# Patient Record
Sex: Female | Born: 1955 | Race: Black or African American | Hispanic: No | Marital: Married | Smoking: Never smoker
Health system: Southern US, Academic
[De-identification: ages and names within clinical notes are randomized; demographics above are authoritative.]

## PROBLEM LIST (undated history)

## (undated) DIAGNOSIS — G4733 Obstructive sleep apnea (adult) (pediatric): Secondary | ICD-10-CM

## (undated) DIAGNOSIS — I872 Venous insufficiency (chronic) (peripheral): Secondary | ICD-10-CM

## (undated) DIAGNOSIS — I38 Endocarditis, valve unspecified: Secondary | ICD-10-CM

## (undated) HISTORY — DX: Obstructive sleep apnea (adult) (pediatric): G47.33

## (undated) HISTORY — PX: HX CARPAL TUNNEL RELEASE: SHX101

## (undated) HISTORY — DX: Endocarditis, valve unspecified: I38

## (undated) HISTORY — PX: HX HERNIA REPAIR: SHX51

## (undated) HISTORY — PX: HX KNEE REPLACMENT: SHX125

## (undated) HISTORY — DX: Venous insufficiency (chronic) (peripheral): I87.2

## (undated) HISTORY — PX: COLONOSCOPY: SHX174

---

## 1996-01-29 ENCOUNTER — Other Ambulatory Visit (HOSPITAL_COMMUNITY): Payer: Self-pay

## 2009-11-07 DIAGNOSIS — R002 Palpitations: Secondary | ICD-10-CM | POA: Insufficient documentation

## 2009-11-07 DIAGNOSIS — I493 Ventricular premature depolarization: Secondary | ICD-10-CM | POA: Insufficient documentation

## 2017-01-09 DIAGNOSIS — E785 Hyperlipidemia, unspecified: Secondary | ICD-10-CM | POA: Insufficient documentation

## 2017-01-09 DIAGNOSIS — N83209 Unspecified ovarian cyst, unspecified side: Secondary | ICD-10-CM | POA: Insufficient documentation

## 2017-01-09 DIAGNOSIS — M5136 Other intervertebral disc degeneration, lumbar region: Secondary | ICD-10-CM | POA: Insufficient documentation

## 2017-01-09 DIAGNOSIS — G5760 Lesion of plantar nerve, unspecified lower limb: Secondary | ICD-10-CM | POA: Insufficient documentation

## 2017-01-09 DIAGNOSIS — M75102 Unspecified rotator cuff tear or rupture of left shoulder, not specified as traumatic: Secondary | ICD-10-CM | POA: Insufficient documentation

## 2017-01-09 DIAGNOSIS — R739 Hyperglycemia, unspecified: Secondary | ICD-10-CM | POA: Insufficient documentation

## 2017-01-09 DIAGNOSIS — M75101 Unspecified rotator cuff tear or rupture of right shoulder, not specified as traumatic: Secondary | ICD-10-CM | POA: Insufficient documentation

## 2018-06-03 DIAGNOSIS — M170 Bilateral primary osteoarthritis of knee: Secondary | ICD-10-CM | POA: Insufficient documentation

## 2019-02-04 DIAGNOSIS — G4733 Obstructive sleep apnea (adult) (pediatric): Secondary | ICD-10-CM | POA: Insufficient documentation

## 2019-02-04 DIAGNOSIS — G4730 Sleep apnea, unspecified: Secondary | ICD-10-CM | POA: Insufficient documentation

## 2019-06-24 DIAGNOSIS — M542 Cervicalgia: Secondary | ICD-10-CM | POA: Insufficient documentation

## 2019-06-24 DIAGNOSIS — M7540 Impingement syndrome of unspecified shoulder: Secondary | ICD-10-CM | POA: Insufficient documentation

## 2019-06-24 DIAGNOSIS — G8929 Other chronic pain: Secondary | ICD-10-CM | POA: Insufficient documentation

## 2019-06-24 DIAGNOSIS — M4722 Other spondylosis with radiculopathy, cervical region: Secondary | ICD-10-CM | POA: Insufficient documentation

## 2019-06-24 DIAGNOSIS — Z96652 Presence of left artificial knee joint: Secondary | ICD-10-CM | POA: Insufficient documentation

## 2019-09-01 DIAGNOSIS — M859 Disorder of bone density and structure, unspecified: Secondary | ICD-10-CM | POA: Insufficient documentation

## 2019-09-01 DIAGNOSIS — Z780 Asymptomatic menopausal state: Secondary | ICD-10-CM | POA: Insufficient documentation

## 2020-05-27 DIAGNOSIS — M75122 Complete rotator cuff tear or rupture of left shoulder, not specified as traumatic: Secondary | ICD-10-CM | POA: Insufficient documentation

## 2020-11-25 DIAGNOSIS — M1811 Unilateral primary osteoarthritis of first carpometacarpal joint, right hand: Secondary | ICD-10-CM | POA: Insufficient documentation

## 2020-11-25 DIAGNOSIS — G5602 Carpal tunnel syndrome, left upper limb: Secondary | ICD-10-CM | POA: Insufficient documentation

## 2021-03-23 IMAGING — MR MRI BRAIN WITHOUT AND WITH CONTRAST
10 of 12 series · 37 of 48 positions shown · IV contrast (gadolinium)
Comparison: None available.

﻿EXAM:  MRI BRAIN W/O CONTRAST
INDICATION: Memory impairment.
TECHNIQUE: Multiplanar, multisequential MRI of the brain was performed. Patient refused gadolinium contrast.

[Series 9: DWI · axial · 5.0mm · 1.35mm/px · z∈[-34,+92]mm · 11 of 88 slices shown (1 of 3)]
[im 1/88]
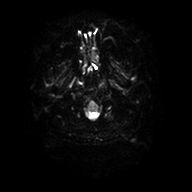
[im 9/88]
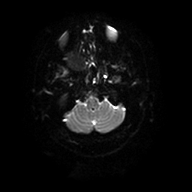
[im 18/88]
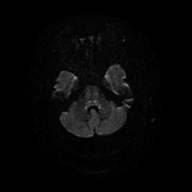
[im 27/88]
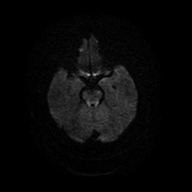
[im 35/88]
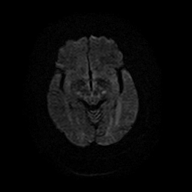
[im 44/88]
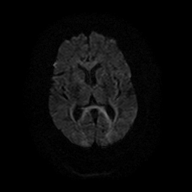
[im 53/88]
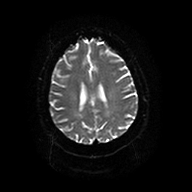
[im 61/88]
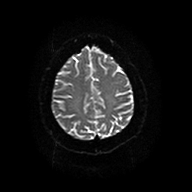
[im 70/88]
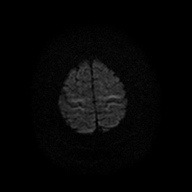
[im 79/88]
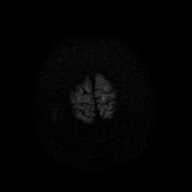
[im 88/88]
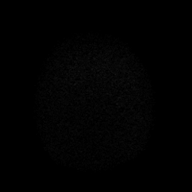

[Series 10: DWI · axial · 5.0mm · 1.35mm/px · z∈[-34,+92]mm · 3 of 22 slices shown (2 of 3)]
[im 1/22]
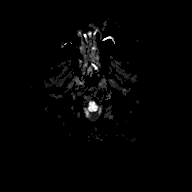
[im 11/22]
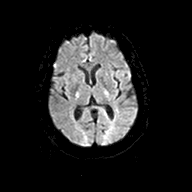
[im 22/22]
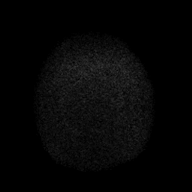

[Series 11: DWI · axial · 5.0mm · 1.35mm/px · z∈[-34,+92]mm · 3 of 22 slices shown (3 of 3)]
[im 1/22]
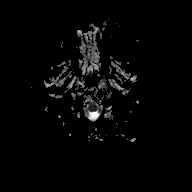
[im 11/22]
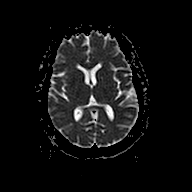
[im 22/22]
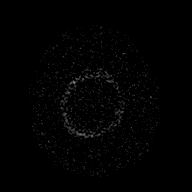

[Series 12: FLAIR · sagittal · 4.0mm · 0.75mm/px · 3 of 26 slices shown (1 of 2)]
[im 1/26]
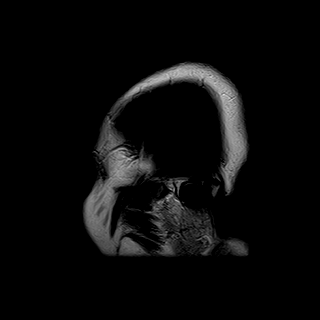
[im 13/26]
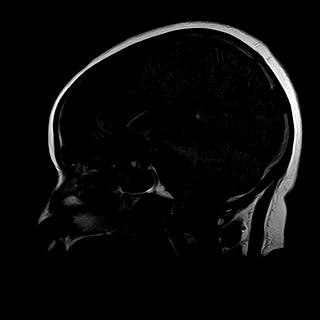
[im 26/26]
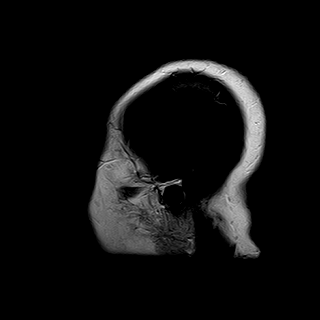

[Series 13: FLAIR · axial · 5.0mm · 0.43mm/px · z∈[-39,+105]mm · 3 of 25 slices shown (2 of 2)]
[im 1/25]
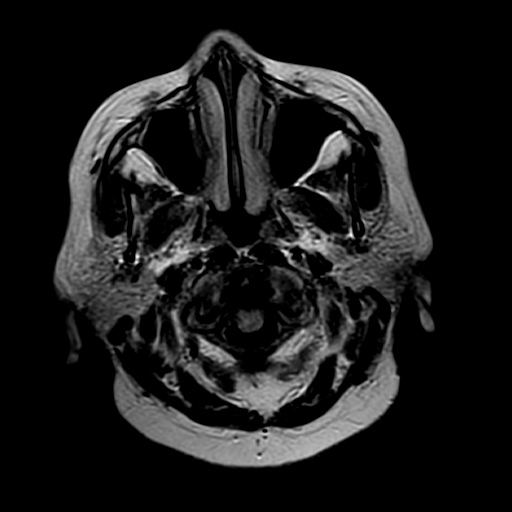
[im 13/25]
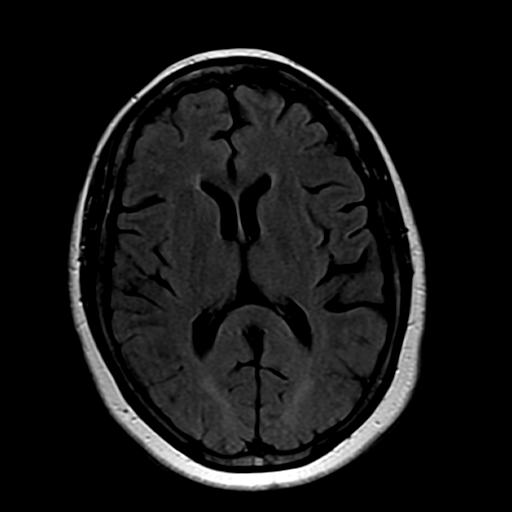
[im 25/25]
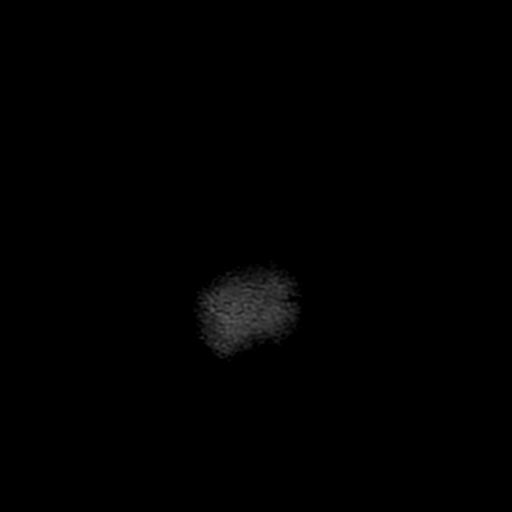

[Series 14: T2 · axial · 5.0mm · 0.43mm/px · z∈[-39,+105]mm · 3 of 25 slices shown (1 of 2)]
[im 1/25]
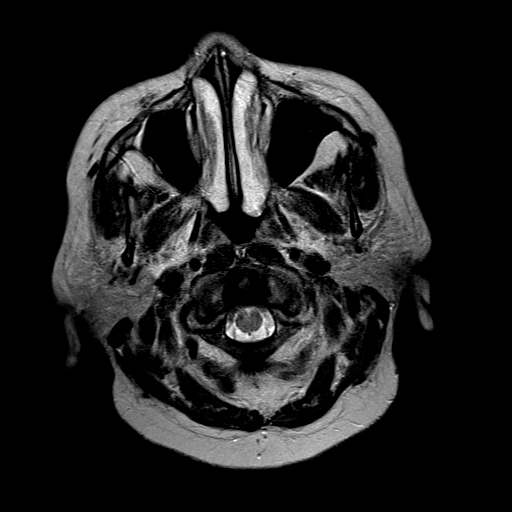
[im 13/25]
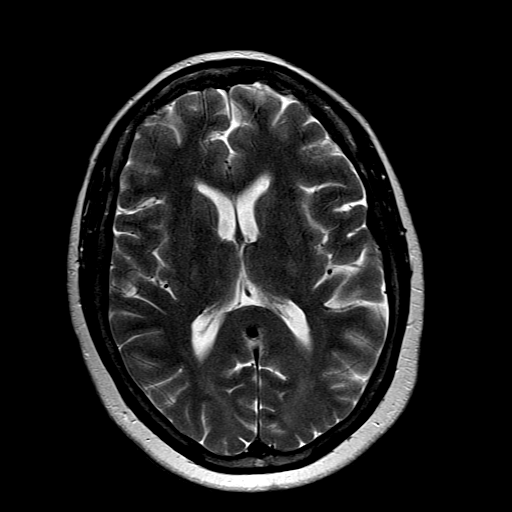
[im 25/25]
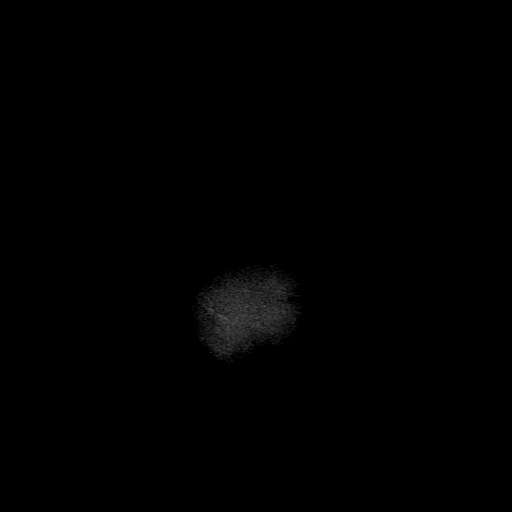

[Series 15: T1 · axial · 5.0mm · 0.43mm/px · z∈[-39,+105]mm · 3 of 25 slices shown]
[im 1/25]
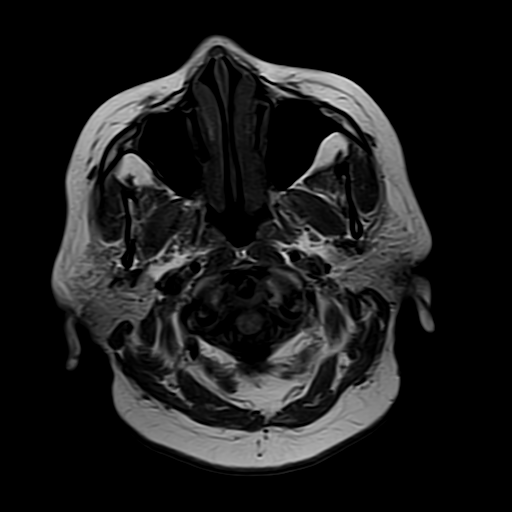
[im 13/25]
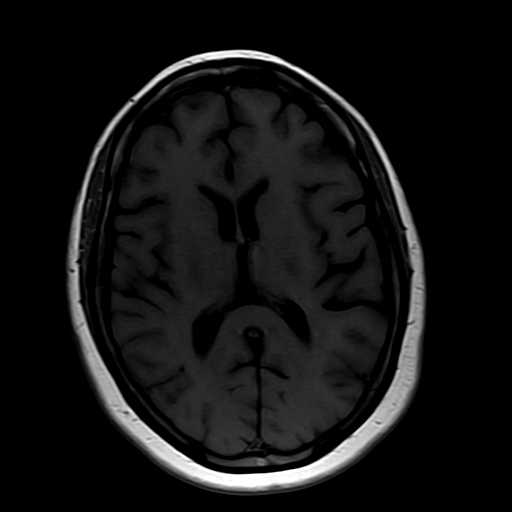
[im 25/25]
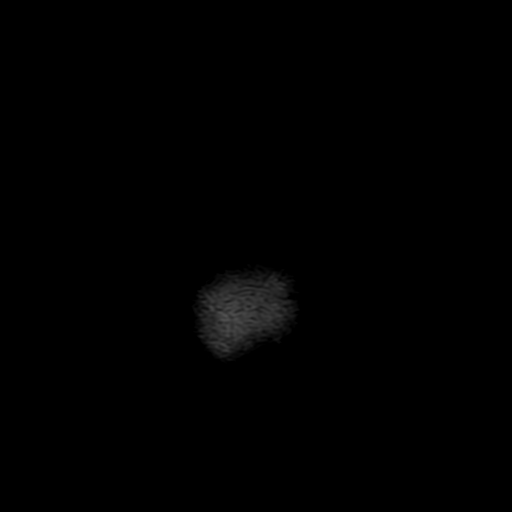

[Series 17: T2 · coronal · 6.0mm · 0.43mm/px · 3 of 24 slices shown (2 of 2)]
[im 1/24]
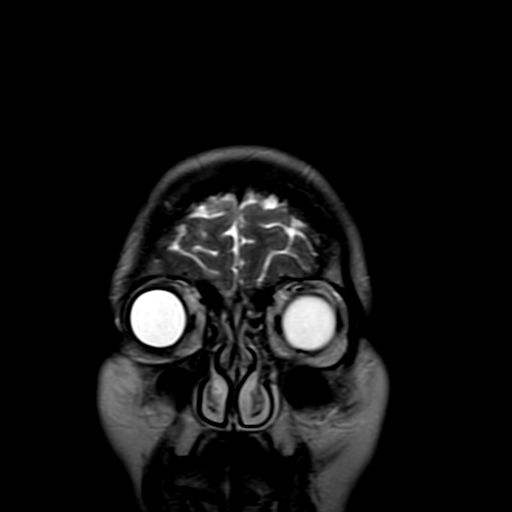
[im 12/24]
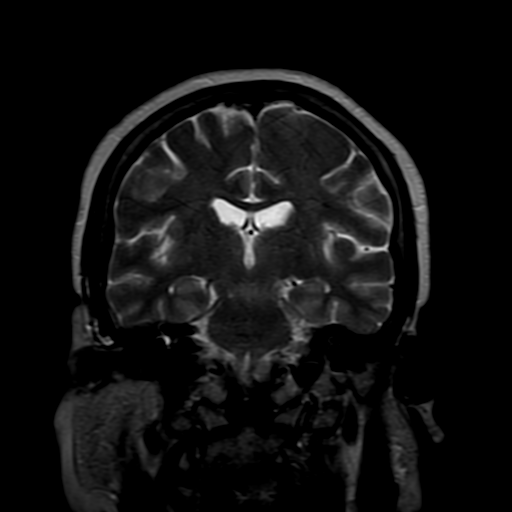
[im 24/24]
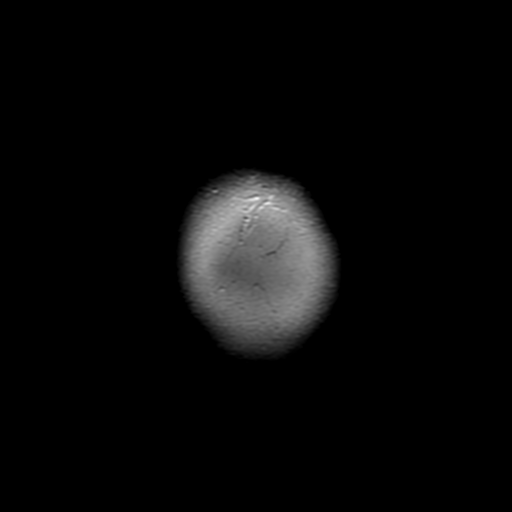

[Series 18: T1 fat-sat · coronal · 6.0mm · 0.57mm/px · 3 of 24 slices shown (1 of 2)]
[im 1/24]
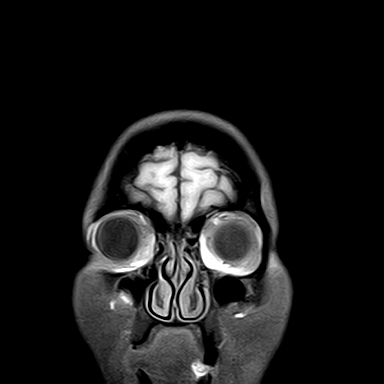
[im 12/24]
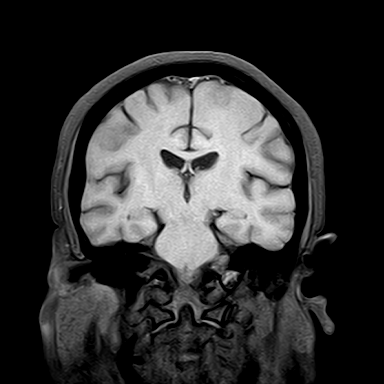
[im 24/24]
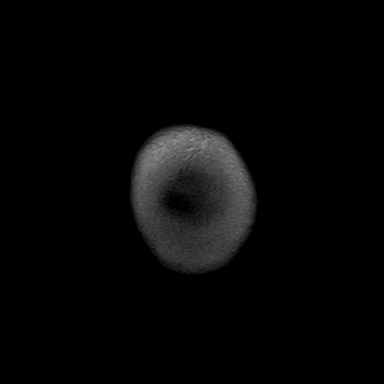

[Series 19: T1 fat-sat · axial · 5.0mm · 0.43mm/px · z∈[-39,+33]mm · 2 of 25 slices shown (2 of 2)]
[im 1/25]
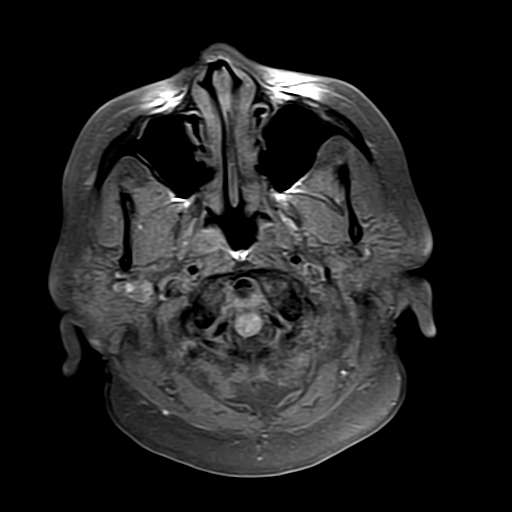
[im 13/25]
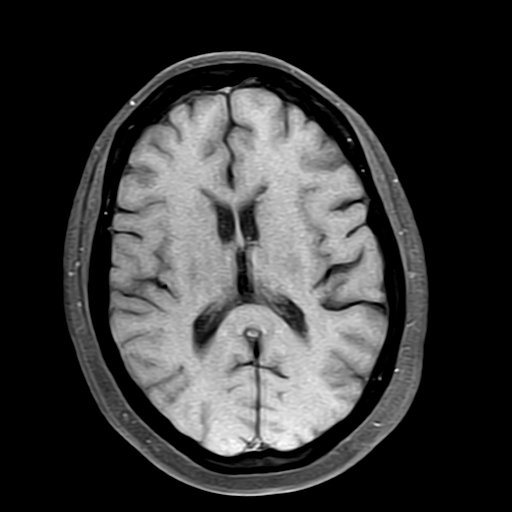

[37 of 48 positions shown; findings below may reference images not displayed]

FINDINGS: Ventricular and sulcal size is normal for the patient's age. There is no mass effect, midline shift or intracranial hemorrhage. There is no evidence of acute infarction or prior microhemorrhages. Skull base flow voids and basal cisterns are patent. Sagittal survey of midline structures is unremarkable. There are no extra-axial fluid collections. Visualized paranasal sinuses, mastoid air cells and orbital contents are unremarkable.
IMPRESSION: Unremarkable exam.

## 2021-04-28 IMAGING — CR XRAY SHOULDER MINIMUM 2 VIEW LT
1 series · 2 of 2 positions shown · non-contrast
Comparison: None available.

﻿EXAM:  XRAY SHOULDER MINIMUM 2 VIEW LT
INDICATION: Pain.

[Series 1: view not recorded · 0.17mm/px · 2 of 2 slices shown]
[im 1/2]
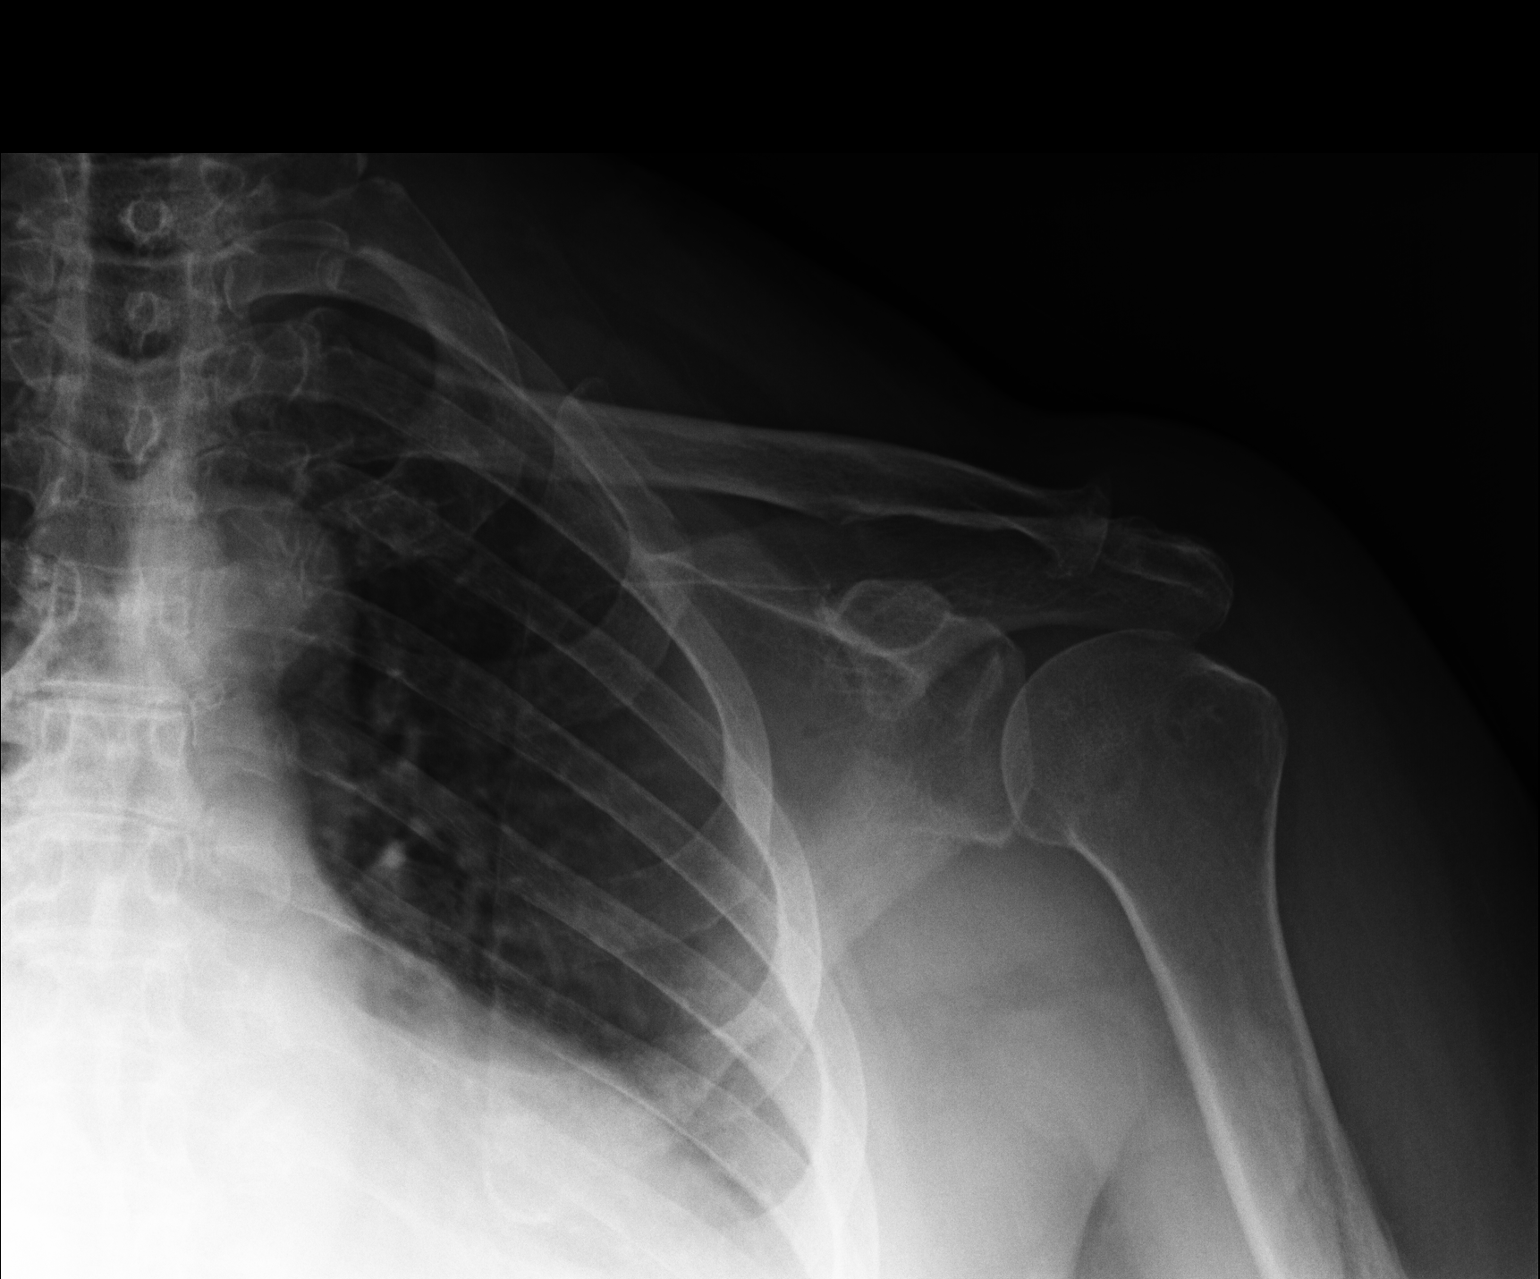
[im 2/2]
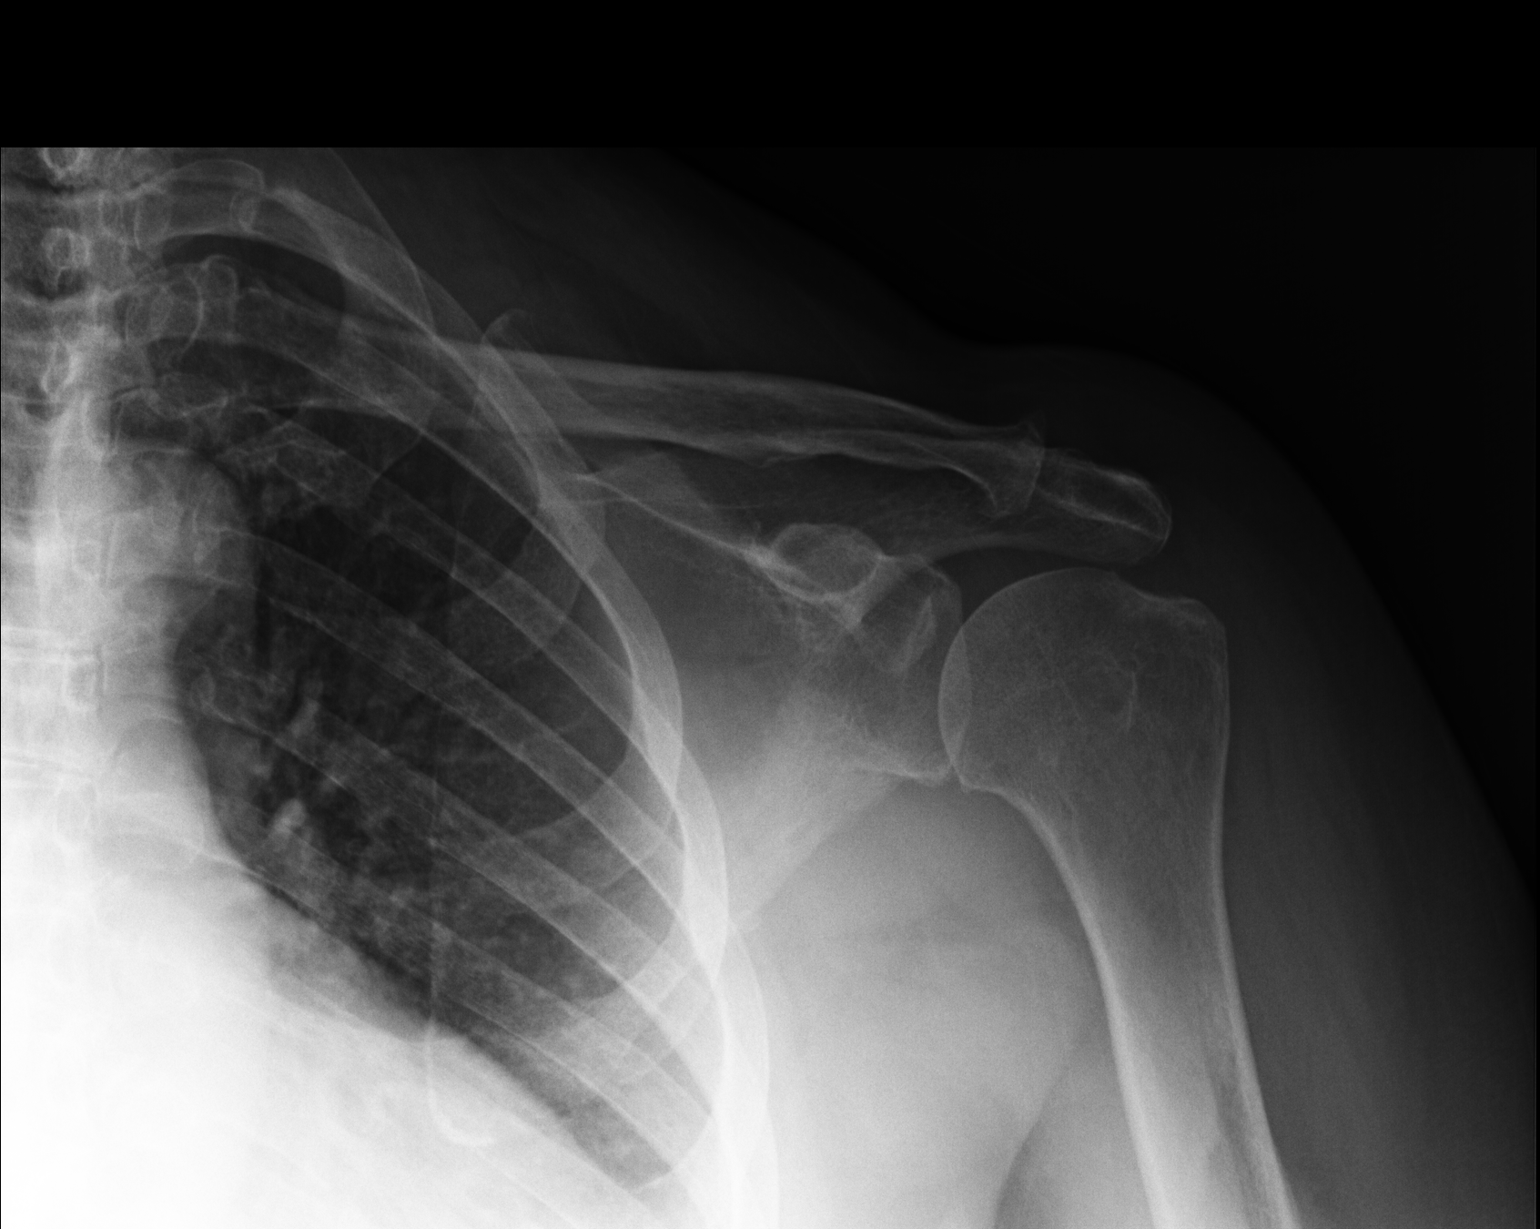

[2 of 2 positions shown; findings below may reference images not displayed]

FINDINGS: There is no acute fracture or subluxation. Glenohumeral articulation is well maintained. There is mild-to-moderate acromioclavicular joint osteoarthritis. Visualized lung is clear. Surrounding soft tissues are unremarkable.
IMPRESSION: Mild-to-moderate acromioclavicular joint osteoarthritis.

## 2021-04-28 IMAGING — CR XRAY HAND MINIMUM 3 VIEWS LT
1 series · 3 of 3 positions shown · non-contrast
Comparison: None available.

﻿EXAM:  [id number]      XRAY HAND MINIMUM 3 VIEWS RT,XRAY HAND MINIMUM 3 VIEWS LT
INDICATION: Pain both hands.

[Series 1: view not recorded · 0.17mm/px · 3 of 3 slices shown]
[im 1/3]
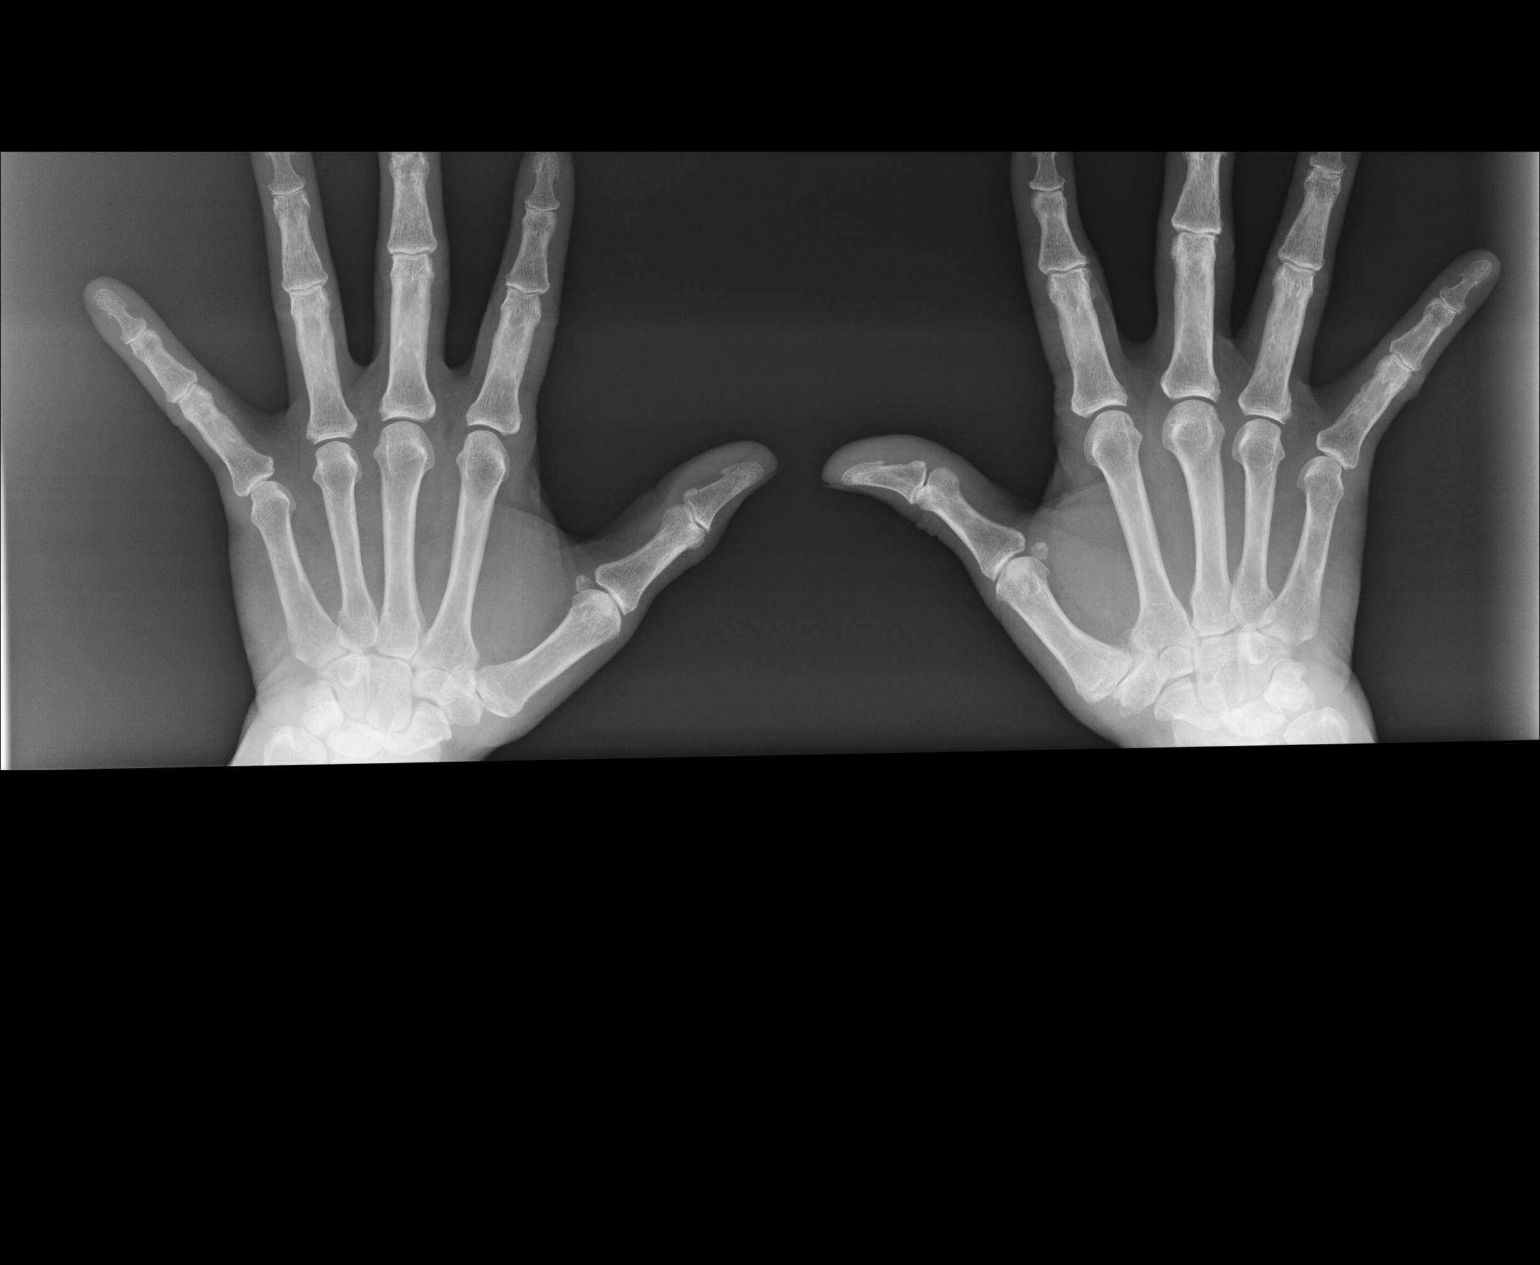
[im 2/3]
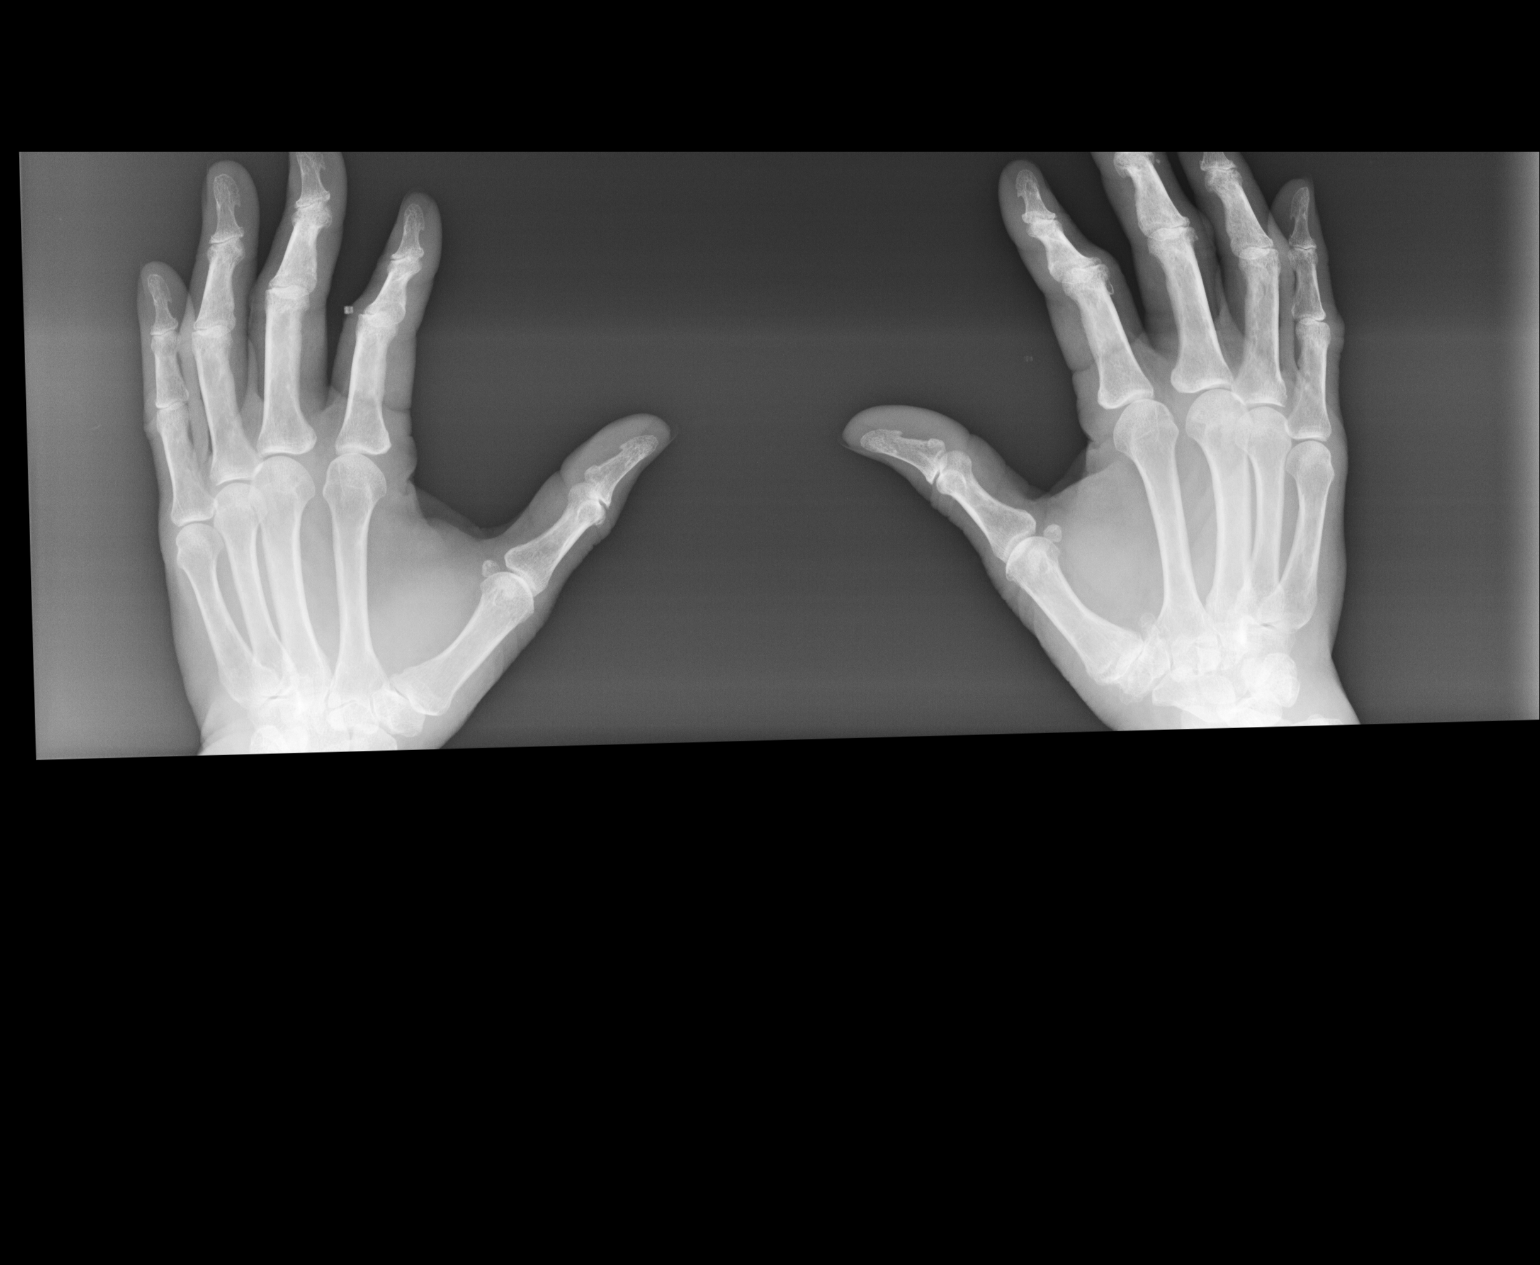
[im 3/3]
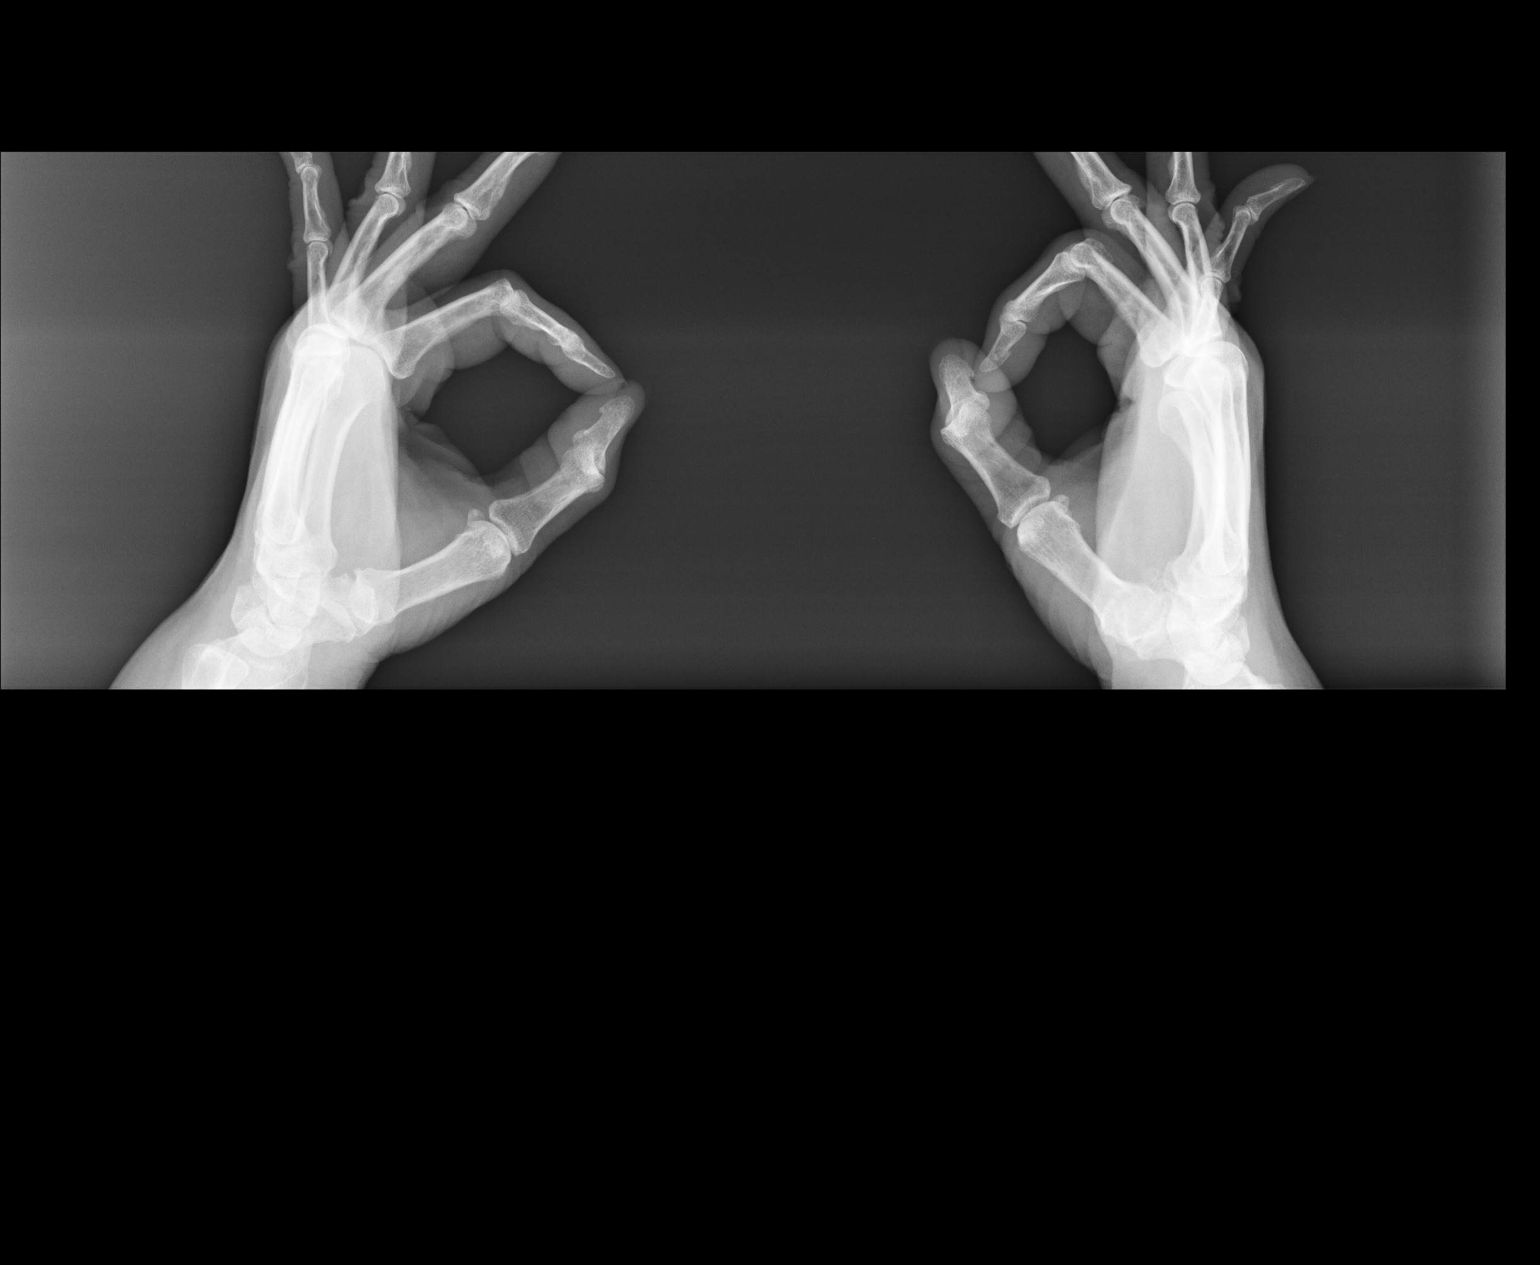

[3 of 3 positions shown; findings below may reference images not displayed]

FINDINGS: There is no acute fracture or subluxation. There is advanced osteoarthritis of the right 1st carpometacarpal joint. Mild-to-moderate osteoarthritis of the 2nd proximal interphalangeal joints is also noted bilaterally. There is also mild osteoarthritis of the 3rd and 4th distal interphalangeal joints bilaterally. No definite erosive changes seen on either side. Surrounding soft tissues are unremarkable.
IMPRESSION: Osteoarthritis of both hands as detailed above.

## 2021-04-28 IMAGING — CR XRAY CERVICAL SPINE [DATE] VIEWS
1 series · 6 of 6 positions shown · non-contrast
Comparison: None available.

﻿EXAM:  [id number]      XRAY CERVICAL SPINE [DATE] VIEWS
INDICATION: Chronic neck pain.

[Series 1: view not recorded · 0.17mm/px · 6 of 6 slices shown]
[im 1/6]
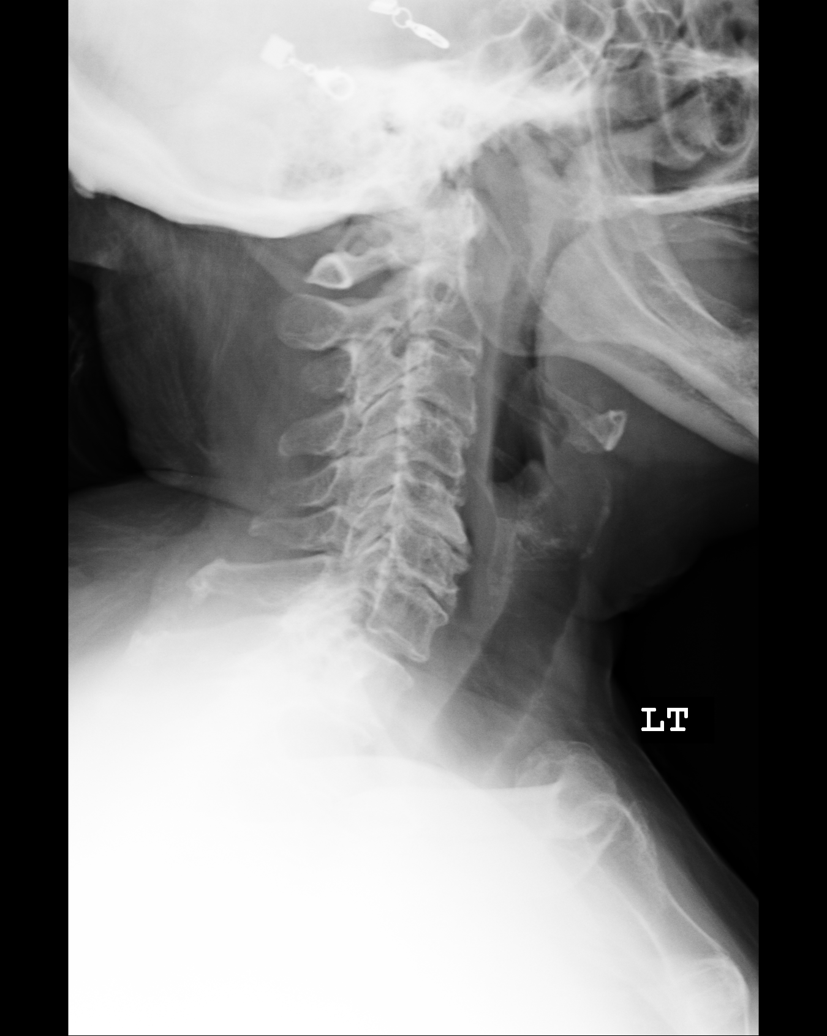
[im 2/6]
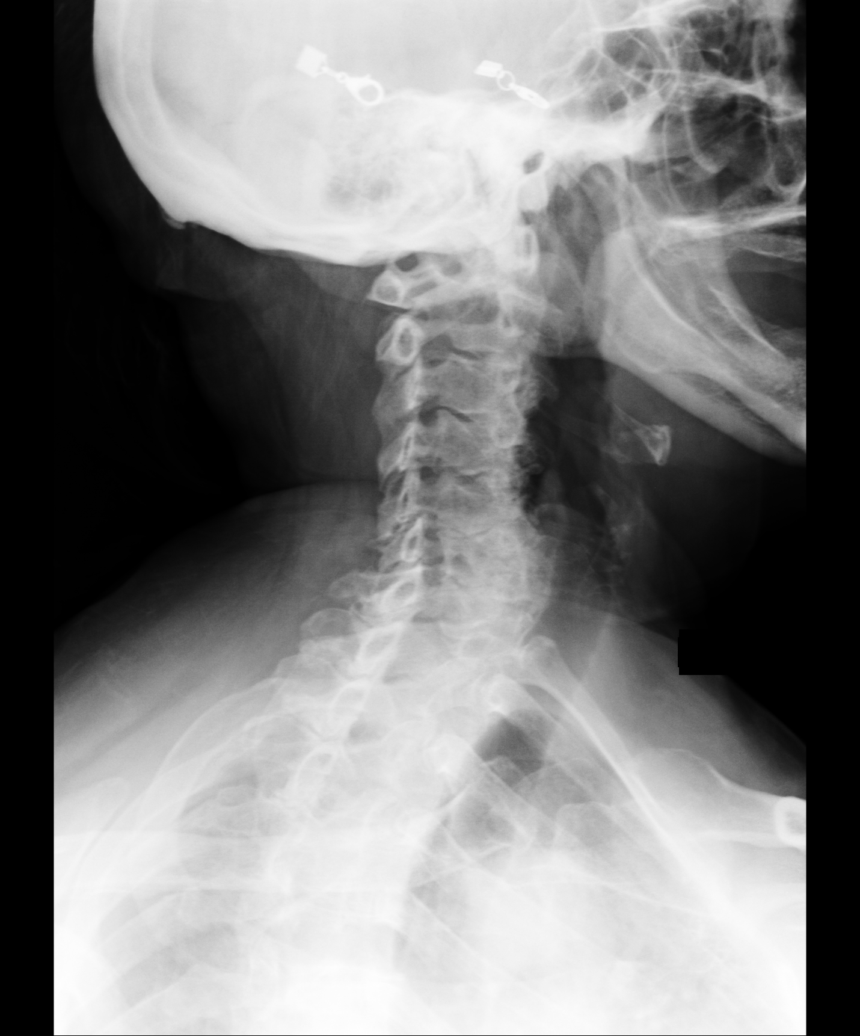
[im 3/6]
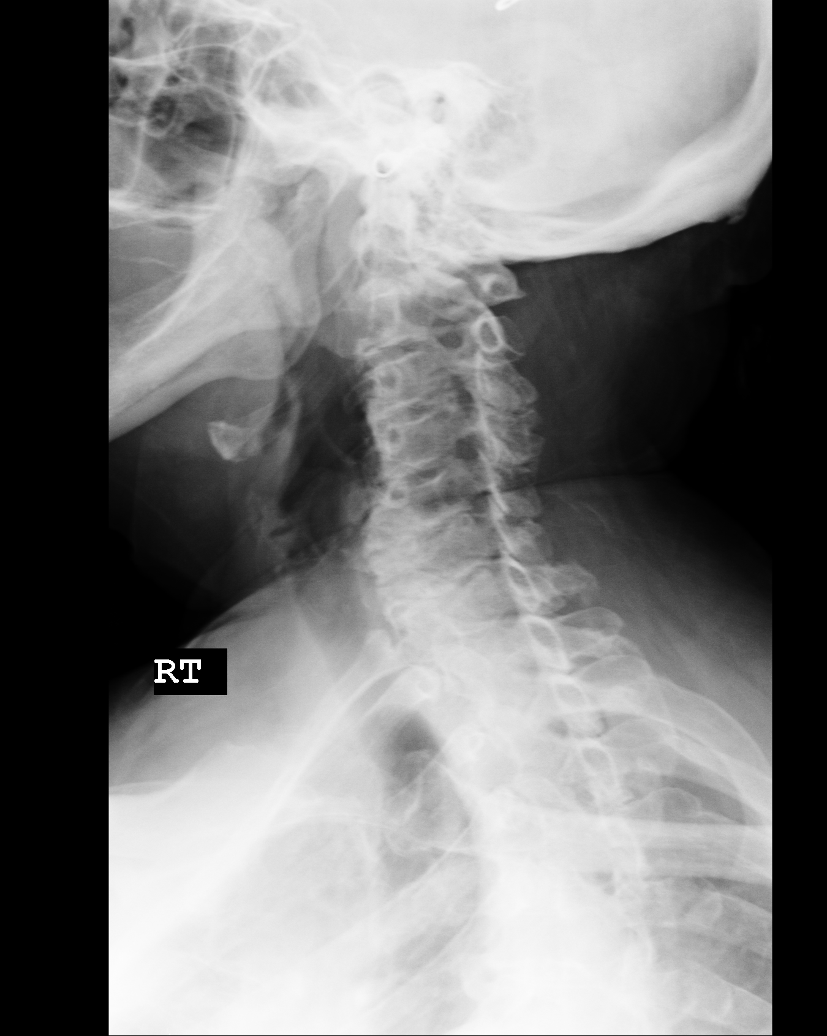
[im 4/6]
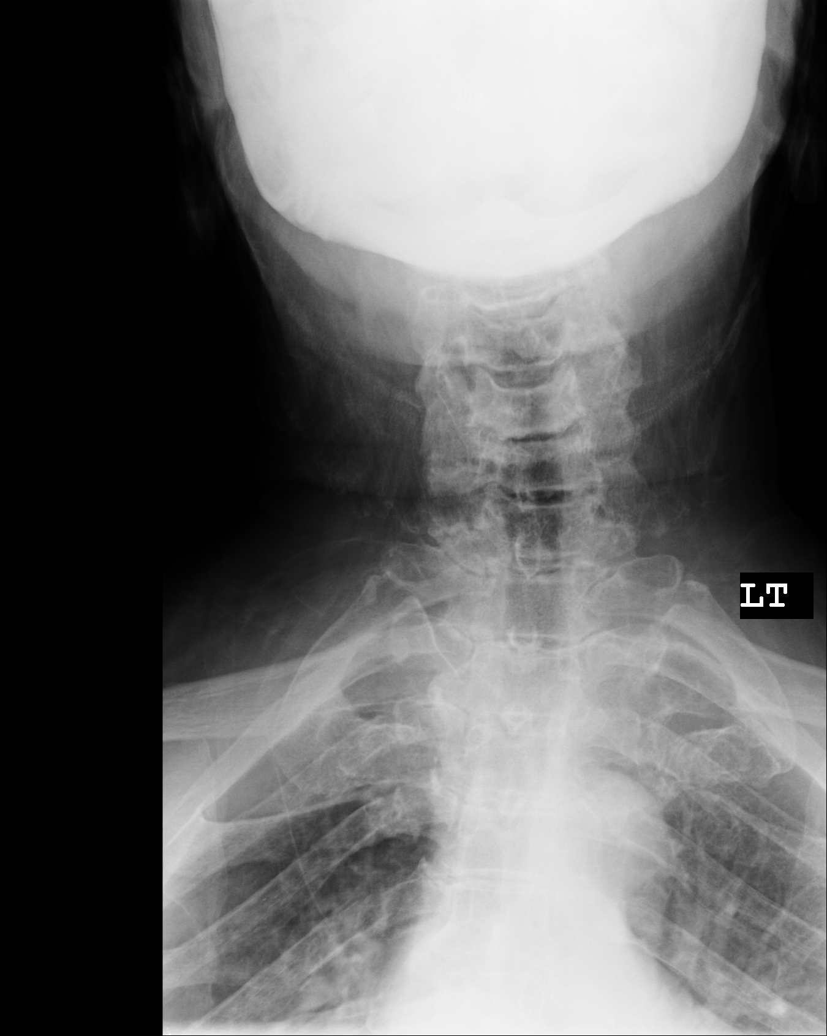
[im 5/6]
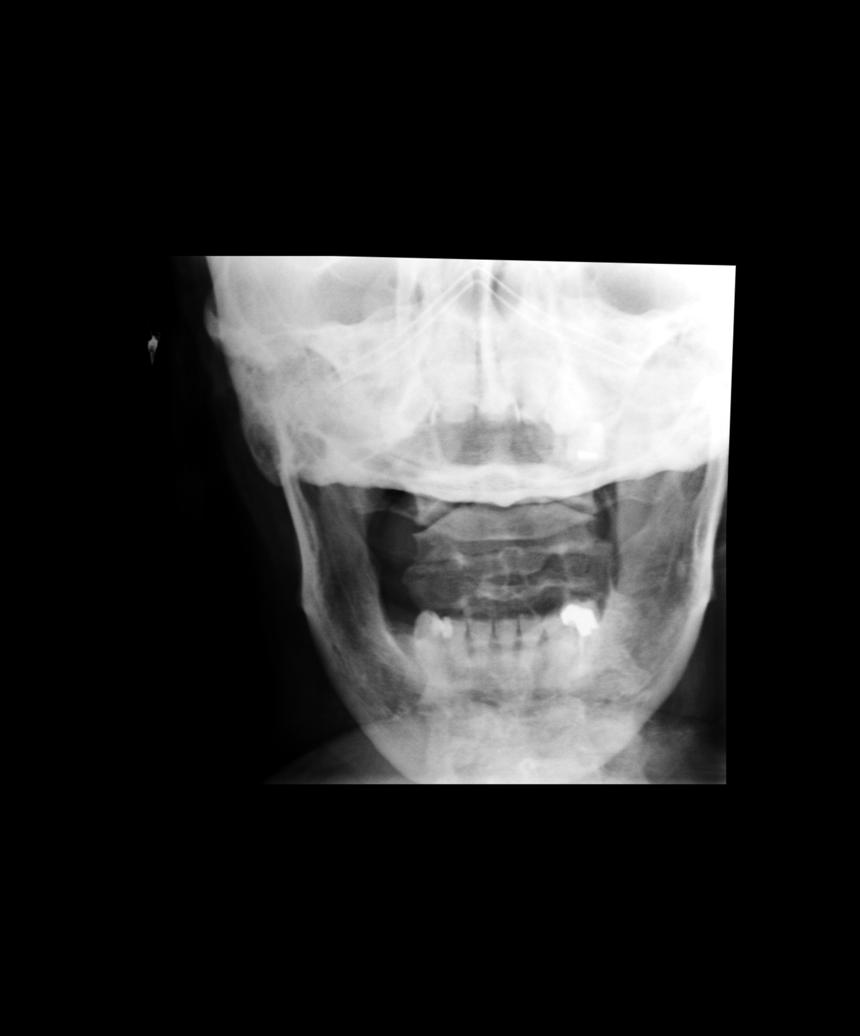
[im 6/6]
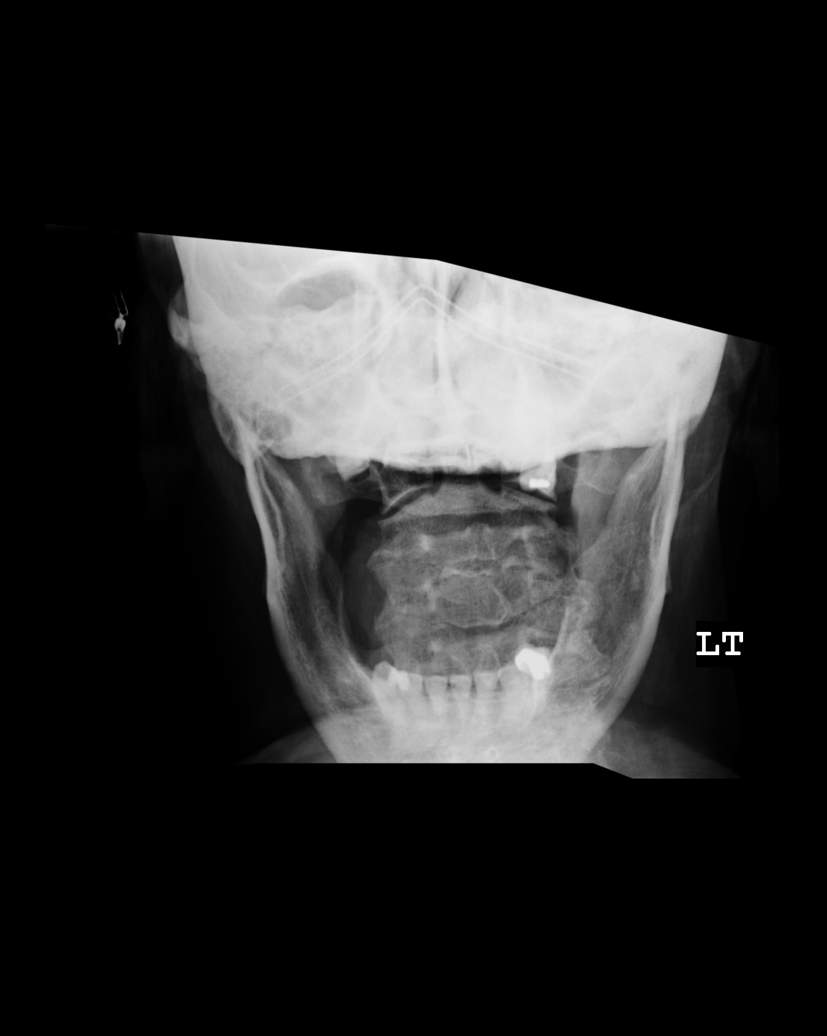

[6 of 6 positions shown; findings below may reference images not displayed]

FINDINGS: There is no acute fracture or subluxation. There is moderate to severe degenerative disc disease at C5-6 and C6-7 levels. Mild disc space narrowing is also noted at C2-3 and C3-4 levels. There is also mild-to-moderate neural foraminal stenosis at multiple levels bilaterally from facet and uncovertebral joint hypertrophy. Atlantoaxial articulation is well maintained. There is no prevertebral soft tissue swelling.
IMPRESSION: Advanced multilevel arthritic changes as detailed above.

## 2021-04-28 IMAGING — CR XRAY PELVIS [DATE] VIEWS
1 series · 1 of 1 positions shown · non-contrast
Comparison: None available.

﻿EXAM:  [id number]      XRAY PELVIS 1/2 VIEWS
INDICATION: Chronic arthritis.

[view not recorded]
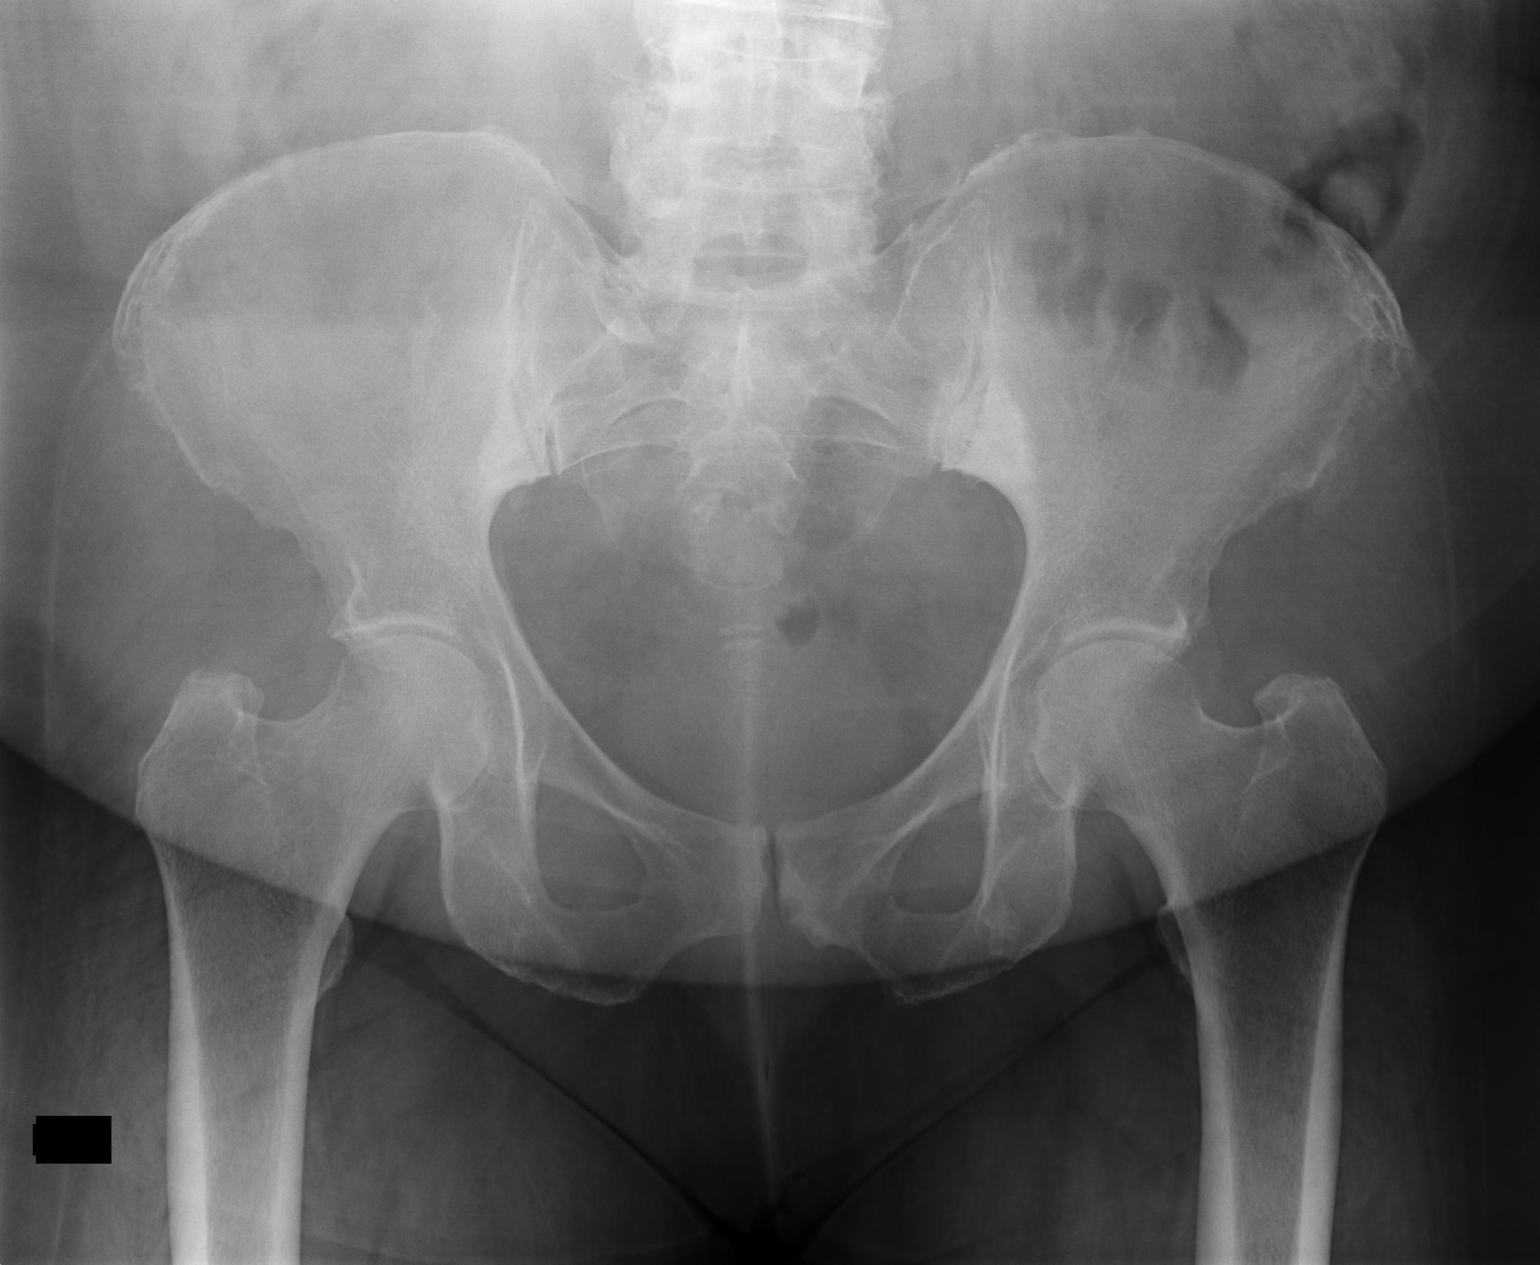

[1 of 1 positions shown; findings below may reference images not displayed]

FINDINGS: There is no acute fracture or subluxation. No significant arthritic changes seen within the hip joints. Surrounding soft tissues are also unremarkable.
IMPRESSION: Unremarkable exam.

## 2021-04-28 IMAGING — CR XRAY SHOULDER MINIMUM 2 VIEW RT
1 series · 2 of 2 positions shown · non-contrast
Comparison: None available.

﻿EXAM:  XRAY SHOULDER MINIMUM 2 VIEW RT
INDICATION: Pain.

[Series 1: view not recorded · 0.17mm/px · 2 of 2 slices shown]
[im 1/2]
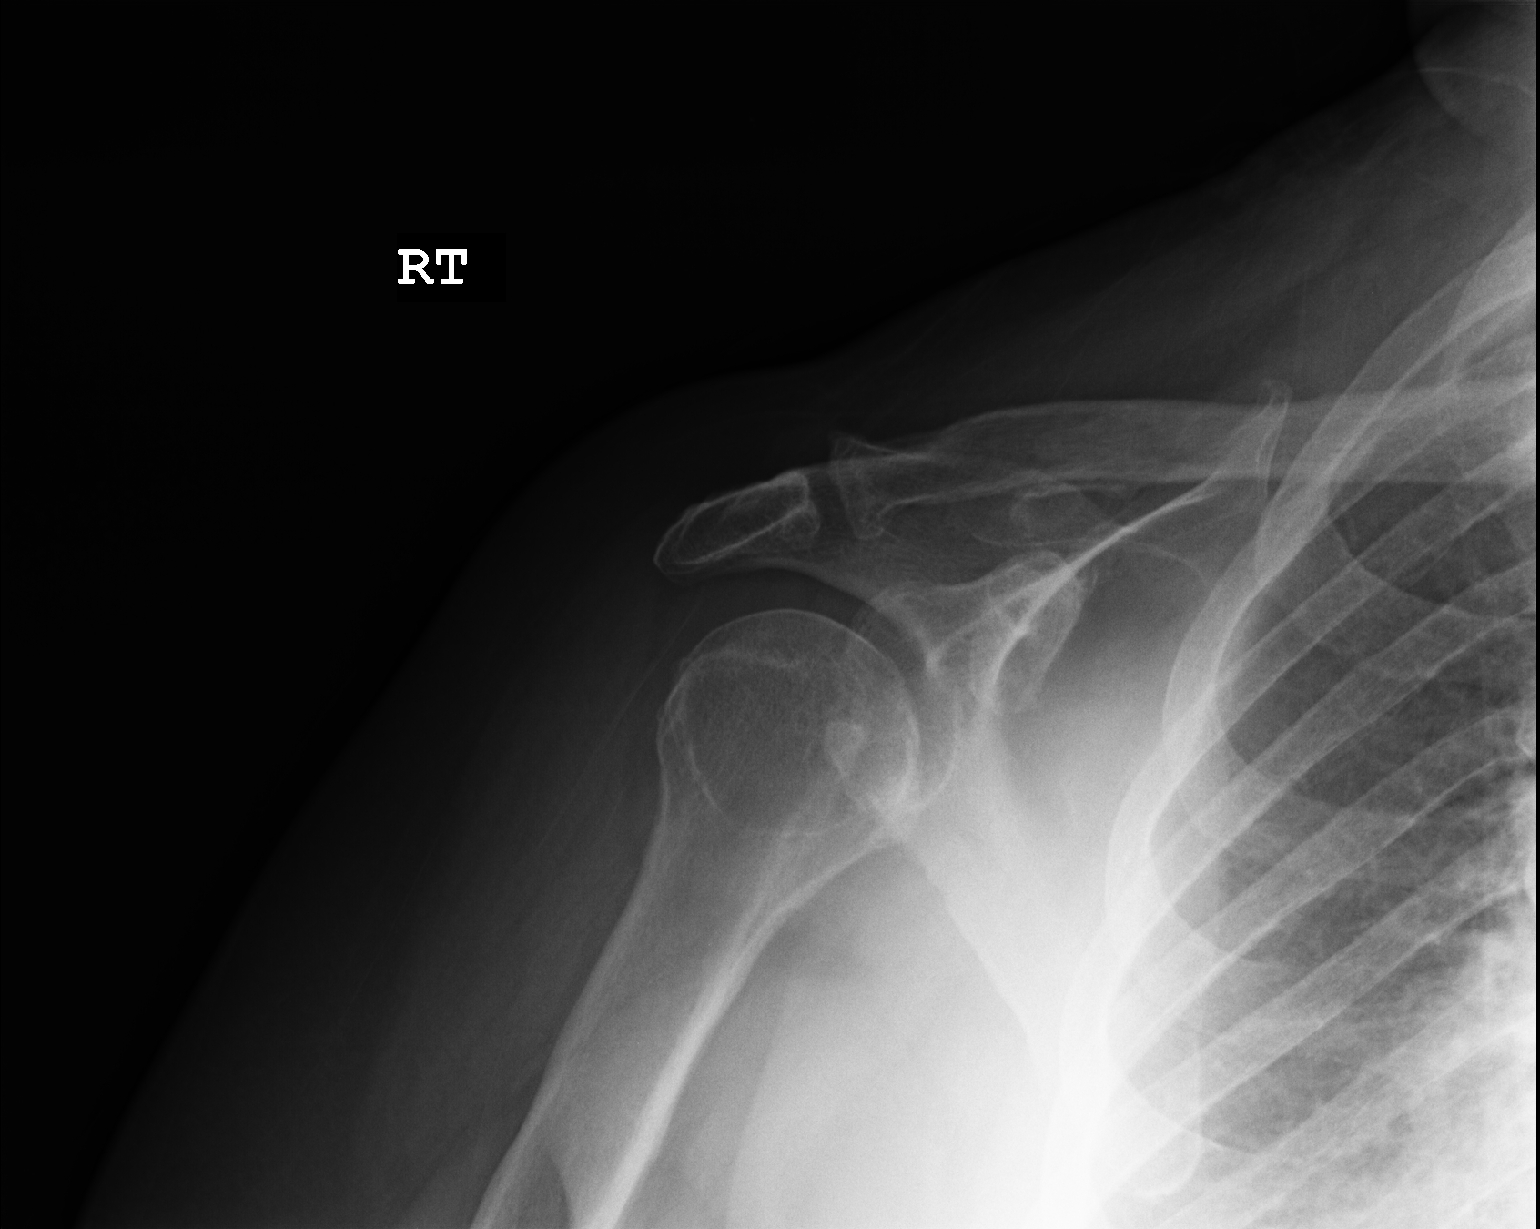
[im 2/2]
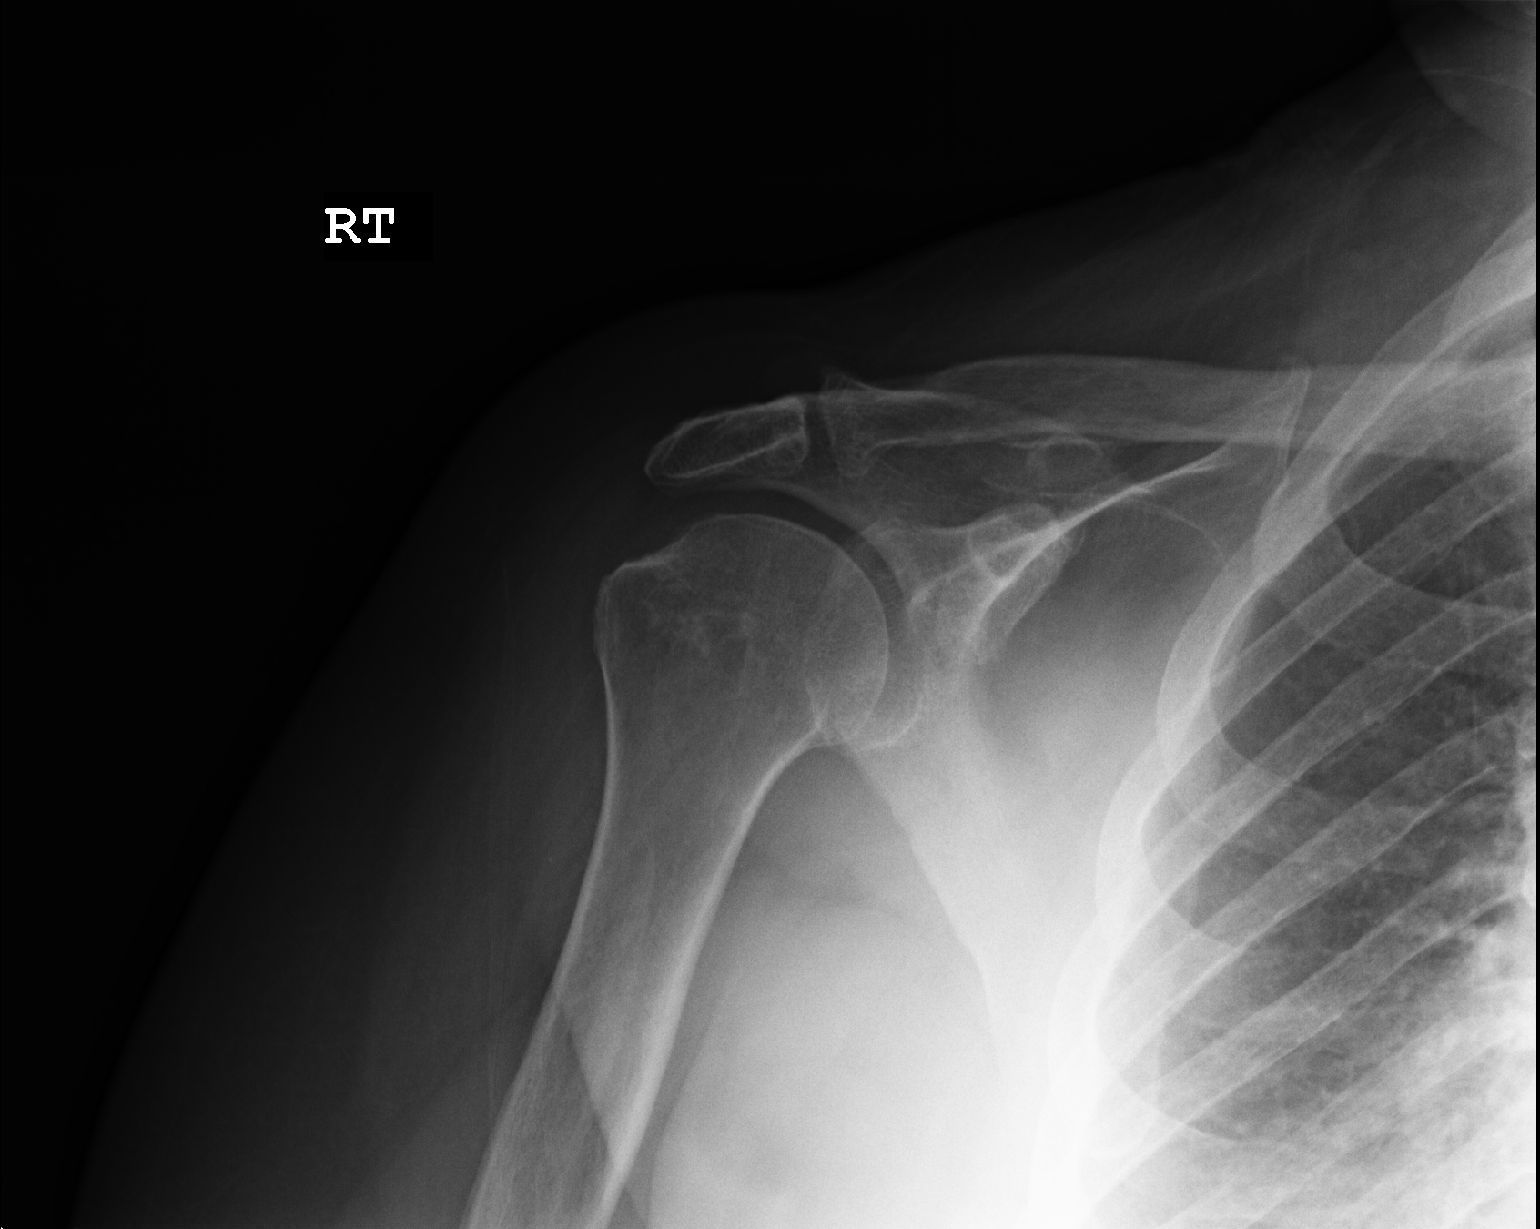

[2 of 2 positions shown; findings below may reference images not displayed]

FINDINGS: There is no acute fracture or subluxation. Glenohumeral articulation is well maintained. There is mild acromioclavicular joint osteoarthritis. Visualized lung is clear. Surrounding soft tissues are unremarkable.
IMPRESSION: Mild acromioclavicular joint osteoarthritis.

## 2021-04-28 IMAGING — CR XRAY FOOT MINIMUM 3 VIEWS LT
1 series · 3 of 3 positions shown · non-contrast
Comparison: None available.

﻿EXAM:  [id number]      XRAY FOOT MINIMUM 3 VIEWS RT,XRAY FOOT MINIMUM 3 VIEWS LT
INDICATION: Pain both feet.

[Series 1: view not recorded · 0.17mm/px · 3 of 3 slices shown]
[im 1/3]
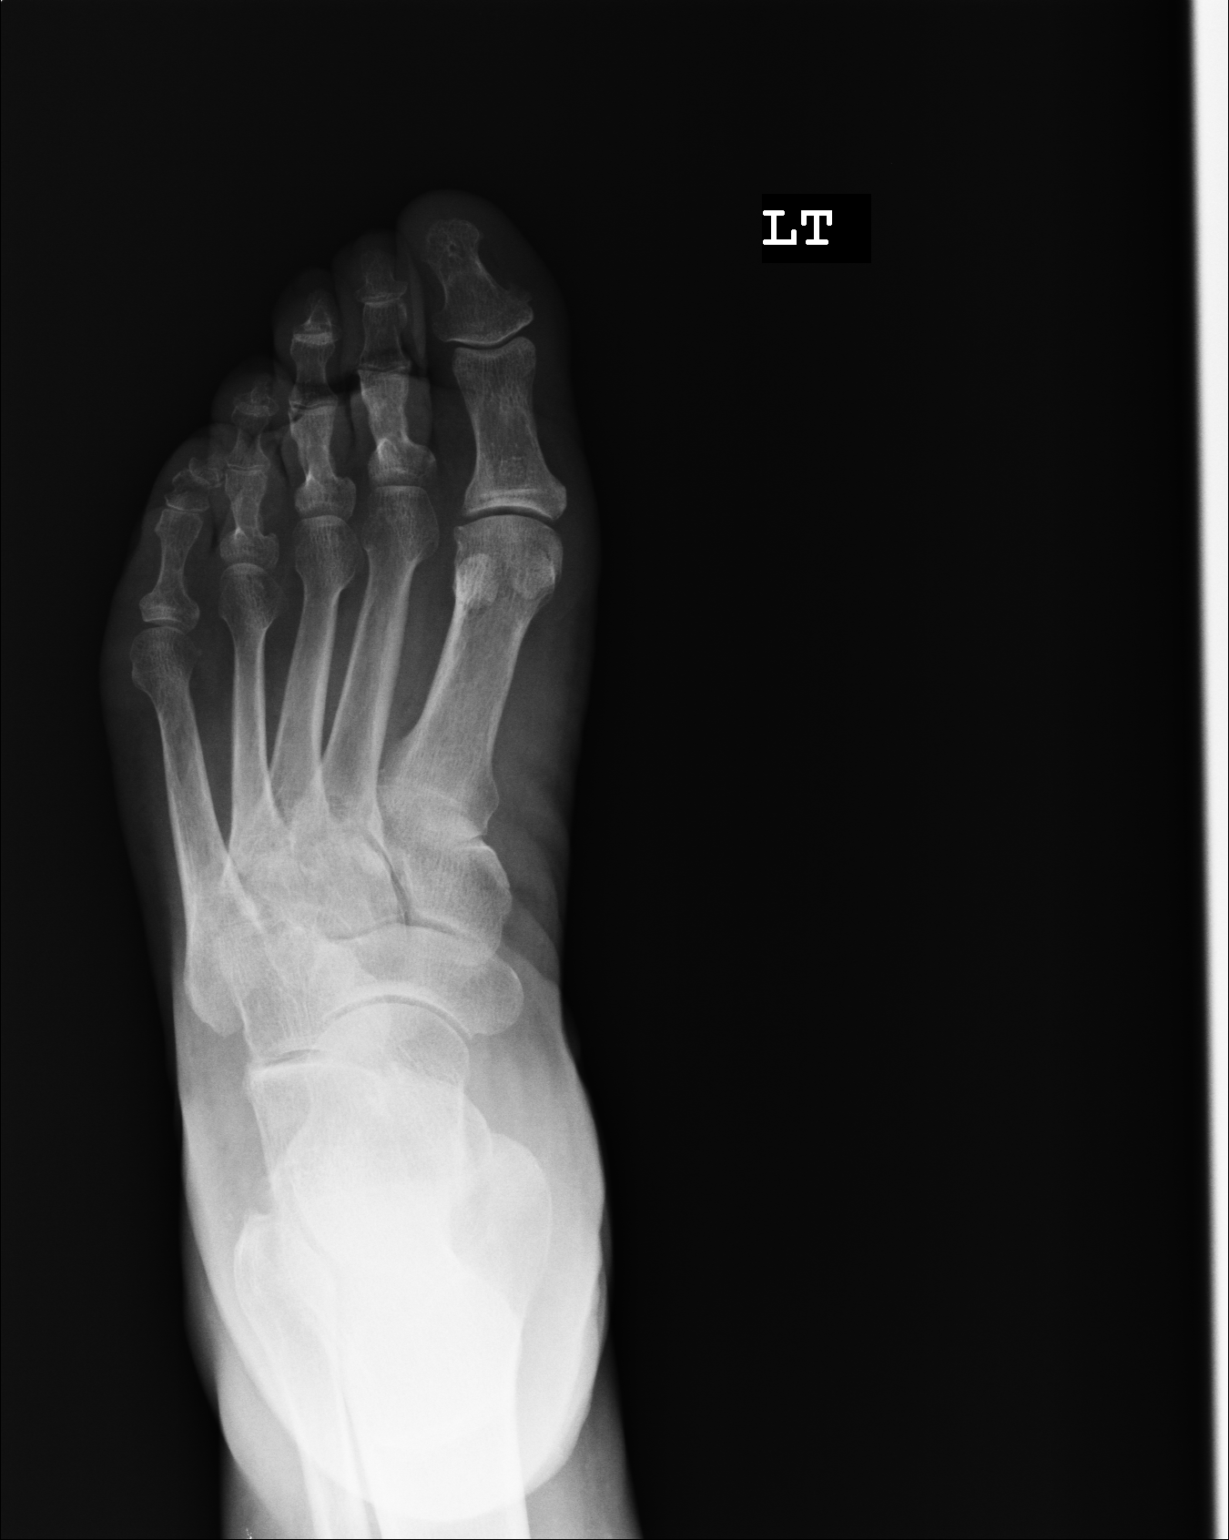
[im 2/3]
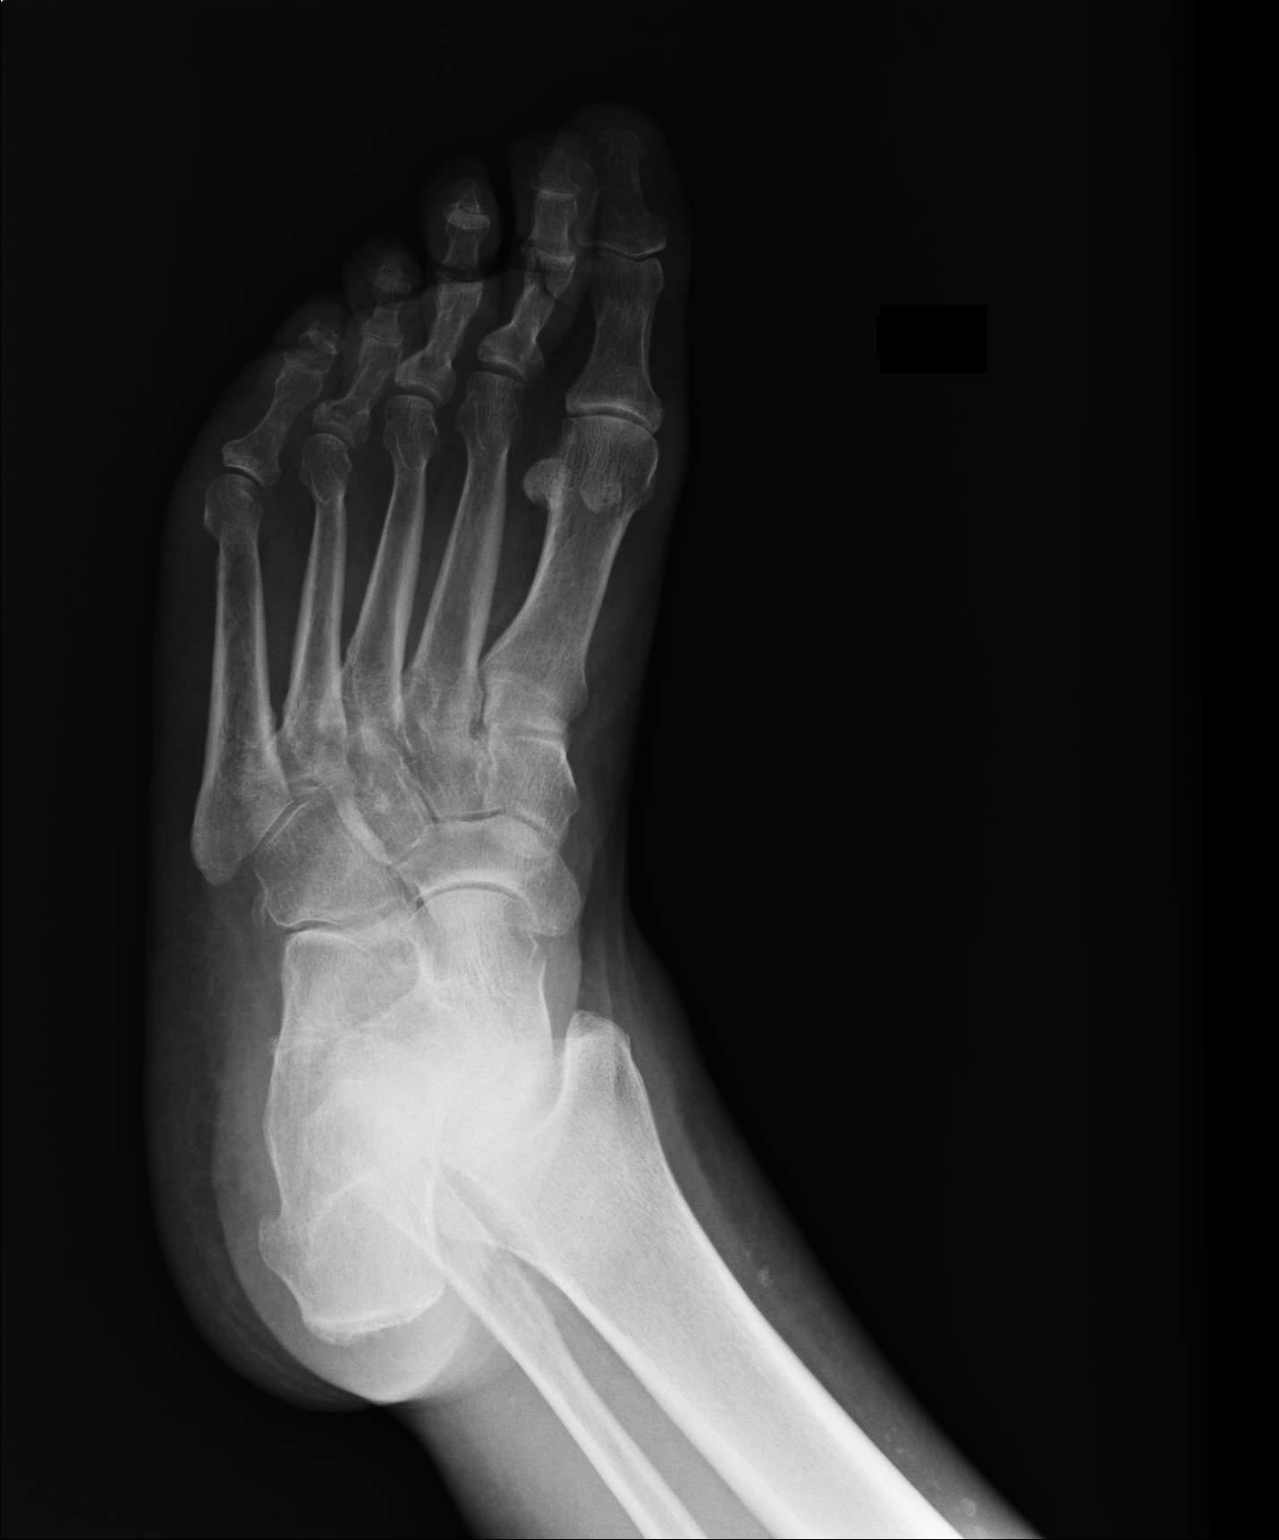
[im 3/3]
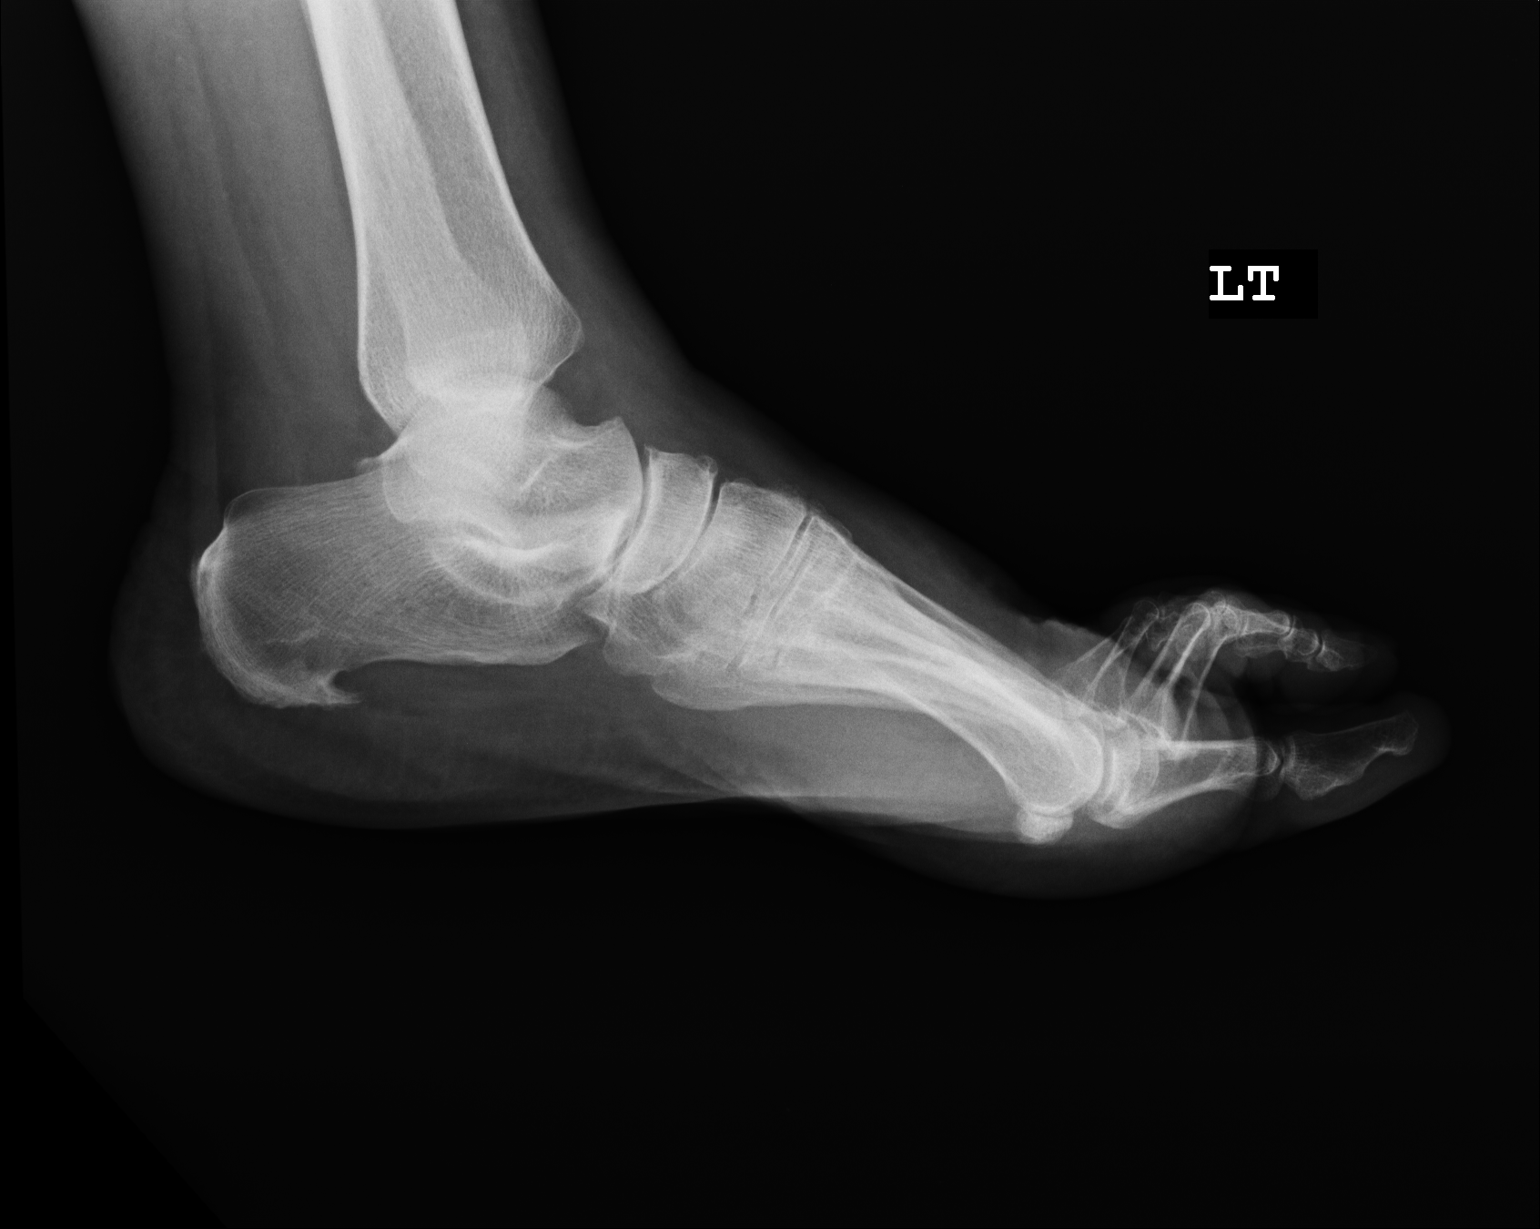

[3 of 3 positions shown; findings below may reference images not displayed]

FINDINGS: There is no acute fracture or subluxation. Mild osteoarthritis of the 1st metatarsophalangeal joint and midfoot is noted bilaterally. Bilateral heel spurs are also seen. There are no definite erosive changes. Surrounding soft tissues are unremarkable.
IMPRESSION: Osteoarthritis of both feet as detailed above.

## 2021-04-28 IMAGING — CR XRAY LUMBAR SPINE [DATE] VIEWS
1 series · 5 of 5 positions shown · non-contrast
Comparison: None available.

﻿EXAM:  [id number]      XRAY LUMBAR SPINE [DATE] VIEWS
INDICATION: Chronic lower back pain.

[Series 1: view not recorded · 0.17mm/px · 5 of 5 slices shown]
[im 1/5]
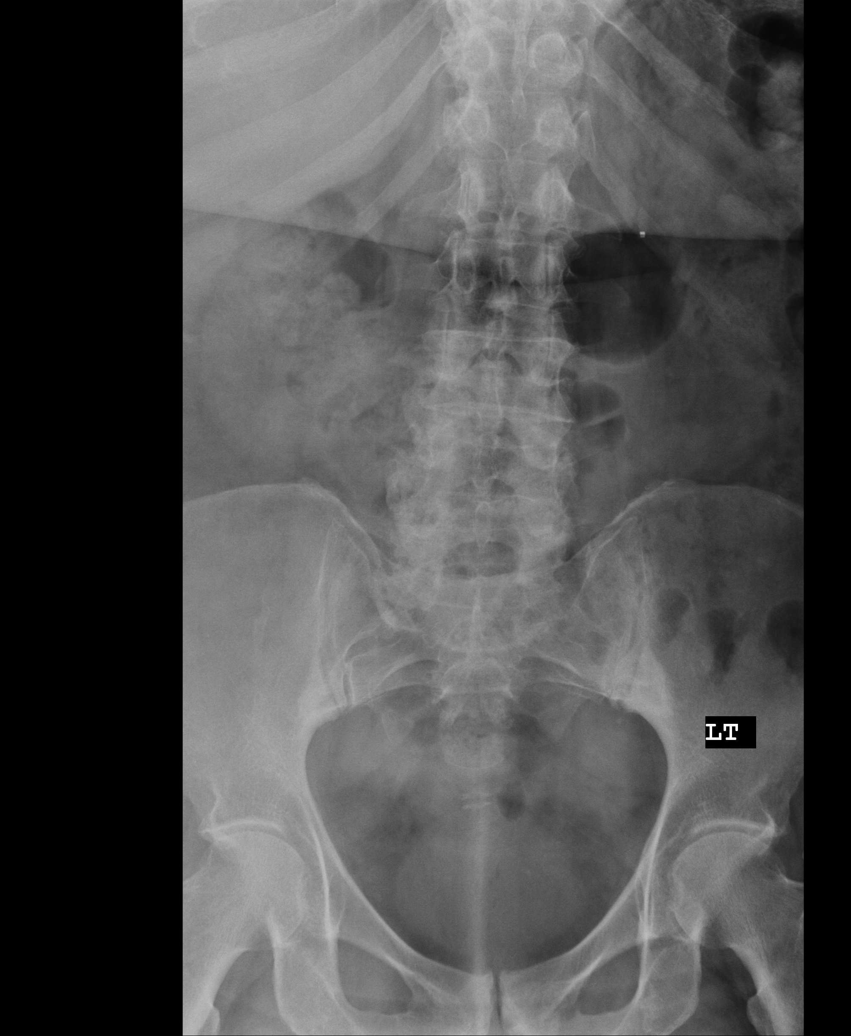
[im 2/5]
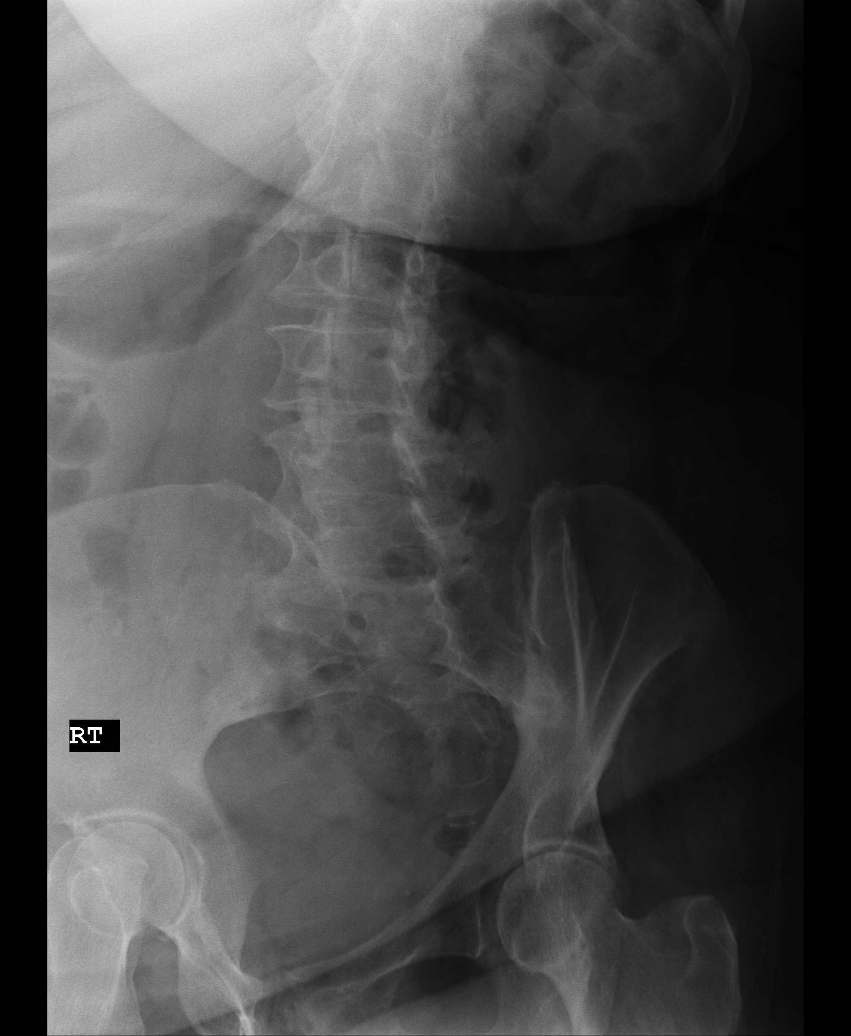
[im 3/5]
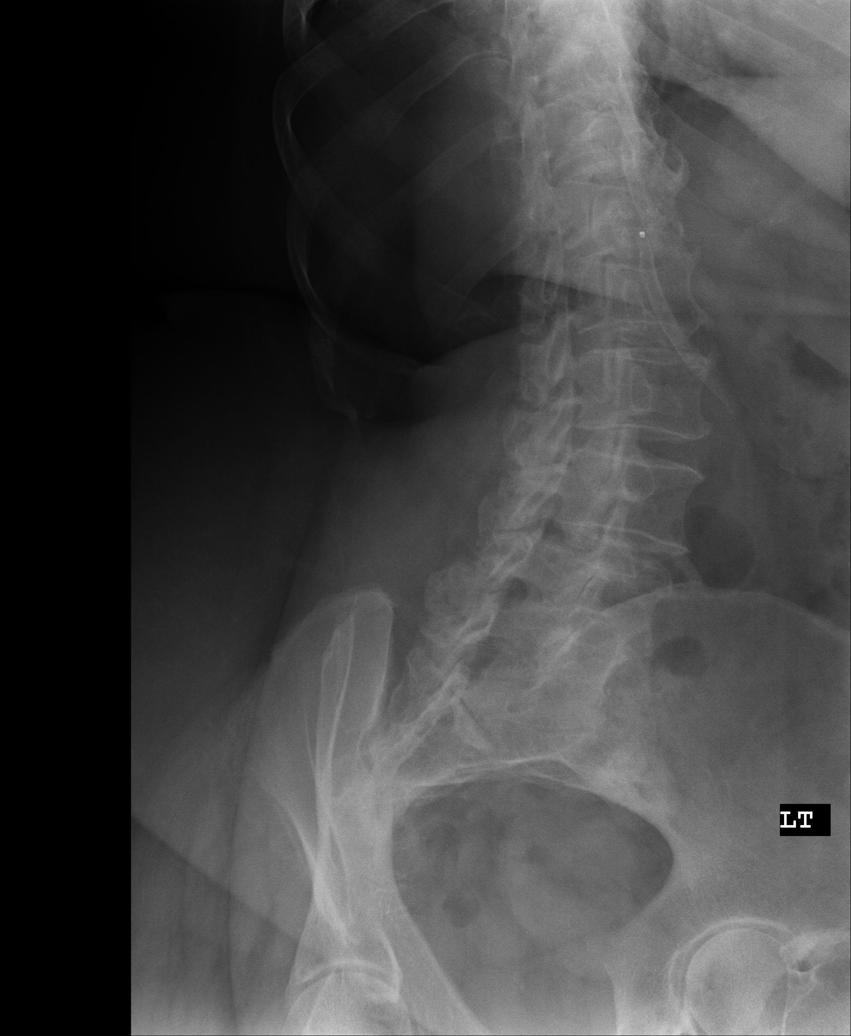
[im 4/5]
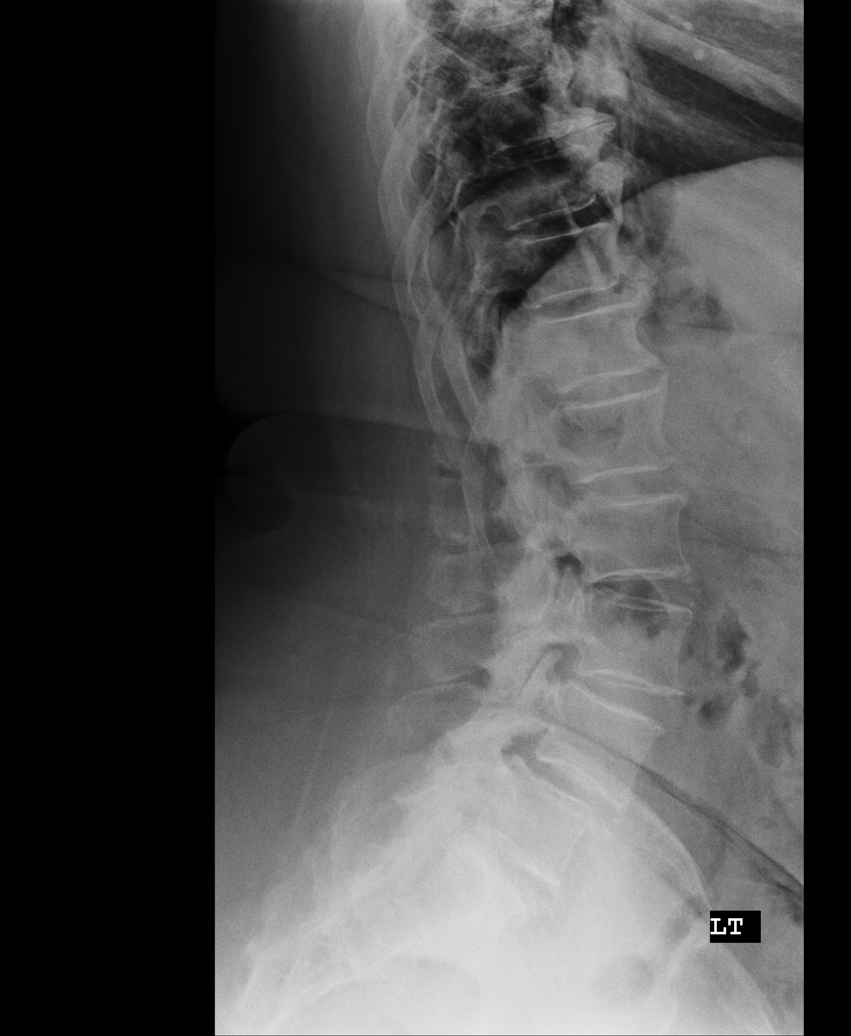
[im 5/5]
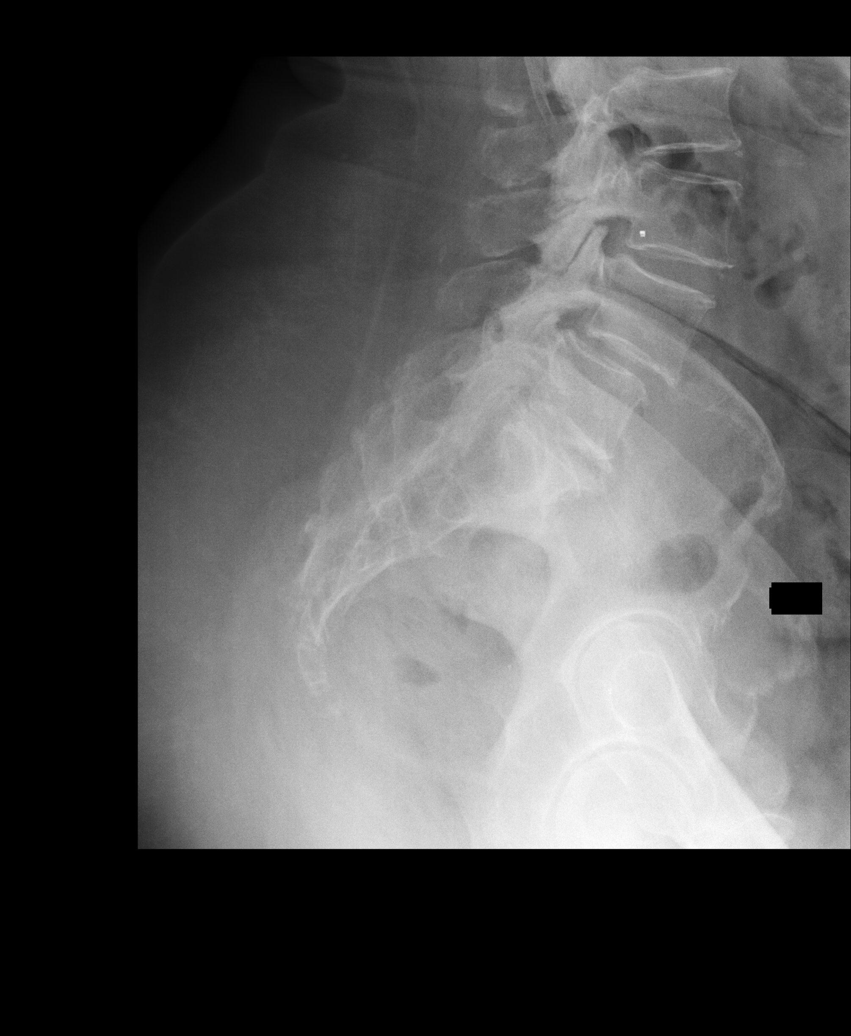

[5 of 5 positions shown; findings below may reference images not displayed]

FINDINGS: There is no acute fracture or subluxation. There is mild degenerative disc disease at L4-5 and L5-S1 levels. There is also grade 1 anterolisthesis of L4 on L5 vertebral body. There is extensive facet arthropathy throughout the lumbar spine. Paraspinal soft tissues are unremarkable.
IMPRESSION: Multilevel degenerative changes as detailed above.

## 2021-04-28 IMAGING — CR XRAY FOOT MINIMUM 3 VIEWS RT
1 series · 3 of 3 positions shown · non-contrast
Comparison: None available.

﻿EXAM:  [id number]      XRAY FOOT MINIMUM 3 VIEWS RT,XRAY FOOT MINIMUM 3 VIEWS LT
INDICATION: Pain both feet.

[Series 1: view not recorded · 0.17mm/px · 3 of 3 slices shown]
[im 1/3]
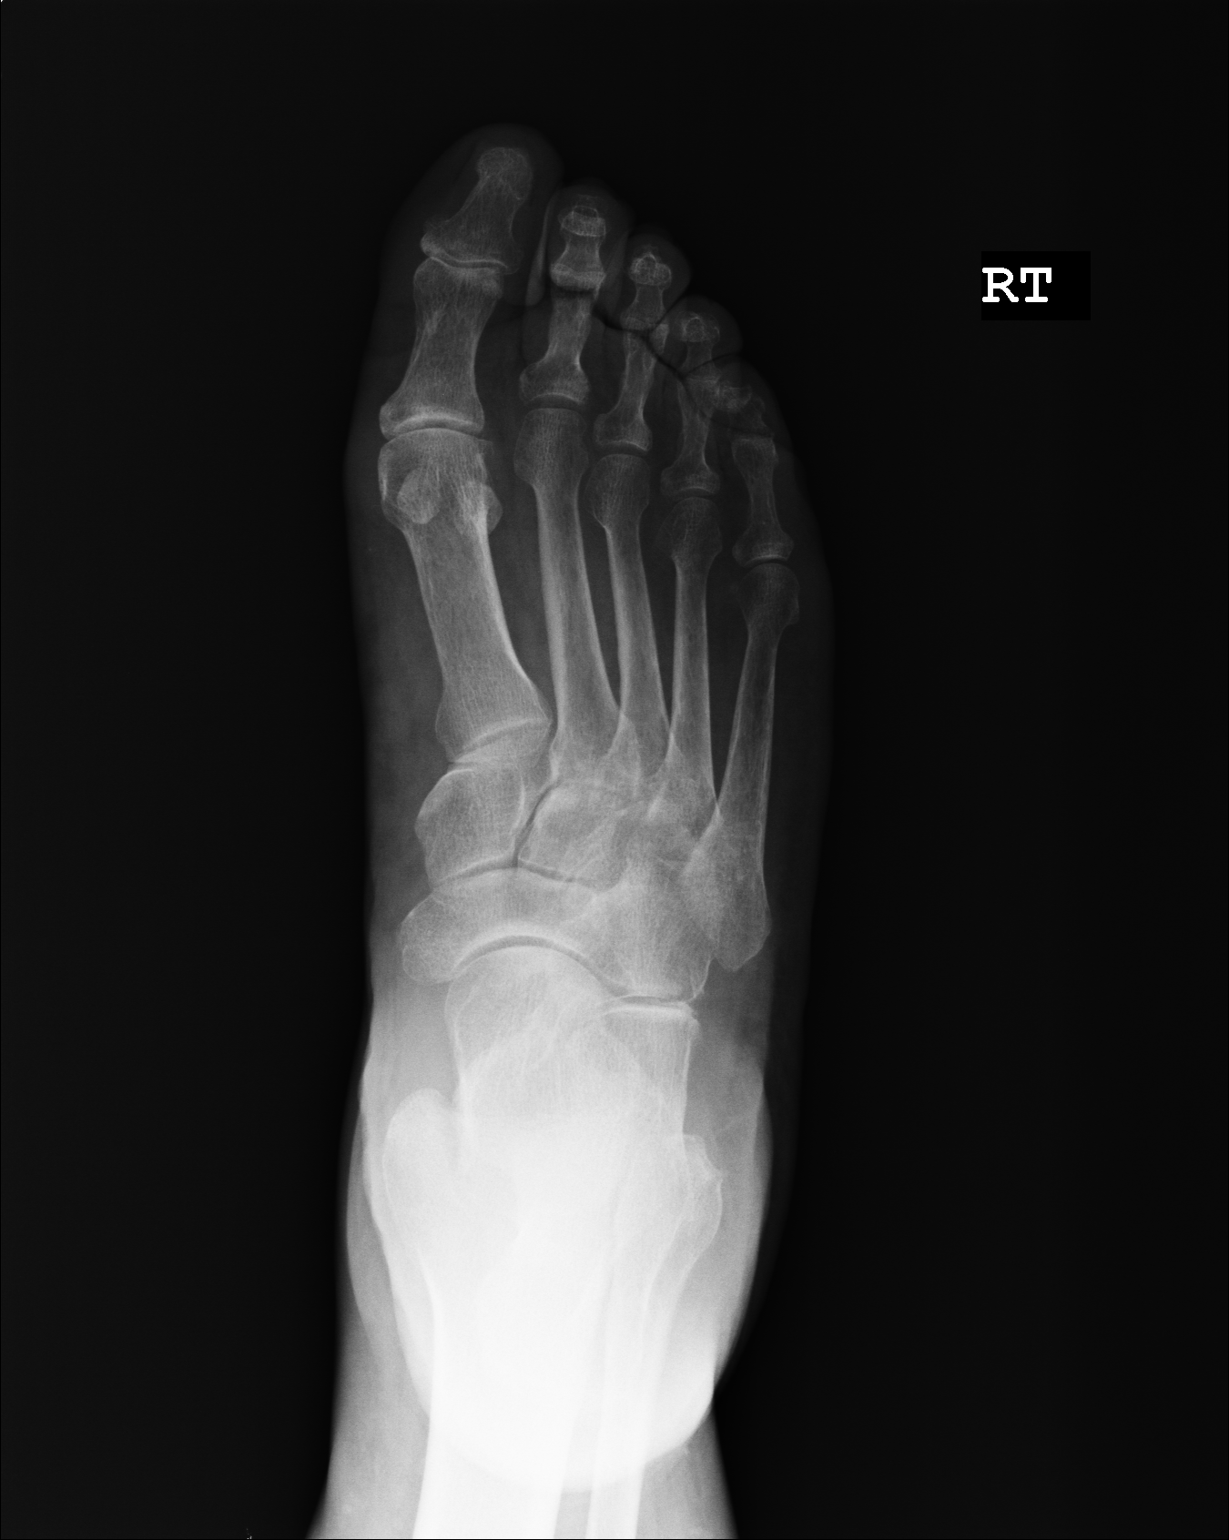
[im 2/3]
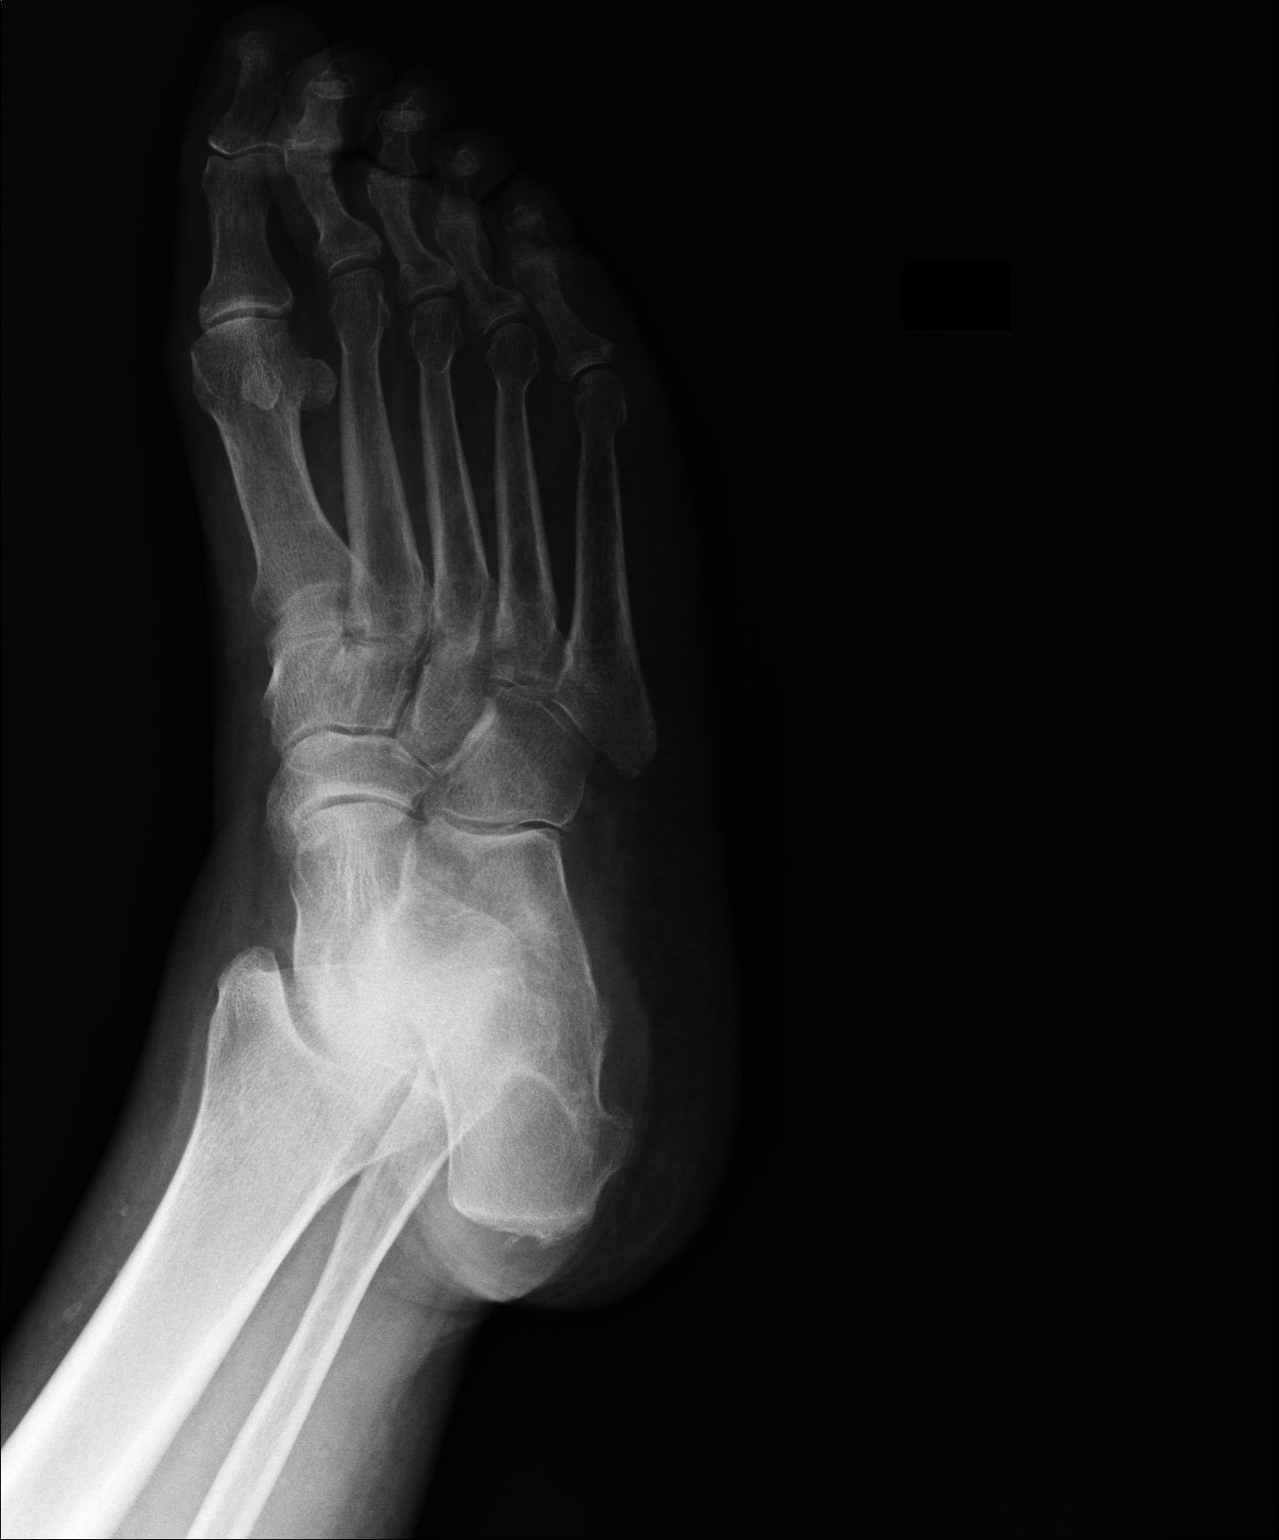
[im 3/3]
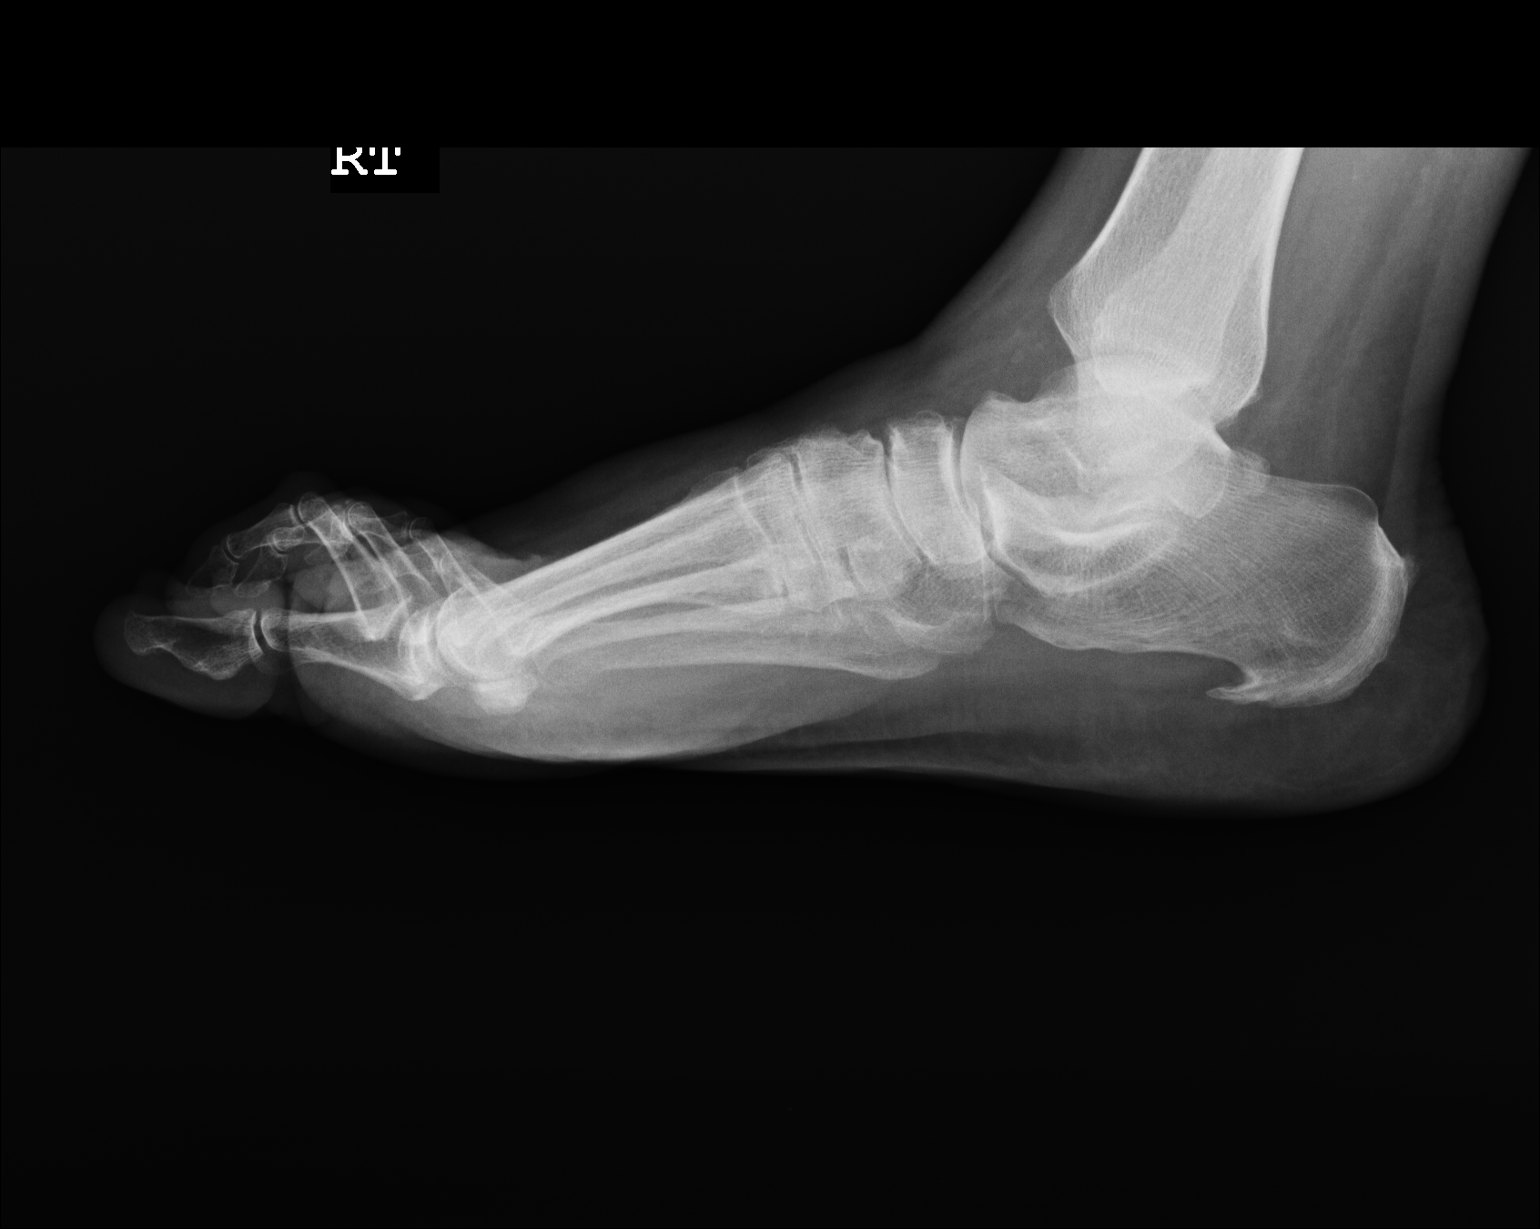

[3 of 3 positions shown; findings below may reference images not displayed]

FINDINGS: There is no acute fracture or subluxation. Mild osteoarthritis of the 1st metatarsophalangeal joint and midfoot is noted bilaterally. Bilateral heel spurs are also seen. There are no definite erosive changes. Surrounding soft tissues are unremarkable.
IMPRESSION: Osteoarthritis of both feet as detailed above.

## 2021-06-28 DIAGNOSIS — R000 Tachycardia, unspecified: Secondary | ICD-10-CM | POA: Insufficient documentation

## 2021-06-28 DIAGNOSIS — R079 Chest pain, unspecified: Secondary | ICD-10-CM | POA: Insufficient documentation

## 2021-08-18 DIAGNOSIS — F909 Attention-deficit hyperactivity disorder, unspecified type: Secondary | ICD-10-CM | POA: Insufficient documentation

## 2021-08-18 DIAGNOSIS — F419 Anxiety disorder, unspecified: Secondary | ICD-10-CM | POA: Insufficient documentation

## 2021-08-18 DIAGNOSIS — I4891 Unspecified atrial fibrillation: Secondary | ICD-10-CM | POA: Insufficient documentation

## 2021-10-13 DIAGNOSIS — E669 Obesity, unspecified: Secondary | ICD-10-CM | POA: Insufficient documentation

## 2021-11-27 ENCOUNTER — Other Ambulatory Visit (HOSPITAL_COMMUNITY): Payer: Self-pay | Admitting: FAMILY PRACTICE

## 2021-11-27 DIAGNOSIS — M1990 Unspecified osteoarthritis, unspecified site: Secondary | ICD-10-CM

## 2021-11-27 DIAGNOSIS — M25512 Pain in left shoulder: Secondary | ICD-10-CM

## 2021-11-27 DIAGNOSIS — M546 Pain in thoracic spine: Secondary | ICD-10-CM

## 2021-11-27 DIAGNOSIS — M25511 Pain in right shoulder: Secondary | ICD-10-CM

## 2021-12-08 ENCOUNTER — Other Ambulatory Visit: Payer: Self-pay

## 2021-12-08 ENCOUNTER — Other Ambulatory Visit (HOSPITAL_COMMUNITY): Payer: Self-pay | Admitting: FAMILY PRACTICE

## 2021-12-08 ENCOUNTER — Ambulatory Visit (HOSPITAL_COMMUNITY)
Admission: RE | Admit: 2021-12-08 | Discharge: 2021-12-08 | Disposition: A | Discharge status: Still In Hospital | Payer: Medicare Other | Source: Ambulatory Visit

## 2021-12-08 ENCOUNTER — Inpatient Hospital Stay
Admission: RE | Admit: 2021-12-08 | Discharge: 2021-12-08 | Disposition: A | Discharge status: Home / Self Care | Payer: Medicare Other | Source: Ambulatory Visit | Attending: FAMILY PRACTICE | Admitting: FAMILY PRACTICE

## 2021-12-08 DIAGNOSIS — M545 Low back pain, unspecified: Secondary | ICD-10-CM

## 2021-12-08 DIAGNOSIS — M1990 Unspecified osteoarthritis, unspecified site: Secondary | ICD-10-CM | POA: Insufficient documentation

## 2021-12-08 DIAGNOSIS — M25511 Pain in right shoulder: Secondary | ICD-10-CM

## 2021-12-08 DIAGNOSIS — M25512 Pain in left shoulder: Secondary | ICD-10-CM | POA: Insufficient documentation

## 2021-12-08 DIAGNOSIS — M546 Pain in thoracic spine: Secondary | ICD-10-CM | POA: Insufficient documentation

## 2021-12-08 NOTE — PT Evaluation (Signed)
[facility]  Outpatient Physical Therapy  [place], [address]  [phone]  [phone]       Physical Therapy Upper Extremity Evaluation    Date: 12/08/2021  Patient's Name: Dawn Riley  Date of Birth: [birth year]    Time In: 9:15 Time Out: 10:00           SUBJECTIVE  Chief Complaint/MOI: bilateral shoulder pain, t/s pain and OA  Pain since early Feb 2023, Had to restrain grandson during seizure and pain since  Not sleeping well, taking a lot of anti inflammatory's     Home Situation: lives with husband     Past Medical History/Precautions:  Bilateral TKA, abdominal hernia repair, vertigo    Previous episodes/treatments: 1 yr ago - PT     Medications for this problem: anti-inflammatory and has pain med prescription but has not gotten it yet    Diagnostic tests: l/s xray after PT eval     Patient goals: REDUCE PAIN    Occupation: caregiver and works at [employer]    Next MD visit: as needed     Pain location: pain starts upper arms and goes into the neck                     Pain description: SHARP, ACHING and throbbing, N&T neck and upper arms    Pain frequency:  CONTINUOUS    Pain rating: Now 7   Best 7   Worst 8.5    Pain increases with: ACTIVITY and sleeping or any use of hands/arms           decreases with : HEAT, MEDICATION and massager, electromagnetic pad     Weakness: yes in arms     Sleep affected: yes, wakes a lot - pain with sidelying     Headaches: yes - frontal lobe     Dizziness: lightheadedness    Handedness: right     Subjective Functional Reports:    Lifting: LIMITED and 15 lbs     Patient-Specific Functional Scale:  Problem Score   1. Sleeping  4   2. Washing/wiping self 4   3. Dressing - bra  5   Total 6.5      OBJECTIVE    Posture: DECREASED LORDOSIS, FORWARD HEAD, DOWAGER'S HUMP and right scapula protracted    Cervical screening: WFL no UE symptoms reported     Shoulder AROM   Right (degrees) Left (degrees)   Flexion 102 105     Extension 42 25   Abduction 100 55   Adduction WFL WFL    ER T1 T1   IR L5 sacrum     Elbow AROM    Right (degrees) Left (degrees)   Flexion WFL WFL   Extension [facility] WFL     Supination          Pronation           ROM comment pain upper arms with all active ROM of the shoulders   PROM right shoulder WFL              Left shoulder 80%     T/s rotation 80% bilateral - pain with left into upper arm     Strength (defined as __/5 based on 0/5 - 5/5 grading system)     Right Left   Shoulder flexion 3- 3-   Shoulder abduction  3- 3-   Shoulder IR 3- 3-  Shoulder ER 3- 3-   Elbow flexion  4 4   Elbow extension  4 4   Supination 4 4   Pronation 4 4   Wrist extension  4 4   Wrist flexion  4 4     Strength comment some scapular elevation with shoulder flex     Palpation: increased tone/pain bilateral pectoral   Patient keeps c/s muscles tense     Reflexes  Biceps 2+   Triceps 2+   Brachioradialis 2+     Shoulder special tests: positive Neer's left > right, positive Michel Bickers left > right     Treatment provided:REVIEW OF POC AND GOALS WITH PATIENT, ALL QUESTIONS ANSWERED, PATIENT EDUCATION and THERAPEUTIC EXERCISE           ASSESSMENT    Patient presents with bilateral shoulder and neck/upper back pain since Feb. Hx of cervical problems with previous treatment. She presents with limited shoulder AROM left > right, weakness Bilateral shoulders, tightness pectorals, decreased c/s and shoulder posturing, weakness scapular stabilizers. Patient will benefit from PT services for ROM, strength, MFR, posture ed and HEP.     Rehab potential: GOOD    STGs 3 Weeks:  1.  Patient will be independent (with PRN use of HEP handout) with progressive HEP to maximize gains from PT.  2.  Patient will report less than max pain of 5/10 to aid with participation in physical therapy.    LTGs 6 Weeks:  1.  Patient will improve functional ability with Patient Specific Functional Scale score of at least 8.5.  2.  Patient will demonstrate improved  both shoulder AROM to at least  75% to aid in ability to perform ADLs independently.  3.  Patient will demonstrate improved bilateral shoulder strength to at least 4 to 4-/5 throughout to aid in return to previous level of function.  4.  Patient will demonstrate improved t/s mobility for improved posture         PLAN  Patient will attend 2 times per week x 6 weeks. Therapy may include, but is not limited to THERAPEUTIC EXERCISES, MYOFASCIAL/JOINT MOBILIZATION, HOME INSTRUCTIONS, HEAT/COLD, ULTRASOUND, ELECTRICAL STIMULATION and KINESIOTAPE - HEP of scapular retraction, wand flex and t/s rotation/fixed head     Evaluation complexity:   Personal factors impacting POC: FREQUENT OR CHRONIC PAIN   Co-morbidities impacting POC: PREVIOUS SURGERIES  Complexity of physical exam: INCLUDING MUSCULOSKELETAL SYSTEM (POSTURE, ROM, STRENGTH, HEIGHT/WEIGHT)   Clinical Presentation: STABLE   Evaluation Complexity: LOW-HISTORY 0, EXAMINATION 1-2, STABLE PRESENTATION     Intervention minutes: EVALUATION 35 minutes and THERAPEUTIC EXERCISE 10 minutes    Zenia Resides, PT  12/08/2021, 09:18    Start of Service: _________          Certification:    From:______  Through:_________    I certify the need for these services furnished under this plan of treatment and while under my care.    Referring Provider Signature: _______________     Date : _____________________

## 2021-12-11 ENCOUNTER — Other Ambulatory Visit (HOSPITAL_COMMUNITY): Payer: Self-pay

## 2021-12-11 DIAGNOSIS — M545 Low back pain, unspecified: Secondary | ICD-10-CM

## 2021-12-12 ENCOUNTER — Ambulatory Visit (HOSPITAL_COMMUNITY)
Admission: RE | Admit: 2021-12-12 | Discharge: 2021-12-12 | Disposition: A | Discharge status: Still In Hospital | Payer: Medicare Other | Source: Ambulatory Visit

## 2021-12-12 ENCOUNTER — Other Ambulatory Visit: Payer: Self-pay

## 2021-12-12 NOTE — PT Treatment (Signed)
[facility]  Outpatient Physical Therapy  [address]  [place], [address]  [phone]  ([phone]    Physical Therapy Treatment Note    Date: 12/12/2021  Patient's Name: Dawn Riley  Date of Birth: 02-08-1956            Visit #/POC: 2/12; 5/19  Authorization: med Arther Dames      Evaluating Physical Therapist: Lin Givens, PT  PT diagnosis/Reason for Referral: bilat shoulder and thoracic pain  Next Scheduled Physician Appointment: 12/28/21          Subjective: Pain 7/10 bilat shoulders biceps superiorly into UT rounding into anterior shoulder along with pain around scapula and thoracic spine. Patient requested trigger point release as she notes this has helped considerably in past times of attending therapy.     Objective: Treatment performed as noted below.       EXERCISE/ACTIVITY NAME REPETITIONS RESISTANCE   Seated thoracic rotation with head neutral   x10    Supine wand: flexion  --horizontal abd  --ceiling punch   x10 ea    Seated thoracic extension with ball   x10    Sidelying: ER, abd and flexion   x10 ea    MFR   bilat UT, periscap and biceps                                    Assessment: She does not like laying supine or sidelying, but is able to tolerate for exercises. Patient has tightness throughout soft tissue as noted above, but nothing excessive or unexpected. No changes made to HEP. She was instructed to drink water this evening in response to MFR and can use MH at home to address muscle tightness.     Plan: Assess response to treatment and progress as able.     Total Session Time 35  THERAPEUTIC EXERCISE 35 minutes (JOINT MOBILIZATION/MFR 10 minutes with time included in exercise)      Tarri Abernethy, PTA  12/12/2021, 17:15

## 2021-12-15 ENCOUNTER — Ambulatory Visit (HOSPITAL_COMMUNITY)
Admission: RE | Admit: 2021-12-15 | Discharge: 2021-12-15 | Disposition: A | Discharge status: Still In Hospital | Payer: Medicare Other | Source: Ambulatory Visit

## 2021-12-15 ENCOUNTER — Other Ambulatory Visit: Payer: Self-pay

## 2021-12-19 ENCOUNTER — Ambulatory Visit (HOSPITAL_COMMUNITY)
Admission: RE | Admit: 2021-12-19 | Discharge: 2021-12-19 | Disposition: A | Discharge status: Still In Hospital | Payer: Medicare Other | Source: Ambulatory Visit

## 2021-12-19 ENCOUNTER — Other Ambulatory Visit: Payer: Self-pay

## 2021-12-19 NOTE — PT Treatment (Signed)
[facility]  Outpatient Physical Therapy  [place], [address]  [phone]  [phone]    Physical Therapy Treatment Note    Date: 12/19/2021  Patient's Name: Dawn Riley  Date of Birth: [birth year]    Visit #/POC: 4/12; 5/19  Authorization: med Delma Post      Evaluating Physical Therapist: Marlynn Perking, PT  PT diagnosis/Reason for Referral: bilat shoulder and thoracic pain  Next Scheduled Physician Appointment: 12/28/21          Subjective: Patient reports both visits post eval seemed to give immediate relief, but no carry over  In reduction in symptoms. Currently, she reports feeling a lot of tension, pressure and aching in bilat shoulders rounding into bilat UT.     Objective: Treatment performed as noted below.       EXERCISE/ACTIVITY NAME REPETITIONS RESISTANCE  Completed on         DOS   Seated thoracic rotation with head neutral   x10  no   Supine wand: flexion  --horizontal abd  --ceiling punch   x10 ea  yes   Seated thoracic extension with ball   x10  no   Sidelying: ER, abd and flexion   RUE: x8, x10, x10  LUE: x7, x10, x5   1# with RUE on all 3  1# only with ER and flexion, 0# with abd   Yes: added to HEP   MFR   bilat UT, periscap and biceps  No  no   PROM all directions: bilat shoulders    manual yes   supine: horizontal abd  ---shoulder extension(pull down)   x9    x7 (R), x Yellow Tband    Yellow Tband Yes: added to HEP  Yes: added to HEP   HEP education     yes                 Assessment:  PROM L>R with tightness. Patient feels MFR is very beneficial, however she was educated strengthening will give her prolonged benefits. Also discussed proper diet and which foods trigger inflammation in the body and which foods decrease inflammation. Illustrated handout given adding strengthening to HEP.     Plan: Assess response to treatment.    Total Session Time 38, Timed code minutes 38 and Untimed code minutes  0  THERAPEUTIC EXERCISE 38 minutes      Benjie Karvonen, PTA  12/19/2021, 08:55

## 2021-12-21 ENCOUNTER — Other Ambulatory Visit: Payer: Self-pay

## 2021-12-21 ENCOUNTER — Ambulatory Visit (HOSPITAL_COMMUNITY)
Admission: RE | Admit: 2021-12-21 | Discharge: 2021-12-21 | Disposition: A | Discharge status: Still In Hospital | Payer: Medicare Other | Source: Ambulatory Visit

## 2021-12-21 NOTE — PT Treatment (Signed)
[facility]  Outpatient Physical Therapy  [place], [address]  [phone]  [phone]    Physical Therapy Treatment Note    Date: 12/21/2021  Patient's Name: Dawn Riley  Date of Birth: November 26, 1955      Visit #/POC:5/12; 5/19  Authorization:med nec      Evaluating Physical Therapist:Debbie Lyn Joens, PT  PT diagnosis/Reason for Referral:bilat shoulder and thoracic pain  Next Scheduled Physician Appointment:12/28/21      Subjective: states she has a lot of trouble sleeping. Sleeps on Right side. Doing HEP 1x/daily in morning. Pain level 6.5.  Pain range 6.5 to 10. Worse pain at night. Pain starts lateral neck and down arms     Objective: education on pillows and posture in bed     AROM right shoulder flex 95 (was 10 at [facility]), ext 52 (was 42), abd 95 (was 100),  IR to waist (was L5),               Left shoulder flex 92 (was 105), ext 30 (was 25), abd 95 (was 55), IR to waist (was sacrum)   PROM WFL all planes Bilateral - some discomfort end range abd Right > left  Active flex bilateral after session was 120     EXERCISE/ACTIVITY NAME REPETITIONS RESISTANCE  Completed on         DOS   Seated thoracic rotation with head neutral   x10  Yes warm up   Supine wand: flexion  --horizontal abd  --ceiling punch   x10 ea  yes   Seated thoracic extension with ball   x10  no   Sidelying: ER, abd and flexion   RUE: x10, x10, x10  LUE: x10 , x10, x10   1# with RUE on all 3  1# LUE with on al 3  Yes: added to HEP   MFR   bilat UT, periscap and biceps  No  no   PROM all directions: bilat shoulders    manual yes   supine: horizontal abd  ---shoulder extension(pull down)  - PNF diagonals    x10    x7 (R), x 7 (L)  x10   Yellow Tband    Yellow Tband  Yellow Tband  Yes: added to HEP    Yes: added to HEP  Yes: added to HEP    HEP education  Posture education  Pillow education      Yes  Yes  Yes                 Assessment: AROM has improved all  planes except flex pre session but improved post session. PROM is [facility] - limited by weakness. Education on sleeping posture/pillow use. Education on posture   STGs 3 Weeks:  1.  Patient will be independent (with PRN use of HEP handout) with progressive HEP to maximize gains from PT.  Progressing  2.  Patient will report less than max pain of 5/10 to aid with participation in physical therapy.    LTGs 6 Weeks:  1.  Patient will improve functional ability with Patient Specific Functional Scale score of at least 8.5.  2.  Patient will demonstrate improved both shoulder AROM to at least  75% to aid in ability to perform ADLs independently.  3.  Patient will demonstrate improved bilateral shoulder strength to at least 4 to 4-/5 throughout to aid in return to previous level of function.  4.  Patient will demonstrate improved t/s  mobility for improved posture   Plan: continue focus on strength    Total Session Time 38 and Timed code minutes 38  THERAPEUTIC EXERCISE 38 minutes      Zenia Resides, PT  12/21/2021, 08:32

## 2021-12-26 NOTE — PT Treatment (Signed)
[facility]  Outpatient Physical Therapy  [place], [address]  [phone]  [phone]    Physical Therapy Treatment Note          Date: 12/15/2021  Patient's Name: Dawn Riley  Date of Birth: [birth year]    Visit #/POC:3/12; 5/19  Authorization:med nec      Evaluating Physical Therapist:Debbie Lamothe, PT  PT diagnosis/Reason for Referral:bilat shoulder and thoracic pain  Next Scheduled Physician Appointment:12/28/21          Subjective: Patient states she is sore in her "muscles" and feels "knotted up."    Objective: Supine wand exercises followed by prolonged stretching and MFR to L shoulder.      EXERCISE/ACTIVITY NAME REPETITIONS RESISTANCE COMPLETED THIS DOS   Supine wand flexion   10 0 4/7   Supine wand horiz. abd 10 0 4/7   Ceiling punch 10 0 4/7   Prolonged abd stretch manual  4/7   MFR L shoulder manual  4/7                                                         Assessment: Tol session well.     Plan: Cont POC.    STGs3Weeks:  1. Patient will be independent (with PRN use of HEP handout) with progressive HEP to maximize gains from PT.  Progressing  2. Patient will report less than max pain of 5/10 to aid with participation in physical therapy.    LTGs6Weeks:  1. Patient will improve functional ability with Patient Specific Functional Scale score of at least 8.5.  2. Patient will demonstrate improved bothshoulder AROM to at least 75%to aid in ability to perform ADLs independently.  3. Patient will demonstrate improved bilateralshoulder strength to at least 4 to 4-/5 throughout to aid in return to previous level of function.  4.Patient will demonstrate improved t/s mobility for improved posture    Total Session Time 45, Timed code minutes 45 and Untimed code minutes 45  THERAPEUTIC EXERCISE 45 minutes      Elinor Parkinson, PTA  12/26/2021, 07:55

## 2021-12-27 ENCOUNTER — Ambulatory Visit (HOSPITAL_COMMUNITY)
Admission: RE | Admit: 2021-12-27 | Discharge: 2021-12-27 | Disposition: A | Discharge status: Still In Hospital | Payer: Medicare Other | Source: Ambulatory Visit

## 2021-12-27 ENCOUNTER — Other Ambulatory Visit: Payer: Self-pay

## 2021-12-27 NOTE — PT Treatment (Signed)
[facility]  Outpatient Physical Therapy  [place], [address]  [phone]  [phone]    Physical Therapy Treatment Note    Date: 12/27/2021  Patient's Name: Dawn Riley  Date of Birth: [birth year]      Visit #/POC:6/12; 5/19  Authorization:med nec      Evaluating Physical Therapist:Debbie Lamothe, PT  PT diagnosis/Reason for Referral:bilat shoulder and thoracic pain  Next Scheduled Physician Appointment:12/28/21      Subjective: Continues to report pain with sleeping with decreased frequency, no change with intensity. Apply pressure to soft tissue reduces the pain.       Objective: Treatment delivered as noted below with progression    EXERCISE/ACTIVITY NAME REPETITIONS RESISTANCE  Completed on         DOS   Seated thoracic rotation with head neutral   x10  no   Supine wand: flexion  -    -horizontal abd  --ceiling punch   x10 ea    Yes: then progressed to supine Active 12/27/21    Yes  yes   Seated thoracic extension with ball   x10  no   Sidelying: ER, abd and flexion   RUE: x10, x10, x10    LUE: x10 , x10, x10   1# with RUE on all 3    1# LUE with on al 3   no: held 12/27/21 d/t pain  No: held 12/27/21 d/t pain   MFR   bilat UT, periscap and biceps  No  no   PROM all directions: bilat shoulders    manual no   supine: horizontal abd  ---shoulder extension(pull down)       PNF diagonals    x10    x7 (R), x 7 (L)      x10   Yellow Tband    Yellow Tband    Y  ellow Tband  Yes then progressed to standing 12/27/21  yes: progressed to standing low rows 12/27/21  Yes: progressed to standing no resist 12/27/21   HEP education  Posture education  Pillow education      yes  no  no   UBE    x5 min  60 rpm yes   Wall wipes x3 directions (up, lateral and across body)  unilateral x5 ea direction  with towel yes   Ball rolls vs door CW and CCW  x10 ea bilat  green mediball  yes   At door: resisted slide to 1:00, 3:00, 5:00  x3 ea bilat   yellow Tband  yes    reverse wall push up with bilat elbow flexion  x5 with 5 sec hold  at door   yes   Standing rows   --lat pulls x10  x10 Red Tband  Red Tband Yes  yes   Standing horizontal abd 2x5 Yellow tband yes   Standing D2 flexion 2x5 No resist yes   Supine  flexion 2x5  yes   MH bilat shoulder x10 min  yes         Assessment: Since she has difficulty tolerating sidelying position d/t bilat shoulder pain exercises were performed in standing. Attempted shoulder flexion in standing, but unable d/t pain and weakness, this exercise was then performed in supine as this provides scapular stabilization and decreases the effects of gravity. She was instructed to perform supine flexion without wand at home as current HEP is performing AA with SC. Fair tolerance to treatment. MH to bilat shoulders  at end of session to address pain.     STGs 3 Weeks:  1.  Patient will be independent (with PRN use of HEP handout) with progressive HEP to maximize gains from PT.  Progressing  2.  Patient will report less than max pain of 5/10 to aid with participation in physical therapy.    LTGs 6 Weeks:  1.  Patient will improve functional ability with Patient Specific Functional Scale score of at least 8.5.  2.  Patient will demonstrate improved both shoulder AROM to at least  75% to aid in ability to perform ADLs independently.  3.  Patient will demonstrate improved bilateral shoulder strength to at least 4 to 4-/5 throughout to aid in return to previous level of function.  4.  Patient will demonstrate improved t/s mobility for improved posture     Plan: Assess response to progressive strengthening    Total Session Time 53 and Timed code  43 minutes, untimed min 10 min  THERAPEUTIC EXERCISE 43 minutes    Benjie Karvonen, PTA  12/27/2021, 16:36

## 2021-12-29 ENCOUNTER — Other Ambulatory Visit: Payer: Self-pay

## 2021-12-29 ENCOUNTER — Ambulatory Visit (HOSPITAL_COMMUNITY)
Admission: RE | Admit: 2021-12-29 | Discharge: 2021-12-29 | Disposition: A | Discharge status: Still In Hospital | Payer: Medicare Other | Source: Ambulatory Visit

## 2021-12-29 NOTE — PT Treatment (Signed)
[facility]  Outpatient Physical Therapy  [address]  [place], [address]  [phone]  (Fax) [phone]    Physical Therapy Treatment Note    Date: 12/29/2021  Patient's Name: Dawn Riley  Date of Birth: 06/02/1956      Visit #/POC:7/12; 5/19  Authorization:med nec      Evaluating Physical Therapist:Debbie Lamothe, PT  PT diagnosis/Reason for Referral:bilat shoulder and thoracic pain  Next Scheduled Physician Appointment:was 12/28/21 but rescheduled.      Subjective: Rescheduled appt with referring PCP as she is supposed to get an appt neurologist prior and has not done so yet. States good response to last visit with mild soreness. States sleep is some better, performs indep trigger point release and pain improves.     Objective: Treatment delivered as noted below     EXERCISE/ACTIVITY NAME REPETITIONS RESISTANCE  Completed on         DOS   Seated thoracic rotation with head neutral   x10  no   Supine wand: flexion  -    -horizontal abd  --ceiling punch   x10 ea    no: added to HEP 12/27/21 without wand    no  no   Seated thoracic extension with ball   x10  no   Sidelying: ER, abd and flexion   RUE: x10, x10, x10    LUE: x10 , x10, x10   1# with RUE on all 3    1# LUE with on al 3   no: held 12/27/21 d/t pain and  12/29/21  No: held 12/27/21 and  12/29/21 d/t pain   MFR   bilat UT, periscap and biceps  No  no   PROM all directions: bilat shoulders    manual no   supine: horizontal abd  ---shoulder extension(pull down)       PNF diagonals    x10    x7 (R), x 7 (L)      x10   Yellow Tband    Yellow Tband    Y  ellow Tband  No: D/C  to standing 12/29/21  no: D/C to standing low rows 12/29/21  No: D/C to standing no resist 12/29/21   HEP education  Posture education  Pillow education      yes  no  no   UBE    x5 min  60 rpm yes   Wall wipes x3 directions (up, lateral and across body)  unilateral x5 ea direction  with towel yes   Ball rolls vs door  CW and CCW  x10 ea bilat  green mediball  yes   At door: resisted slide to 1:00, 3:00, 5:00  x5 ea bilat  yellow Tband  yes    reverse wall push up with bilat elbow flexion  x5 with 5 sec hold  at door   yes   Standing rows   --lat pulls x15  x10 Red Tband  Red Tband Yes  yes   Standing horizontal abd 2x5 Yellow tband no   Standing D2 flexion 2x5 No resist no   Supine  flexion x10 ea  yes   MH bilat shoulder x10 min  yes   Standing at mat table with PB: resisted:   --Flexion and extension  --1/2 moon: med/lateral directions      x10ea  x5 ea direction Red Tband      Yellow Tband Yes      yes   SA foam rolls at door  x10  yes                     Assessment: Initiated functional UE strengthening with resisted exercises performed using PB at mat table. Post treatment she states her mid back is hurting and wonders if she performed exercises incorrectly, but she was educated several of the exercises performed this day targets mid back/scapular muscles.     STGs 3 Weeks:  1.  Patient will be independent (with PRN use of HEP handout) with progressive HEP to maximize gains from PT.  Progressing  2.  Patient will report less than max pain of 5/10 to aid with participation in physical therapy.    LTGs 6 Weeks:  1.  Patient will improve functional ability with Patient Specific Functional Scale score of at least 8.5.  2.  Patient will demonstrate improved both shoulder AROM to at least  75% to aid in ability to perform ADLs independently.  3.  Patient will demonstrate improved bilateral shoulder strength to at least 4 to 4-/5 throughout to aid in return to previous level of function.  4.  Patient will demonstrate improved t/s mobility for improved posture     Plan: Will progress HEP next visit.     Total Session Time 53  and Timed code 43 minutes, untimed min 10 min  THERAPEUTIC EXERCISE 43 minutes    Tarri Abernethy, PTA  12/29/2021, 16:19

## 2022-01-03 ENCOUNTER — Ambulatory Visit (HOSPITAL_COMMUNITY)
Admission: RE | Admit: 2022-01-03 | Discharge: 2022-01-03 | Disposition: A | Discharge status: Still In Hospital | Payer: Medicare Other | Source: Ambulatory Visit

## 2022-01-03 ENCOUNTER — Other Ambulatory Visit: Payer: Self-pay

## 2022-01-03 NOTE — PT Treatment (Addendum)
[facility]  Outpatient Physical Therapy  [place], [address]  [phone]  [phone]    Physical Therapy Treatment Note    Date: 01/03/2022  Patient's Name: Dawn Riley  Date of Birth: August 25, 1956      Visit #/POC:8/12; 5/19  Authorization:med nec      Evaluating Physical Therapist:Debbie Harwood Nall, PT  PT diagnosis/Reason for Referral:bilat shoulder and thoracic pain  Next Scheduled Physician Appointment:01/17/22      Subjective: Patient feels she is 80% improved since [facility] She states her ability to sleep and pain during the day both have improved. Functional gains: able to don bra with less pain    Objective: Treatment delivered as noted below     Standing shoulder AROM: R(L): flexion 105(110) degrees, abd 100(112), IR bilat T9 (has to walk L up)  MET: BUE 4-4-/5 flexion and abd     Patient-Specific Functional Scale:  Problem Score                      01/03/22   1. Sleeping  4                                  7   2. Washing/wiping self 4                                  7   3. Dressing - bra  5                                  7   Total 4.33                            7.0                 EXERCISE/ACTIVITY NAME REPETITIONS RESISTANCE  Completed on         DOS   Seated thoracic rotation with head neutral   x10  no   Supine wand: flexion  -    -horizontal abd  --ceiling punch   x10 ea    no: added to HEP 12/27/21 without wand    no  no   Seated thoracic extension with ball   x10  no   Sidelying: ER, abd and flexion   RUE: x10, x10, x10    LUE: x10 , x10, x10   1# with RUE on all 3    1# LUE with on al 3   no: held 12/27/21 d/t pain and  12/29/21  No: held 12/27/21 and  12/29/21 d/t pain   MFR   bilat UT, periscap and biceps  No  no   PROM all directions: bilat shoulders    manual no   supine: horizontal abd  ---shoulder extension(pull down)       PNF diagonals    x10    x7 (R), x 7 (L)      x10   Yellow Tband    Yellow  Tband    Y  ellow Tband  No: D/C  to standing 12/29/21  no: D/C to standing low rows 12/29/21  No: D/C to standing no resist 12/29/21   HEP education  Posture education  Pillow  education      Yes: progressed 01/03/22  no  no   UBE    x5 min  60 rpm yes   Wall wipes x3 directions (up, lateral and across body)  unilateral x5 ea direction  with towel no   Ball rolls vs door CW and CCW  x10 ea bilat  green mediball  no   At door: resisted slide to 1:00, 3:00, 5:00  x5 ea bilat  yellow Tband  no    reverse wall push up with bilat elbow flexion  x5 with 5 sec hold  at door   no   Standing rows   --lat pulls x20  x15 Red Tband  Red Tband Yes  yes   Standing horizontal abd 2x6 Yellow tband yes   Standing D2 flexion 1x10 No resist yes   Supine  flexion x10 ea  yes   MH bilat shoulder x10 min  no   Standing at mat table with PB: resisted:   --Flexion and extension  --1/2 moon: med/lateral directions      2x10 ea  x5 ea direction     Red Tband  Yellow Tband     Yes  no   SA foam rolls at door x10  no   Re-assess goals and objectives   yes               Assessment: After starting session and assessing bilat shoulder AROM she reports pain 3-4/10 bilat. Pain at worst during session 7-8/10, but at the end of session pain 5-6/10. She has made gains with BUE AROM and strength as noted above. See below for progression towards goals. Patient is very pain focused when performing exercises.     STGs 3 Weeks:  1.  Patient will be independent (with PRN use of HEP handout) with progressive HEP to maximize gains from PT.  MET 01/03/22  2.  Patient will report less than max pain of 5/10 to aid with participation in physical therapy. Unmet     LTGs 6 Weeks:  1.  Patient will improve functional ability with Patient Specific Functional Scale score of at least 8.5. Progressing  2.  Patient will demonstrate improved both shoulder AROM to at least  75% to aid in ability to perform ADLs independently. Progressing  3.  Patient will demonstrate  improved bilateral shoulder strength to at least 4 to 4-/5 throughout to aid in return to previous level of function. MET 01/03/22  4.  Patient will demonstrate improved t/s mobility for improved posture Progressing     Plan: Will continue working with strength gains for BUE and posture. Patient will be OOT next week (has visit on 01/05/22) and will resume therapy the week following.   Continue PT 2x/wk x 4 wks     Total Session Time 48  and Timed code 48 minutes, untimed min 0  THERAPEUTIC EXERCISE 48 minutes      Benjie Karvonen, PTA  01/03/2022, 10:45    Start of Service:     Re-certification:           From:    Through:       I have reviewed this plan of treatment and re-certify a continuing need for service.    Referring Provider Signature:       Date:        Treatment Diagnosis:

## 2022-01-05 ENCOUNTER — Ambulatory Visit
Admission: RE | Admit: 2022-01-05 | Discharge: 2022-01-05 | Disposition: A | Discharge status: Still In Hospital | Payer: Medicare Other | Source: Ambulatory Visit | Attending: FAMILY PRACTICE | Admitting: FAMILY PRACTICE

## 2022-01-05 ENCOUNTER — Other Ambulatory Visit: Payer: Self-pay

## 2022-01-17 ENCOUNTER — Ambulatory Visit (HOSPITAL_COMMUNITY)
Admission: RE | Admit: 2022-01-17 | Discharge: 2022-01-17 | Disposition: A | Discharge status: Still In Hospital | Payer: Medicare Other | Source: Ambulatory Visit

## 2022-01-17 ENCOUNTER — Other Ambulatory Visit: Payer: Self-pay

## 2022-01-17 DIAGNOSIS — M25512 Pain in left shoulder: Secondary | ICD-10-CM | POA: Insufficient documentation

## 2022-01-17 DIAGNOSIS — M546 Pain in thoracic spine: Secondary | ICD-10-CM | POA: Insufficient documentation

## 2022-01-17 DIAGNOSIS — M25511 Pain in right shoulder: Secondary | ICD-10-CM | POA: Insufficient documentation

## 2022-01-17 DIAGNOSIS — M1990 Unspecified osteoarthritis, unspecified site: Secondary | ICD-10-CM | POA: Insufficient documentation

## 2022-01-17 NOTE — PT Treatment (Signed)
[facility]  Outpatient Physical Therapy  [place], [address]  [phone]  [phone]    Physical Therapy Treatment Note    Date: 01/17/2022  Patient's Name: Dawn Riley  Date of Birth: 1956-04-02      Visit #/POC:10; 01/26/22  Authorization:med nec      Evaluating Physical Therapist:Debbie Sunil Hue, PT  PT diagnosis/Reason for Referral:bilat shoulder and thoracic pain  Next Scheduled Physician Appointment:01/17/22          Subjective: pain level 5 today. States she saw Dr Rubin Payor who told her to continue PT    Objective: warm up on UBE followed by therex per flow sheet   EXERCISE/ACTIVITY NAME REPETITIONS RESISTANCE Completed on   DOS   Seated thoracic rotation with head neutral   x10  no   Supine wand: flexion  -    -horizontal abd  --ceiling punch   x10 ea    no: added to HEP 12/27/21 without wand    no  no   Seated thoracic extension with ball   x10  no   Sidelying: ER, abd and flexion   RUE: x10, x10, x10    LUE: x10 , x10, x10   1# with RUE on all 3    1# LUE with on al 3   no: held 12/27/21 d/t pain and  12/29/21  No: held 12/27/21 and  12/29/21 d/t pain   MFR   bilat UT, periscap and biceps  No  no   PROM all directions: bilat shoulders    manual no   supine: horizontal abd  ---shoulder extension(pull down)       PNF diagonals    x10    x7 (R), x 7 (L)      x10   Yellow Tband    Yellow Tband    Y  ellow Tband  No: D/C  to standing 12/29/21  no: D/C to standing low rows 12/29/21  No: D/C to standing no resist 12/29/21   HEPeducation  Posture education  Pillow education      Yes: progressed 01/03/22  no  no   UBE    x5 min  60 rpm yes   Wall wipes x3 directions (up, lateral and across body)  unilateral x5 ea direction  with towel no   Ball rolls vs door CW and CCW  x10 ea bilat  green mediball  no   At door: resisted slide to 1:00, 3:00, 5:00  x5 ea bilat  yellow Tband  yes     reverse wall push up with  bilat elbow flexion  x5 with 5 sec hold  at door   no   Standing rows - high and low   --lat pulls x20  x20   x20  Red Tband  Red Tband  Red tband  Yes  yes   Standing horizontal abd 12  Yellow tband yes   Standing D2 flexion 15x Red band  yes   Supine  flexion x10 ea  no   MH bilat shoulder x10 min  no   Standing at mat table with PB: resisted:   --Flexion and extension  --1/2 moon: med/lateral directions      2x10 ea  x10 ea direction     Red Tband  red Tband     Yes  yes   SA foam rolls at door x10  no   Re-assess goals and objectives   yes  Assessment: progressing with strengthening. Posture is better today during session. Some fatigue  STGs3Weeks:  1. Patient will be independent (with PRN use of HEP handout) with progressive HEP to maximize gains from PT.  MET 01/03/22  2. Patient will report less than max pain of 5/10 to aid with participation in physical therapy.  Pain 5  On 01/17/22    LTGs6Weeks:  1. Patient will improve functional ability with Patient Specific Functional Scale score of at least 8.5. Progressing  2. Patient will demonstrate improved bothshoulder AROM to at least 75%to aid in ability to perform ADLs independently. Progressing  3. Patient will demonstrate improved bilateralshoulder strength to at least 4 to 4-/5 throughout to aid in return to previous level of function. MET 01/03/22  4.Patient will demonstrate improved t/s mobility for improved postureProgressing    Plan: continue PT for strength pending signed POCs     Total Session Time 35 and Timed code minutes 30  THERAPEUTIC EXERCISE 30 minutes      Zenia Resides, PT  01/17/2022, 13:03

## 2022-01-19 ENCOUNTER — Ambulatory Visit (HOSPITAL_COMMUNITY)
Admission: RE | Admit: 2022-01-19 | Discharge: 2022-01-19 | Disposition: A | Discharge status: Still In Hospital | Payer: Medicare Other | Source: Ambulatory Visit

## 2022-01-19 ENCOUNTER — Other Ambulatory Visit: Payer: Self-pay

## 2022-01-19 NOTE — PT Treatment (Signed)
[facility]  Outpatient Physical Therapy  [place], [address]  [phone]  [phone]    Physical Therapy Treatment Note    Date: 01/19/2022  Patient's Name: Dawn Riley  Date of Birth: December 29, 1955      Visit #/POC:11; 01/26/22  Authorization:med nec      Evaluating Physical Therapist:Debbie Lamothe, PT  PT diagnosis/Reason for Referral:bilat shoulder and thoracic pain  Next Scheduled Physician Appointment:none in respect          Subjective: Patient reports L shoulder continues to bother her, but not as bad as it was and knows she is improving.     Objective: warm up on UBE followed by therex per flow sheet   EXERCISE/ACTIVITY NAME REPETITIONS RESISTANCE Completed on   DOS   Seated thoracic rotation with head neutral   x10  no   Supine wand: flexion  -    -horizontal abd  --ceiling punch   x10 ea    no: added to HEP 12/27/21 without wand    no  no   Seated thoracic extension with ball   x10  no   Sidelying: ER, abd and flexion   RUE: x10, x10, x10    LUE: x10 , x10, x10   1# with RUE on all 3    1# LUE with on al 3   no: held 12/27/21 d/t pain and  12/29/21  No: held 12/27/21 and  12/29/21 d/t pain   MFR   bilat UT, periscap and biceps  No  no   PROM all directions: bilat shoulders    manual no   supine: horizontal abd  ---shoulder extension(pull down)       PNF diagonals    x10    x7 (R), x 7 (L)      x10   Yellow Tband    Yellow Tband    Y  ellow Tband  No: D/C  to standing 12/29/21  no: D/C to standing low rows 12/29/21  No: D/C to standing no resist 12/29/21   HEPeducation  Posture education  Pillow education      Yes: progressed 01/03/22  no  no   UBE    x5 min  60 rpm yes   Wall wipes x3 directions (up, lateral and across body)  unilateral x5 ea direction  with towel no   Ball rolls vs door CW and CCW  x10 ea bilat  green mediball  no   At door: resisted slide to 1:00, 3:00, 5:00  --Rainbows  x5   bilat  x5 bilat  yellow Tband  Yellow Tband  yes   yes    reverse wall push up with bilat elbow flexion  x5 with 5 sec hold  at door   yes   Standing rows - high and low   --lat pulls x13  x13  x10 Green Tband  Green Tband  Green  tband  Yes  Yes  yes   Standing horizontal abd x10 Yellow tband yes   Standing D2 flexion x10 Red band  yes   Supine  flexion x10 ea  no   MH bilat shoulder x10 min  no   Standing at mat table with PB: resisted:   --Flexion and extension  --1/2 moon: med/lateral directions      2x10 ea  x10 ea direction     Red Tband  red Tband     no  no   SA foam  rolls at door x10  no   Re-assess goals and objectives   no   machine rows x15 20# yes   Standing machine lat pull downs  --seated machine pull downs x13    x10 10#    10# Yes    yes                   Assessment:  Tried to increase Tband strength with resisted slide at door as she has been using the yellow tband since 12/27/21, however patient stated she "wants to stick with the yellow." Progressed strengthening this day by initiating machine weights. Performed with low weight, but good tolerance. Patient educated to expect soreness in response to progression, but if soreness is debilitating then exercises will be scaled back.     STGs3Weeks:  1. Patient will be independent (with PRN use of HEP handout) with progressive HEP to maximize gains from PT.  MET 01/03/22  2. Patient will report less than max pain of 5/10 to aid with participation in physical therapy.  Pain 5  On 01/17/22    LTGs6Weeks:  1. Patient will improve functional ability with Patient Specific Functional Scale score of at least 8.5. Progressing  2. Patient will demonstrate improved bothshoulder AROM to at least 75%to aid in ability to perform ADLs independently. Progressing  3. Patient will demonstrate improved bilateralshoulder strength to at least 4 to 4-/5 throughout to aid in return to previous level of function. MET 01/03/22  4.Patient will  demonstrate improved t/s mobility for improved postureProgressing    Plan: Assess response to progression of strengthening.     Total Session Time 48 and Timed code 48 minutes   THERAPEUTIC EXERCISE 48 minutes    Benjie Karvonen, PTA  01/19/2022, 12:22

## 2022-01-24 ENCOUNTER — Other Ambulatory Visit: Payer: Self-pay

## 2022-01-24 ENCOUNTER — Ambulatory Visit (HOSPITAL_COMMUNITY)
Admission: RE | Admit: 2022-01-24 | Discharge: 2022-01-24 | Disposition: A | Discharge status: Still In Hospital | Payer: Medicare Other | Source: Ambulatory Visit

## 2022-01-24 NOTE — PT Treatment (Signed)
[facility]  Outpatient Physical Therapy  [place], [address]  [phone]  [phone]    Physical Therapy Treatment Note    Date: 01/24/2022  Patient's Name: Dawn Riley  Date of Birth: 1956/01/09      Visit #/POC:12; 02/07/22  Authorization:med nec      Evaluating Physical Therapist:Debbie Lamothe, PT  PT diagnosis/Reason for Referral:bilat shoulder and thoracic pain  Next Scheduled Physician Appointment:none in respect          Subjective: Patient reports 8/10 pain evening/night of last visit, but notes it was resolved by morning.     Objective: warm up on UBE followed by therex per flow sheet   EXERCISE/ACTIVITY NAME REPETITIONS RESISTANCE Completed on   DOS   Seated thoracic rotation with head neutral   x10  no   Supine wand: flexion  -    -horizontal abd  --ceiling punch   x10 ea    no: added to HEP 12/27/21 without wand    no  no   Seated thoracic extension with ball   x10  no   Sidelying: ER, abd and flexion   RUE: x10, x10, x10    LUE: x10 , x10, x10   1# with RUE on all 3    1# LUE with on al 3   no: held 12/27/21 d/t pain and  12/29/21  No: held 12/27/21 and  12/29/21 d/t pain   MFR   bilat UT, periscap and biceps  No  no   PROM all directions: bilat shoulders    manual no   supine: horizontal abd  ---shoulder extension(pull down)       PNF diagonals    x10    x7 (R), x 7 (L)      x10   Yellow Tband    Yellow Tband    Y  ellow Tband  No: D/C  to standing 12/29/21  no: D/C to standing low rows 12/29/21  No: D/C to standing no resist 12/29/21   HEPeducation  Posture education  Pillow education      no: progressed 01/03/22  no  no   UBE    x5 min  60 rpm yes   Wall wipes x3 directions (up, lateral and across body)  unilateral x5 ea direction  with towel no   Ball rolls vs door CW and CCW  x10 ea bilat  green mediball  no   At door: resisted slide to 1:00, 3:00, 5:00  --Rainbows  x5  bilat  x5 bilat   yellow Tband  Yellow Tband  yes   yes    reverse wall push up with bilat elbow flexion  x5 with 5 sec hold  at door   yes   Standing rows - high and low   --lat pulls x15  x13  x15 Green Tband  Green Tband  Green  tband  Yes  Yes  yes   Standing horizontal abd x8 Yellow tband yes   Standing D2 flexion x10 Red band  yes   Supine  flexion x10 ea  no   MH bilat shoulder x10 min  no   Standing at mat table with PB: resisted:   --Flexion and extension  --1/2 moon: med/lateral directions      2x10 ea  x10 ea direction     Red Tband  red Tband     yes  no   SA foam rolls at door x10  no   Re-assess goals and objectives   no   machine rows x15 20# yes   Standing machine lat pull downs  --seated machine pull downs x13    x10 10#    10# Yes    yes                   Assessment:  She had increased difficulty performing D2 flexion with LUE using Red Tband this visit d/t pain 6.5-7/10. Pain has been  a barrier with progression of strengthening.     STGs3Weeks:  1. Patient will be independent (with PRN use of HEP handout) with progressive HEP to maximize gains from PT.  MET 01/03/22  2. Patient will report less than max pain of 5/10 to aid with participation in physical therapy.  Pain 5  On 01/17/22    LTGs6Weeks:  1. Patient will improve functional ability with Patient Specific Functional Scale score of at least 8.5. Progressing  2. Patient will demonstrate improved bothshoulder AROM to at least 75%to aid in ability to perform ADLs independently. Progressing  3. Patient will demonstrate improved bilateralshoulder strength to at least 4 to 4-/5 throughout to aid in return to previous level of function. MET 01/03/22  4.Patient will demonstrate improved t/s mobility for improved postureProgressing    Plan: Assess if she had better response to treatment this day.     Total Session Time 38 and Timed code 36 minutes   THERAPEUTIC EXERCISE 36  minutes    Benjie Karvonen, PTA

## 2022-01-26 ENCOUNTER — Other Ambulatory Visit: Payer: Self-pay

## 2022-01-26 ENCOUNTER — Ambulatory Visit (HOSPITAL_COMMUNITY)
Admission: RE | Admit: 2022-01-26 | Discharge: 2022-01-26 | Disposition: A | Discharge status: Still In Hospital | Payer: Medicare Other | Source: Ambulatory Visit

## 2022-01-26 NOTE — PT Treatment (Signed)
[facility]  Outpatient Physical Therapy  [place], [address]  [phone]  [phone]    Physical Therapy Treatment Note    Date: 01/26/2022  Patient's Name: Dawn Riley  Date of Birth: Mar 14, 1956      Visit #/POC:13; 02/07/22  Authorization:med nec      Evaluating Physical Therapist:Debbie Lamothe, PT  PT diagnosis/Reason for Referral:bilat shoulder and thoracic pain  Next Scheduled Physician Appointment:none in respect          Subjective: Patient reports she was not nearly as sore with exercises last visit as with visit prior.     Objective: warm up on UBE followed by therex per flow sheet   EXERCISE/ACTIVITY NAME REPETITIONS RESISTANCE Completed on   DOS   Seated thoracic rotation with head neutral   x10  no   Supine wand: flexion  -    -horizontal abd  --ceiling punch   x10 ea    no: added to HEP 12/27/21 without wand    no  no   Seated thoracic extension with ball   x10  no   Sidelying: ER, abd and flexion   RUE: x10, x10, x10    LUE: x10 , x10, x10   1# with RUE on all 3    1# LUE with on al 3   no: held 12/27/21 d/t pain and  12/29/21  No: held 12/27/21 and  12/29/21 d/t pain   MFR   bilat UT, periscap and biceps  No  no   PROM all directions: bilat shoulders    manual no   supine: horizontal abd  ---shoulder extension(pull down)       PNF diagonals    x10    x7 (R), x 7 (L)      x10   Yellow Tband    Yellow Tband    Y  ellow Tband  No: D/C  to standing 12/29/21  no: D/C to standing low rows 12/29/21  No: D/C to standing no resist 12/29/21   HEPeducation  Posture education  Pillow education      no: progressed 01/03/22  no  no   UBE    x5 min  60 rpm yes   Wall wipes x3 directions (up, lateral and across body)  unilateral x5 ea direction  with towel no   Ball rolls vs door CW and CCW  x10 ea bilat  green mediball  no   At door: resisted slide to 1:00, 3:00, 5:00  --Rainbows  x5  bilat  x5 bilat  Red  Tband  Red Tband  yes   yes    reverse wall push up with bilat elbow flexion  x5 with 5 sec hold  at door   yes   Standing rows - high and low   --lat pulls x15  x13  x15 Green Tband  Green Tband  Green  tband  Yes  Yes  yes   Standing horizontal abd x8 Red  tband yes   Standing D2 flexion x10 Red band  yes   Supine  flexion x10 ea  no   MH bilat shoulder x10 min  no   Standing at mat table with PB: resisted:   --Flexion and extension  --1/2 moon: med/lateral directions      2x10 ea  x10 ea direction     Red Tband  red Tband     no  no   SA foam rolls at door  x10  no   Re-assess goals and objectives   no   machine rows 2x10 10# yes   Standing machine lat pull downs  --seated machine pull downs x10    x10 10#    10# Yes    yes                   Assessment:  She was able to progress all exercises still performed using Yellow Tband with Red Tband this day and no real c/o difficulty.       STGs3Weeks:  1. Patient will be independent (with PRN use of HEP handout) with progressive HEP to maximize gains from PT.  MET 01/03/22  2. Patient will report less than max pain of 5/10 to aid with participation in physical therapy.  Pain 5  On 01/17/22    LTGs6Weeks:  1. Patient will improve functional ability with Patient Specific Functional Scale score of at least 8.5. Progressing  2. Patient will demonstrate improved bothshoulder AROM to at least 75%to aid in ability to perform ADLs independently. Progressing  3. Patient will demonstrate improved bilateralshoulder strength to at least 4 to 4-/5 throughout to aid in return to previous level of function. MET 01/03/22  4.Patient will demonstrate improved t/s mobility for improved postureProgressing    Plan: Will re-assess goals next visit.     Total Session Time 30 and Timed code 30 minutes   THERAPEUTIC EXERCISE 30  Minutes    Benjie Karvonen, PTA

## 2022-01-31 ENCOUNTER — Other Ambulatory Visit: Payer: Self-pay

## 2022-01-31 ENCOUNTER — Ambulatory Visit (HOSPITAL_COMMUNITY)
Admission: RE | Admit: 2022-01-31 | Discharge: 2022-01-31 | Disposition: A | Discharge status: Still In Hospital | Payer: Medicare Other | Source: Ambulatory Visit

## 2022-01-31 NOTE — PT Treatment (Signed)
[facility]  Outpatient Physical Therapy  [place], [address]  [phone]  [phone]    Physical Therapy Treatment Note    Date: 01/31/2022  Patient's Name: Dawn Riley  Date of Birth: [birth year]      Visit #/POC:13; 02/07/22  Authorization:med nec      Evaluating Physical Therapist:Debbie Jermani Eberlein, PT  PT diagnosis/Reason for Referral:bilat shoulder and thoracic pain  Next Scheduled Physician Appointment:none in respect        Subjective: patient is reporting chest pain intermittent since last night. Described as a needle/stabbing session. States it started during the night. Last episode was reported 11:15 stabbing the other episodes faint with feeling of a contraction. To see her PCP after PT. No reports of chest pain when she arrived to PT   Shoulder pain is better. Left is worse than the right. Pain level is a 6 today. Pain range 4-6 past week. Difficulty doing the dishes or donning her bra - can do easier with less discomfort. Reports compliance with HEP.     Objective: BP left UE 142/79   HR 64 regular   No chest pain during the readings   AROM   Right shoulder flex 106 (was 115 last assessment), ext 50 (was 55), abd 96 (was 108)   Left shoulder flex 105 (was 125), ext 40 (was 40), abd 96 (was 112)    MMT for available ROM flex BUE 4-, abd 4- pain BUE, IR 4 BUE, ER 4    Warm up on UBE followed by reassessment and some exercises     EXERCISE/ACTIVITY NAME REPETITIONS RESISTANCE Completed on   DOS   Seated thoracic rotation with head neutral   x10  no   Supine wand: flexion  -    -horizontal abd  --ceiling punch   x10 ea    no: added to HEP 12/27/21 without wand    no  no   Seated thoracic extension with ball   x10  no   Sidelying: ER, abd and flexion   RUE: x10, x10, x10    LUE: x10 , x10, x10   1# with RUE on all 3    1# LUE with on al 3  no: held 12/27/21 d/t pain and 12/29/21  No: held 12/27/21 and 12/29/21 d/t pain    MFR   bilat UT, periscap and biceps  No  no   PROM all directions: bilat shoulders    manual no   supine: horizontal abd  ---shoulder extension(pull down)      PNF diagonals    x10    x7 (R), x 7 (L)      x10   Yellow Tband    Yellow Tband    Y  ellow Tband  No: D/C to standing 12/29/21  no: D/C to standing low rows 12/29/21  No: D/C to standing no resist 12/29/21   HEPeducation  Posture education  Pillow education      no: progressed 01/03/22  no  no   UBE    x5 min  60 rpm yes   Wall wipes x3 directions (up, lateral and across body) unilateral x5 ea direction with towel no   Ball rolls vs door CW and CCW x10 ea bilat green mediball no   At door: resisted slide to 1:00, 3:00, 5:00  --Rainbows x5  bilat  x5 bilat Red Tband  Red Tband no  yes   reverse wall push up with  bilat elbow flexion x5 with 5 sec hold at door  no   Standing rows - high and low   --lat pulls x15  x13  x15 Green Tband  Green Tband  Green  tband  no  no  no   Standing horizontal abd x8 Red  tband no   Standing D2 flexion x10 Red band  no   Supine flexion x10 ea  no   MH bilat shoulder x10 min  no   Standing at mat table with PB: resisted:   --Flexion and extension  --1/2 moon: med/lateral directions     2x10 ea  x10 ea direction     Red Tband  red Tband     no  no   SA foam rolls at door x10  no   Re-assess goals and objectives   no   machine rows 2x10 15# yes   Standing machine lat pull downs  --seated machine pull downs 2 x10    2x10 10#    10# Yes    yes                     Assessment:  Patient reported no chest pain during session. ROM bilateral shoulders has decreased since last assessment. Shoulder strength has increased for available AROM. Patient did not complete full program d/t worried about her chest pain - seeing PCP after PT. Posture has improved. Has not been able to tolerate progression with the theraband. She did tolerate increased reps on lat pulls from 10 to 20 and  increased weight on row machine from 10 to 15 #  STGs3Weeks:  1. Patient will be independent (with PRN use of HEP handout) with progressive HEP to maximize gains from PT.MET 01/03/22  2. Patient will report less than max pain of 5/10 to aid with participation in physical therapy. Pain 5  On 01/17/22    LTGs6Weeks:  1. Patient will improve functional ability with Patient Specific Functional Scale score of at least 8.5.Progressing  2. Patient will demonstrate improved bothshoulder AROM to at least 75%to aid in ability to perform ADLs independently.Progressing  3. Patient will demonstrate improved bilateralshoulder strength to at least 4 to 4-/5 throughout to aid in return to previous level of function.MET 01/03/22  4.Patient will demonstrate improved t/s mobility for improved postureProgressing  Plan: complete POC and then discharge with HEP     Total Session Time 35 and Timed code minutes 35  THERAPEUTIC EXERCISE 35 minutes      Zenia Resides, PT  01/31/2022, 13:04

## 2022-02-02 ENCOUNTER — Ambulatory Visit
Admission: RE | Admit: 2022-02-02 | Discharge: 2022-02-02 | Disposition: A | Discharge status: Still In Hospital | Payer: Medicare Other | Source: Ambulatory Visit | Attending: FAMILY PRACTICE | Admitting: FAMILY PRACTICE

## 2022-02-02 ENCOUNTER — Other Ambulatory Visit: Payer: Self-pay

## 2022-02-02 NOTE — PT Treatment (Signed)
[facility]  Outpatient Physical Therapy  [place], [address]  [phone]  [phone]    Physical Therapy Treatment Note    Date: 02/02/2022  Patient's Name: Dawn Riley  Date of Birth: 1956-01-10    PHYSICAL THERAPY DISCHARGE NOTE  Patient attended 13 sessions of PT for bilateral shoulder pain from 12/08/21 to 02/07/22. She cancelled her last session on 02/07/22. Treatment included ROM and strengthening with HEP. Patient has met her max gains at this time. ROM varies per visit. Her last attended session her ROM was slightly better - see note below. Patient has been unable to progress past red theraband despite MMT showing good strength. Pain remains limiting factor and pain has not decreased. Patient will be discharged at this time d/t inability to progress her strength program.       Visit #/POC:13; 02/07/22  Authorization:med nec      Evaluating Physical Therapist:Debbie Lamothe, PT  PT diagnosis/Reason for Referral:bilat shoulder and thoracic pain  Next Scheduled Physician Appointment:none in respect        Subjective: States PCP determined patient had a pulled pectoralis muscle, given Celebrex and it has helped. Denies having any issues today.     Objective: Warm up on UBE followed by reassessment of ROM and strength along with strengthening exercises   AROM   Right shoulder flex 140 (was 106 last assessment), ext 60 (was 50), abd 136 (was 96)   Left shoulder flex 130 (was 105), ext 50 (was 40), abd 155 (was 96)    MMT for available ROM flex BUE 5, abd 5 pain BUE, IR 5 BUE, ER 5        EXERCISE/ACTIVITY NAME REPETITIONS RESISTANCE Completed on   DOS   Seated thoracic rotation with head neutral   x10  no   Supine wand: flexion  -    -horizontal abd  --ceiling punch   x10 ea    no: added to HEP 12/27/21 without wand    no  no   Seated thoracic extension with ball   x10  no   Sidelying: ER, abd and flexion   RUE: x10, x10,  x10    LUE: x10 , x10, x10   1# with RUE on all 3    1# LUE with on al 3  no: held 12/27/21 d/t pain and 12/29/21  No: held 12/27/21 and 12/29/21 d/t pain   MFR   bilat UT, periscap and biceps  No  no   PROM all directions: bilat shoulders    manual no   supine: horizontal abd  ---shoulder extension(pull down)      PNF diagonals    x10    x7 (R), x 7 (L)      x10   Yellow Tband    Yellow Tband    Y  ellow Tband  No: D/C to standing 12/29/21  no: D/C to standing low rows 12/29/21  No: D/C to standing no resist 12/29/21   HEPeducation  Posture education  Pillow education      no: progressed 01/03/22  no  no   UBE    x5 min  L3 yes   Wall wipes x3 directions (up, lateral and across body) unilateral x5 ea direction with towel no   Ball rolls vs door CW and CCW x10 ea bilat green mediball no   At door: resisted slide to 1:00, 3:00, 5:00  --Rainbows x5  bilat    x5  bilat Red Tband    Red Tband yes    yes   reverse wall push up with bilat elbow flexion x5 with 5 sec hold at door  yes   Standing rows - high and low   --lat pulls x15  x13  x15 Green Tband  Green Tband  Green  tband  yes  yes  yes   Standing horizontal abd x8 Red  tband no   Standing D2 flexion x10 Red band  no   Supine flexion x10 ea  no   MH bilat shoulder x10 min  no   Standing at mat table with PB: resisted:   --Flexion and extension  --1/2 moon: med/lateral directions     2x10 ea  x10 ea direction     Red Tband  red Tband     no  no   SA foam rolls at door x10  no   Re-assess goals and objectives   no   machine rows 1x10  1x15 15#  15# Yes  yes   Standing machine lat pull downs  --seated machine pull downs 2 x10    2x10 10#    10# Yes    yes                     Assessment:  She has increased AROM bilat even better than when assessed prior to last visit, she also has 5/5 strength throughout BUE's. Patient has no c/o chest pain during treatment, states she feels great today.      STGs3Weeks:  1. Patient will be independent (with PRN use of HEP handout) with progressive HEP to maximize gains from PT.MET 01/03/22  2. Patient will report less than max pain of 5/10 to aid with participation in physical therapy. Pain 5  On 01/17/22    LTGs6Weeks:  1. Patient will improve functional ability with Patient Specific Functional Scale score of at least 8.5.Progressing  2. Patient will demonstrate improved bothshoulder AROM to at least 75%to aid in ability to perform ADLs independently.Progressing  3. Patient will demonstrate improved bilateralshoulder strength to at least 4 to 4-/5 throughout to aid in return to previous level of function.MET 01/03/22  4.Patient will demonstrate improved t/s mobility for improved postureProgressing    Plan:  Will review HEP next visit as this will end her current POC.     Total Session Time 40 and Timed code 35 minutes   THERAPEUTIC EXERCISE 35 minutes      Benjie Karvonen, PTA

## 2022-02-07 ENCOUNTER — Other Ambulatory Visit: Payer: Self-pay

## 2022-02-07 ENCOUNTER — Ambulatory Visit
Admission: RE | Admit: 2022-02-07 | Discharge: 2022-02-07 | Disposition: A | Discharge status: Still In Hospital | Payer: Medicare Other | Source: Ambulatory Visit | Attending: FAMILY PRACTICE | Admitting: FAMILY PRACTICE

## 2022-02-07 ENCOUNTER — Encounter (HOSPITAL_COMMUNITY): Payer: Self-pay

## 2022-02-07 NOTE — PT Treatment (Addendum)
[facility]  Outpatient Physical Therapy  [place], [address]  [phone]  [phone]    Physical Therapy Treatment Note    Date: 02/07/2022  Patient's Name: Dawn Riley  Date of Birth: January 06, 1956            Patient called inquiring if there were later appt's as she is not feeling well, but did not want to miss as this was her last day per POC. She requested PTA Seth Bake return her phone call. She has reached her max potential with PT. Via phone patient did update her Patient-Specific Functional Score: Sleep  9, wiping self  9, dressing 8.5 with total score being 8.83. Goals have been met as stated in last treatment note with the addition of her PSFS assessed this day verbally via phone, she has also met LTG with AROM bilat and improved posture.   See PT note for plan.     Benjie Karvonen, PTA  02/07/2022, 14:30

## 2022-02-14 DIAGNOSIS — I340 Nonrheumatic mitral (valve) insufficiency: Secondary | ICD-10-CM | POA: Insufficient documentation

## 2022-02-19 ENCOUNTER — Other Ambulatory Visit: Payer: Self-pay

## 2022-02-19 ENCOUNTER — Ambulatory Visit (INDEPENDENT_AMBULATORY_CARE_PROVIDER_SITE_OTHER): Payer: Medicare Other | Admitting: OTOLARYNGOLOGY

## 2022-02-19 ENCOUNTER — Encounter (INDEPENDENT_AMBULATORY_CARE_PROVIDER_SITE_OTHER): Payer: Self-pay | Admitting: OTOLARYNGOLOGY

## 2022-02-19 VITALS — Ht 64.0 in | Wt 240.0 lb

## 2022-02-19 DIAGNOSIS — H9313 Tinnitus, bilateral: Secondary | ICD-10-CM

## 2022-02-19 DIAGNOSIS — H6123 Impacted cerumen, bilateral: Secondary | ICD-10-CM

## 2022-02-19 DIAGNOSIS — J309 Allergic rhinitis, unspecified: Secondary | ICD-10-CM

## 2022-02-19 DIAGNOSIS — H6983 Other specified disorders of Eustachian tube, bilateral: Secondary | ICD-10-CM

## 2022-02-19 NOTE — H&P (Signed)
[facility], [facility]  [address]  [place] [address]  Phone: [phone]  Fax: [phone]      Encounter Date: 02/19/2022    Patient ID: LOCKLYN HENRIQUEZ  MRN: [id number]    DOB: 06/02/1956  Age: 66 y.o. female     Progress Note       Referring Provider:  Manuella Ghazi, DO    Reason for Visit:   Chief Complaint   Patient presents with   . Ear Problem(s)     Rc on ears for wax removal. States loud noises hurt ears.        History of Present Illness:  KADIJA CRUZEN is a 66 y.o. female who is FU on ears. Music seems loud to her. She did not try nasacort due to potential side effects, but would like to try it. Takes Claritin PRN for drainage.       Patient History:  Patient Active Problem List   Diagnosis   . Anxiety   . Arthritis of carpometacarpal (CMC) joint of right thumb   . Atrial fibrillation (CMS HCC)   . Attention deficit hyperactivity disorder (ADHD)   . Carpal tunnel syndrome of left wrist   . Chest pain   . Chronic pain of both shoulders   . DDD (degenerative disc disease), lumbar   . Disorder of bone density and structure, unspecified   . HLD (hyperlipidemia)   . Hyperglycemia   . Impingement syndrome of shoulder region   . Morton metatarsalgia   . Neck pain   . Nontraumatic complete tear of left rotator cuff   . Obesity   . OSA (obstructive sleep apnea)   . Ovarian cyst   . Osteoarthritis of spine with radiculopathy, cervical region   . Palpitations   . Postmenopause   . Primary osteoarthritis of both knees   . PVCs (premature ventricular contractions)   . Rotator cuff tear, right   . Status post total left knee replacement using cement   . Tachycardia   . Tear of left supraspinatus tendon   . Mitral valve regurgitation     Current Outpatient Medications   Medication Sig   . fluticasone propionate (FLONASE) 50 mcg/actuation Nasal Spray, Suspension fluticasone propionate 50 mcg/actuation nasal spray,suspension   USE 1 SPRAY(S) IN EACH NOSTRIL TWICE DAILY   . loratadine (CLARITIN) 5 mg/5 mL  Oral Solution Take 10 mL (10 mg total) by mouth   . LORazepam (ATIVAN) 0.5 mg Oral Tablet lorazepam 0.5 mg tablet   TAKE 1 TABLET BY MOUTH ONCE DAILY      Allergies   Allergen Reactions   . Sulfamethoxazole  Other Adverse Reaction (Add comment)     Patient had numbness in her extremities   . Azelastine Mental Status Effect     Felt anxious and could not sleep.     History reviewed. No pertinent past medical history.  History reviewed. No pertinent surgical history.  Family Medical History:    None         Social History     Tobacco Use   . Smoking status: Never   . Smokeless tobacco: Never       Review of Systems:  Review of Systems    Physical Exam:  Ht 1.626 m (5\' 4" )   Wt 109 kg (240 lb)   BMI 41.20 kg/m       Physical Exam  Constitutional:       Appearance: Normal appearance. She is well-developed, well-groomed and normal  weight.   HENT:      Head: Normocephalic and atraumatic.      Right Ear: Hearing, tympanic membrane, ear canal and external ear normal. There is impacted cerumen.      Left Ear: Hearing, tympanic membrane, ear canal and external ear normal. There is impacted cerumen.      Nose: Septal deviation and mucosal edema present.      Right Turbinates: Enlarged.      Left Turbinates: Enlarged.      Mouth/Throat:      Lips: Pink.      Mouth: Mucous membranes are moist.      Pharynx: Oropharynx is clear. Uvula midline.   Eyes:      Extraocular Movements: Extraocular movements intact.   Neck:      Trachea: Phonation normal.   Pulmonary:      Effort: Pulmonary effort is normal.   Musculoskeletal:      Cervical back: Normal range of motion and neck supple.   Lymphadenopathy:      Cervical: No cervical adenopathy.   Skin:     General: Skin is warm.   Neurological:      Mental Status: She is alert and oriented to person, place, and time.      Cranial Nerves: Cranial nerves 2-12 are intact. No facial asymmetry.   Psychiatric:         Attention and Perception: Attention normal.         Mood and Affect: Mood  normal.         Speech: Speech normal.         Behavior: Behavior normal. Behavior is cooperative.         Assessment:  ENCOUNTER DIAGNOSES     ICD-10-CM   1. 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 ETD (Eustachian tube dysfunction), bilateral  H69.83   3. Bilateral impacted cerumen  H61.23   4. Allergic rhinitis  J30.9       Plan:  Medical records reviewed on 02/19/2022.  AU cerumen debrided. Will get audiogram. Cont Claritin. Pt wishes to try Nasacort (already has at home)  Orders Placed This Encounter   . 82500 - REMOVAL IMPACTED CERUMEN W/ INSTRUMENT, UNILATERAL (AMB ONLY-PD)   . 37048 - NASAL ENDOSCOPY DIAGNOSTIC UNILATERAL OR BILATERAL (AMB ONLY)   . AMB PRN REFERRAL EXTERNAL AUDIOLOGIST     Return for Follow up after audiogram.    Marcelline Deist, PA-C  02/19/2022, 09:39   The advanced practice clinician's documentation was reviewed/amended in its entirety with the assessment and plan portion completely performed independently by me during this separate encounter.

## 2022-02-19 NOTE — Procedures (Signed)
[facility]  [address]  [place] [address]    Procedure Note    Name: Dawn Riley MRN:  [id number]   Date: 02/19/2022 Age: 66 y.o.       [phone] - REMOVAL IMPACTED CERUMEN W/ INSTRUMENT, UNILATERAL (AMB ONLY-PD)  Performed by: Dia Sitter, DO  Authorized by: Dia Sitter, DO     Time Out:     Immediately before the procedure, a time out was called:  Yes    Patient verified:  Yes    Procedure Verified:  Yes    Site Verified:  Yes  Documentation:      Procedure: Cerumen cleaning  Pre-op Dx: Cerumen impaction      Bilateral EAC(s) examined under binocular microscopy.  Cerumen and/or debris was cleaned from the canal(s) using curettes, suction, and alligator forceps.  Patient tolerated procedure well.   [id number] - NASAL ENDOSCOPY DIAGNOSTIC UNILATERAL OR BILATERAL (AMB ONLY)  Performed by: Dia Sitter, DO  Authorized by: Dia Sitter, DO     Time Out:     Immediately before the procedure, a time out was called:  Yes    Patient verified:  Yes    Procedure Verified:  Yes    Site Verified:  Yes  Documentation:      Indications for procedure: Otalgia    Anesthesia: Oxymetazoline nasal spray    Description: Nasal endoscopy with rigid scope was performed with examination of the  septum, inferior, middle, and superior meatus, turbinates, sphenoethmoidal recess, and nasopharynx.     There were no polyps, pus, or granulation tissue noted.  ET orifices and nasopharynx were normal.     Findings: Septal deviation, allergic changes    The patient tolerated the procedure well.              Dia Sitter, DO

## 2022-03-14 ENCOUNTER — Telehealth (INDEPENDENT_AMBULATORY_CARE_PROVIDER_SITE_OTHER): Payer: Self-pay | Admitting: OTOLARYNGOLOGY

## 2022-03-14 NOTE — Telephone Encounter (Signed)
Pt requesting a call back about a medication that was recommended to her

## 2022-03-14 NOTE — Telephone Encounter (Signed)
Patient called and states that since starting flunisolide, she has had increased PND and burning sensation in nose when using the spray. Wanting to know if there is another nasal spray she can use. Has tried Flonase. Nasacort not covered. Ok to try qnasl? Patient requested that i ask before sending in. I informed the patient that dr weitzel was on vacation until Monday. She voiced understanding.

## 2022-03-19 MED ORDER — QNASL 80 MCG/ACTUATION NASAL AEROSOL SPRAY
80.0000 ug | INHALATION_SPRAY | Freq: Every day | NASAL | 3 refills | Status: DC
Start: 2022-03-19 — End: 2022-03-21

## 2022-03-21 ENCOUNTER — Other Ambulatory Visit (INDEPENDENT_AMBULATORY_CARE_PROVIDER_SITE_OTHER): Payer: Self-pay | Admitting: OTOLARYNGOLOGY

## 2022-03-21 MED ORDER — QNASL 80 MCG/ACTUATION NASAL AEROSOL SPRAY
80.0000 ug | INHALATION_SPRAY | Freq: Every day | NASAL | 3 refills | Status: DC
Start: 2022-03-21 — End: 2022-04-16

## 2022-04-16 ENCOUNTER — Encounter (INDEPENDENT_AMBULATORY_CARE_PROVIDER_SITE_OTHER): Payer: Self-pay | Admitting: OTOLARYNGOLOGY

## 2022-04-16 ENCOUNTER — Encounter (INDEPENDENT_AMBULATORY_CARE_PROVIDER_SITE_OTHER): Payer: Medicare Other | Admitting: OTOLARYNGOLOGY

## 2022-04-16 ENCOUNTER — Ambulatory Visit (INDEPENDENT_AMBULATORY_CARE_PROVIDER_SITE_OTHER): Payer: Medicare Other | Admitting: OTOLARYNGOLOGY

## 2022-04-16 ENCOUNTER — Other Ambulatory Visit: Payer: Self-pay

## 2022-04-16 VITALS — Ht 64.0 in | Wt 240.0 lb

## 2022-04-16 DIAGNOSIS — J309 Allergic rhinitis, unspecified: Secondary | ICD-10-CM

## 2022-04-16 DIAGNOSIS — H6983 Other specified disorders of Eustachian tube, bilateral: Secondary | ICD-10-CM

## 2022-04-16 DIAGNOSIS — H9313 Tinnitus, bilateral: Secondary | ICD-10-CM

## 2022-04-16 NOTE — H&P (Signed)
[facility]  [facility]    Progress Note    Name: Dawn Riley MRN:  [id number]   Date: 04/16/2022 Age: 66 y.o.          Follow Up      Subjective:   Chief Complaint:   Follow-up After Testing (Rc After audio)       History of Present Illness:  Dawn Riley is a 66 y.o. old female who presents to the clinic for follow-up after audiogram.  Patient has normal hearing and tymps. She was not able to nasacort. She is using Flonase but it has not worked.     Review of Systems     Physical Exam:     Vitals:    04/16/22 1425   Weight: 109 kg (240 lb)   Height: 1.626 m (5\' 4" )   BMI: 41.28      ENT Physical Exam  Constitutional  Appearance: patient appears well-developed, well-nourished and well-groomed,  Communication/Voice: communication appropriate for developmental age; vocal quality normal;  Head and Face  Appearance: head appears normal, face appears normal and face appears atraumatic;  Palpation: facial palpation normal;  Salivary: glands normal;  Ear  Hearing: intact;  Auricles: right auricle normal; left auricle normal;  External Mastoids: right external mastoid normal; left external mastoid normal;  Ear Canals: right ear canal normal; left ear canal normal;  Tympanic Membranes: right tympanic membrane normal; left tympanic membrane normal;  Nose  External Nose: nares patent bilaterally; external nose normal;  Internal Nose: bilateral intranasal mucosa edematous; nasal septal deviation present; bilateral inferior turbinates with hypertrophy;  Oral Cavity/Oropharynx  Lips: normal;  Teeth: normal;  Gums: gingiva normal;  Tongue: normal;  Oral mucosa: normal;  Hard palate: normal;  Neck  Neck: neck normal; neck palpation normal;  Thyroid: thyroid normal;  Respiratory  Inspection: breathing unlabored; normal breathing rate;  Lymphatic  Palpation: lymph nodes normal;  Neurovestibular  Mental Status: alert and oriented;  Psychiatric: mood normal; affect is appropriate;  Cranial Nerves: cranial nerves intact;      Assessment and Plan:       ICD-10-CM    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 ETD (Eustachian tube dysfunction), bilateral  H69.83       3. Allergic rhinitis, unspecified seasonality, unspecified trigger  J30.9         Will start qnasl   Audiogram reviewed WNL.       Follow up:  Return in about 3 months (around 07/17/2022).    13/03/2022, DO

## 2022-05-03 ENCOUNTER — Telehealth (INDEPENDENT_AMBULATORY_CARE_PROVIDER_SITE_OTHER): Payer: Self-pay | Admitting: OTOLARYNGOLOGY

## 2022-05-03 NOTE — Telephone Encounter (Signed)
Dawn Riley would like you to call her   Thanks

## 2022-05-04 ENCOUNTER — Other Ambulatory Visit (INDEPENDENT_AMBULATORY_CARE_PROVIDER_SITE_OTHER): Payer: Self-pay | Admitting: OTOLARYNGOLOGY

## 2022-05-09 ENCOUNTER — Other Ambulatory Visit (INDEPENDENT_AMBULATORY_CARE_PROVIDER_SITE_OTHER): Payer: Self-pay | Admitting: OTOLARYNGOLOGY

## 2022-05-09 MED ORDER — QNASL 80 MCG/ACTUATION NASAL AEROSOL SPRAY
80.0000 ug | INHALATION_SPRAY | Freq: Every day | NASAL | 3 refills | Status: DC
Start: 2022-05-09 — End: 2022-07-23

## 2022-06-18 IMAGING — MR MRI SHOULDER LT W/O CONTRAST
6 of 7 series · 29 of 40 positions shown · non-contrast
Comparison: None available.

﻿EXAM:  [id number]   MRI SHOULDER RT W/O CONTRAST,MRI SHOULDER LT W/O CONTRAST
INDICATION: Bilateral shoulder pain.
TECHNIQUE: Noncontrast multiplanar, multisequence MRI was performed.

[Series 6: T1 · oblique · right · 4.0mm · 0.31mm/px · 5 of 22 slices shown]
[im 1/22]
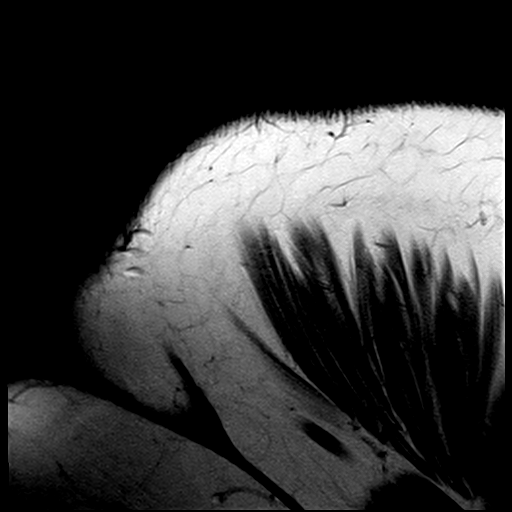
[im 6/22]
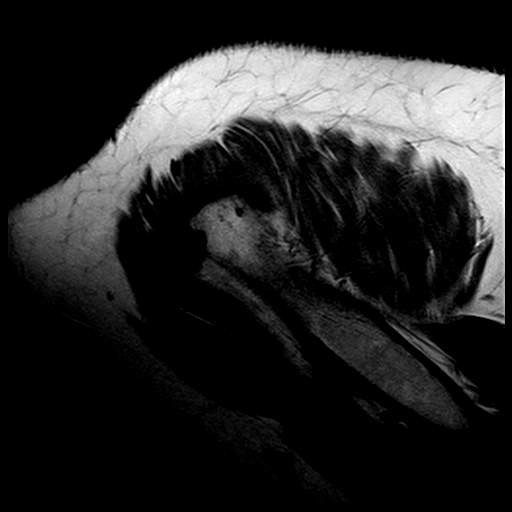
[im 11/22]
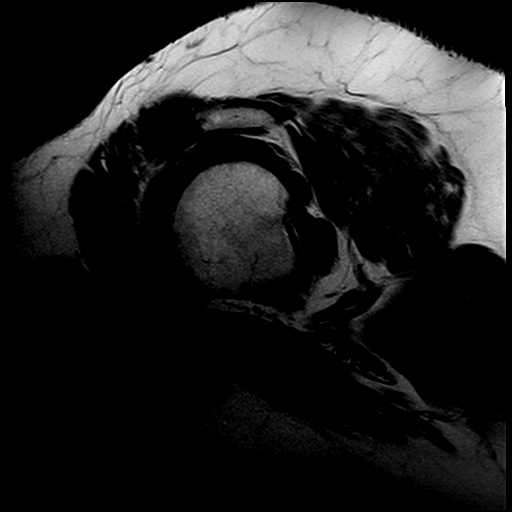
[im 16/22]
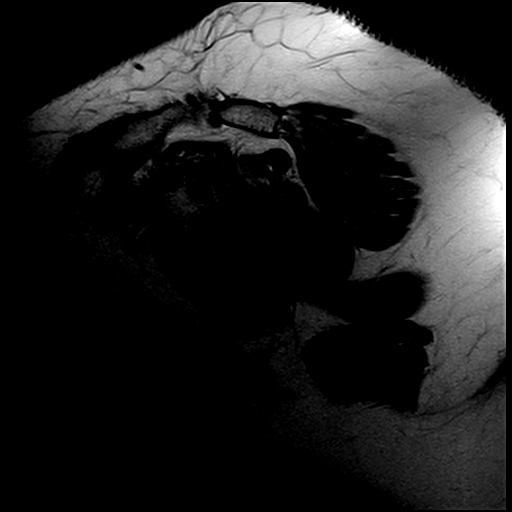
[im 22/22]
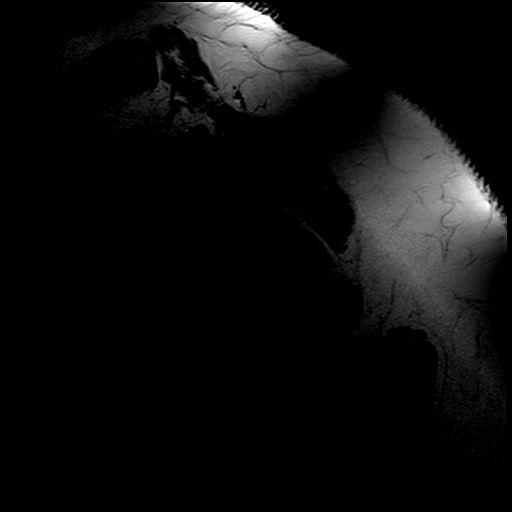

[Series 7: T2 · oblique · right · 4.0mm · 0.42mm/px · 5 of 22 slices shown]
[im 1/22]
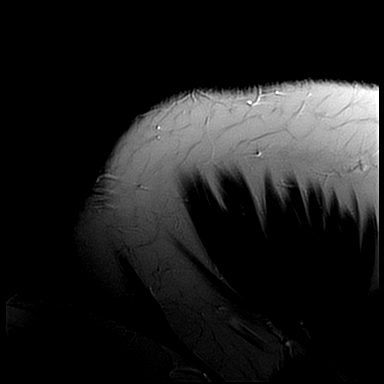
[im 6/22]
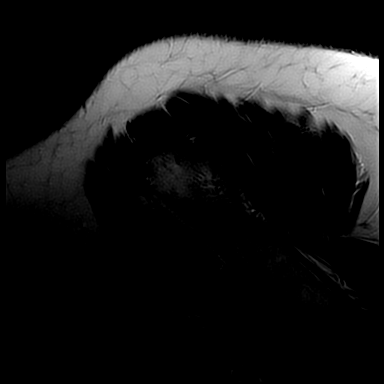
[im 11/22]
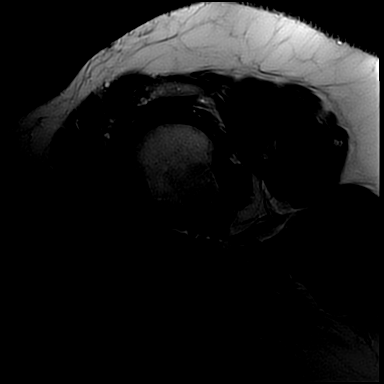
[im 16/22]
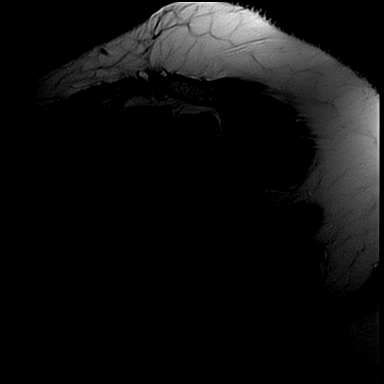
[im 22/22]
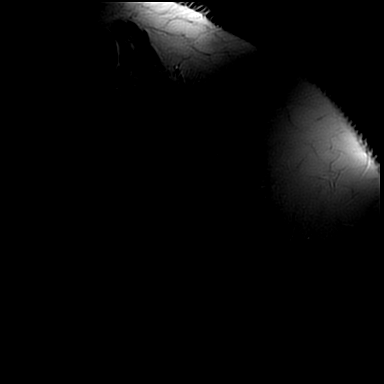

[Series 8: STIR · oblique · right · 4.0mm · 0.36mm/px · 1 of 22 slices shown]
[im 1/22]
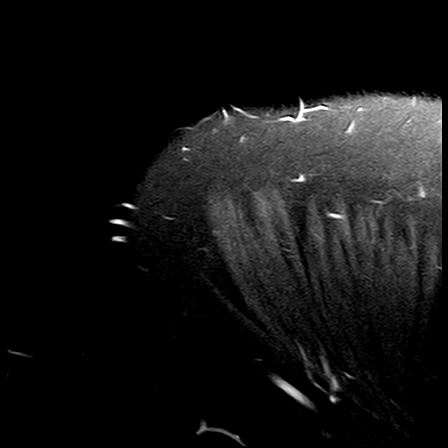

[Series 9: PD fat-sat · axial · right · 4.0mm · 0.36mm/px · z∈[-35,+73]mm · 6 of 24 slices shown (1 of 2)]
[im 1/24]
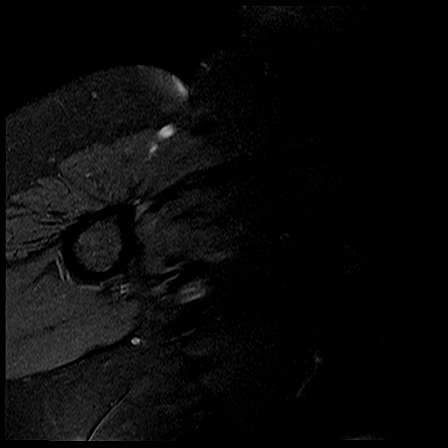
[im 5/24]
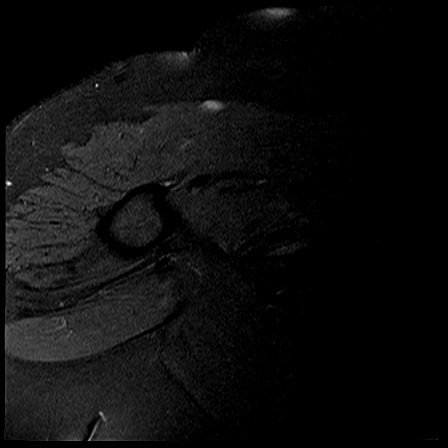
[im 10/24]
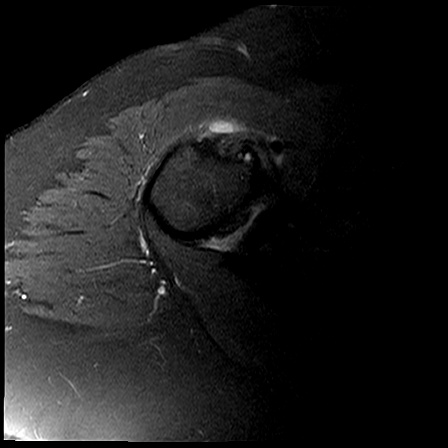
[im 14/24]
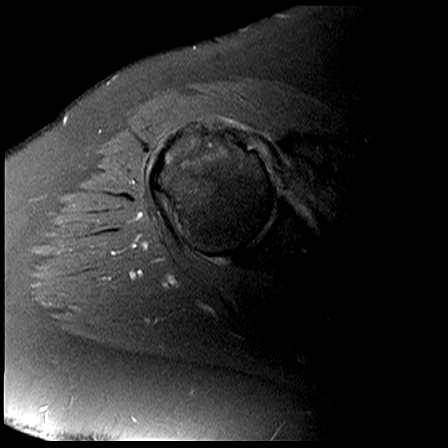
[im 19/24]
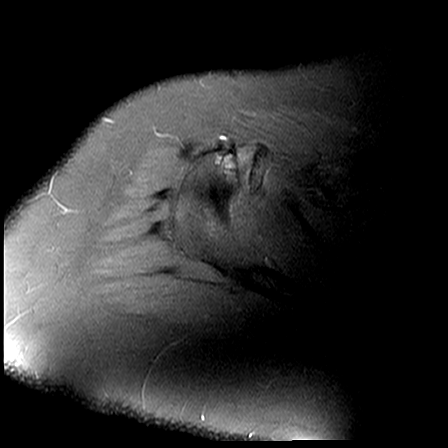
[im 24/24]
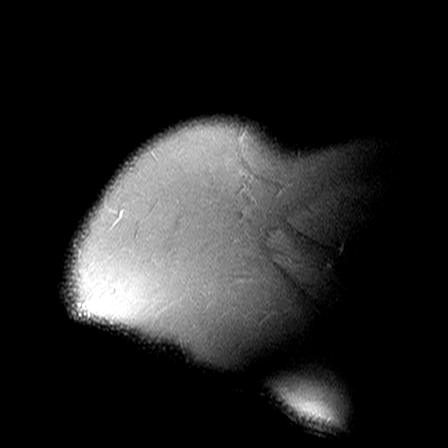

[Series 10: T2 fat-sat · axial · right · 4.0mm · 0.42mm/px · z∈[-35,+73]mm · 6 of 24 slices shown]
[im 1/24]
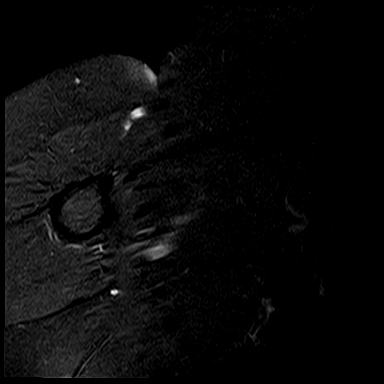
[im 5/24]
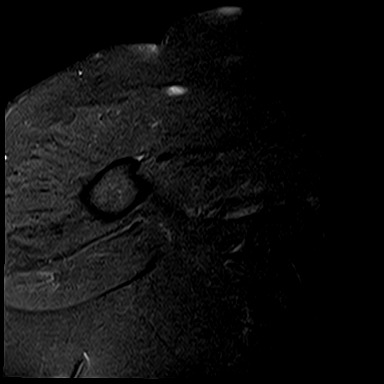
[im 10/24]
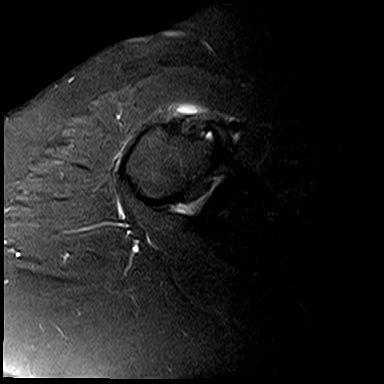
[im 14/24]
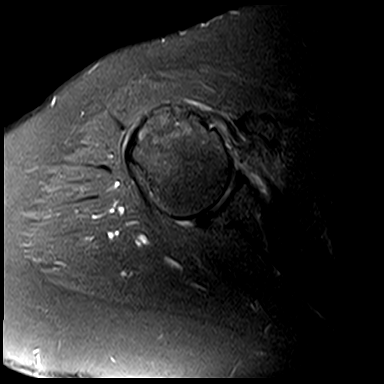
[im 19/24]
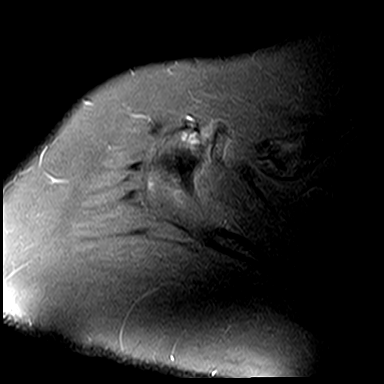
[im 24/24]
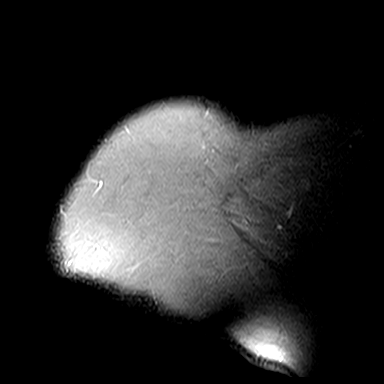

[Series 11: PD fat-sat · oblique · right · 4.0mm · 0.50mm/px · 6 of 23 slices shown (2 of 2)]
[im 1/23]
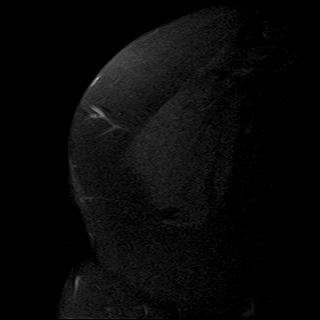
[im 5/23]
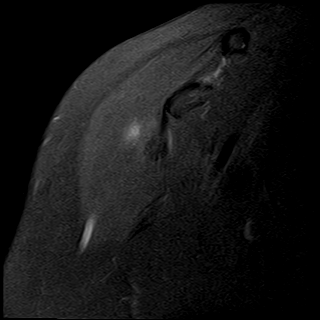
[im 9/23]
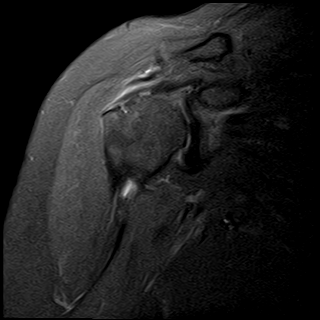
[im 14/23]
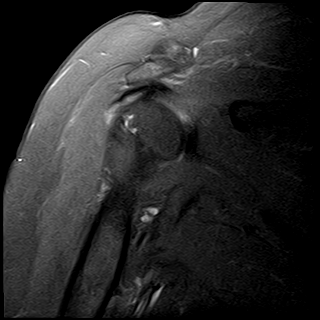
[im 18/23]
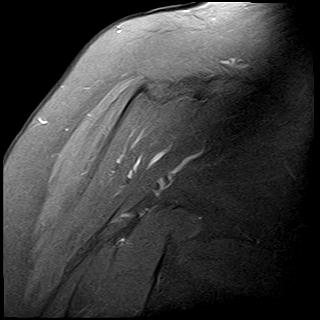
[im 23/23]
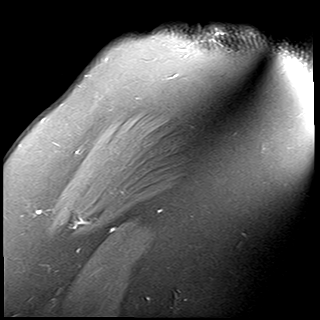

[29 of 40 positions shown; findings below may reference images not displayed]

FINDINGS: RIGHT SHOULDER:  There is a full thickness tear of the supraspinatus tendon with slight retraction and laxity of the supraspinatus tendon.  

There is a partial tear of the infraspinatus tendon.  There is also a partial tear of the subscapularis tendon.  

Moderate degenerative changes are noted.  There is a small joint effusion with fluid in the subacromial-subdeltoid bursa.  There are moderate degenerative changes of the acromioclavicular joint with impingement on the supraspinatus musculotendinous junction.  No fracture or dislocation is seen. 

LEFT SHOULDER:  There is a large amount of fluid distending the subscapularis bursa raising the possibility of bursitis.  

There is a partial tear of the supraspinatus tendon.  The infraspinatus tendon appears intact.  There is a small amount of fluid in the subacromial-subdeltoid bursa.  

No fracture or dislocation is seen.
IMPRESSION: 1. Rotator cuff tear of the right shoulder.

2. Rotator cuff tear of the left shoulder.

3. Large amount of fluid in the subscapularis bursa of the left shoulder raising the possibility of bursitis.

## 2022-06-18 IMAGING — MR MRI SHOULDER RT W/O CONTRAST
6 series · 39 of 40 positions shown · non-contrast
Comparison: None available.

﻿EXAM:  [id number]   MRI SHOULDER RT W/O CONTRAST,MRI SHOULDER LT W/O CONTRAST
INDICATION: Bilateral shoulder pain.
TECHNIQUE: Noncontrast multiplanar, multisequence MRI was performed.

[Series 6: T1 · oblique · left · 4.0mm · 0.36mm/px · 6 of 22 slices shown]
[im 1/22]
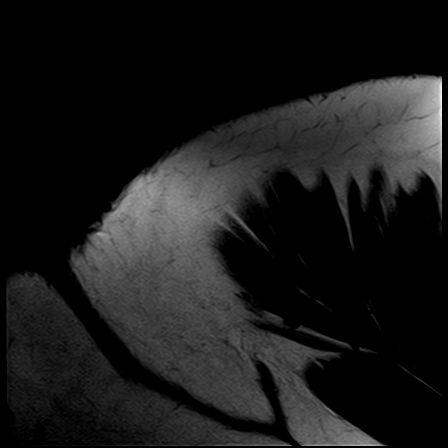
[im 5/22]
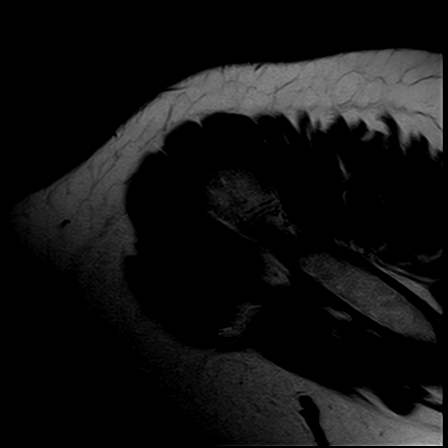
[im 9/22]
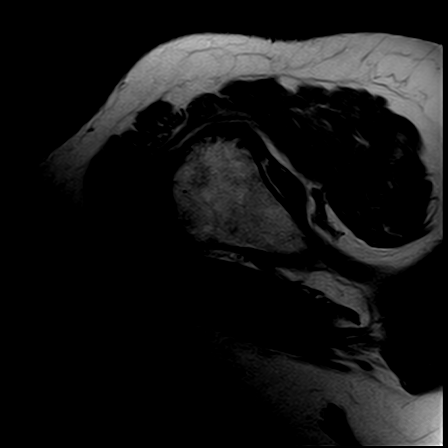
[im 13/22]
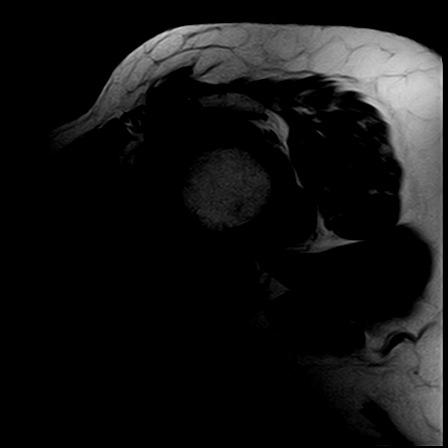
[im 17/22]
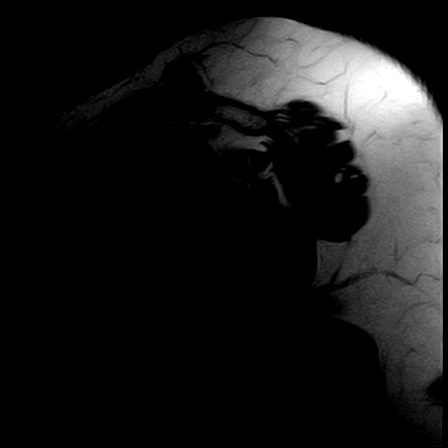
[im 22/22]
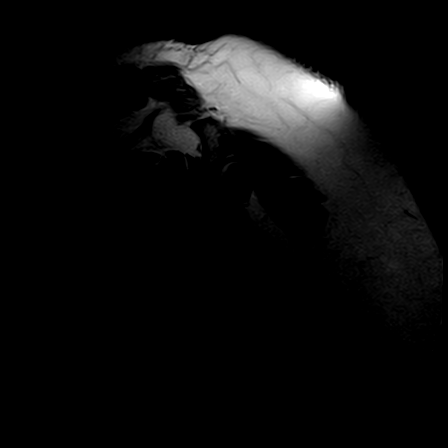

[Series 7: STIR · oblique · left · 4.0mm · 0.50mm/px · 6 of 22 slices shown]
[im 1/22]
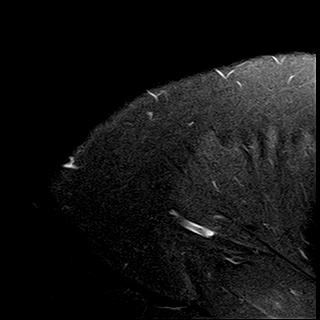
[im 5/22]
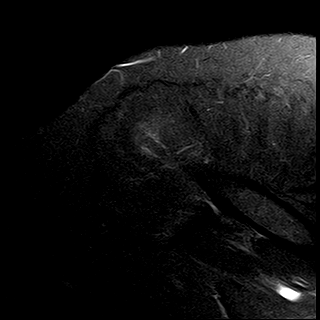
[im 9/22]
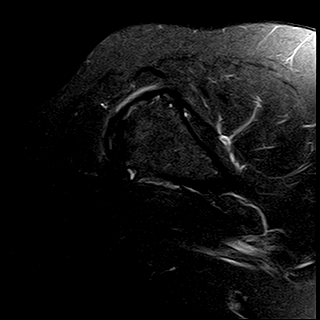
[im 13/22]
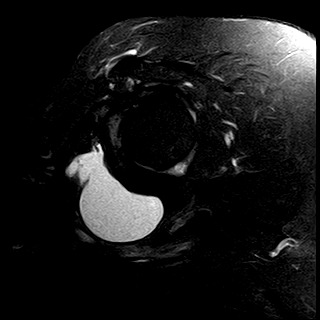
[im 17/22]
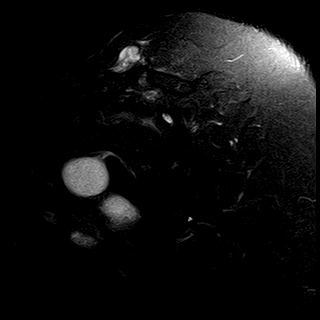
[im 22/22]
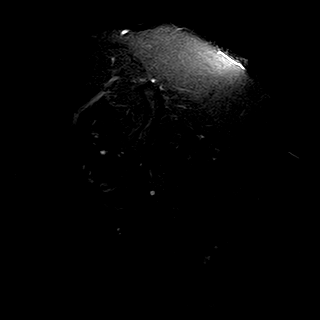

[Series 8: PD fat-sat · axial · left · 4.0mm · 0.50mm/px · z∈[-82,+10]mm · 7 of 24 slices shown]
[im 1/24]
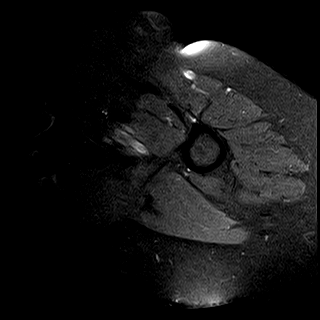
[im 4/24]
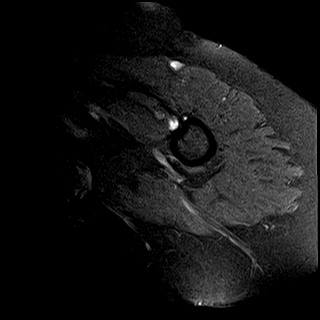
[im 8/24]
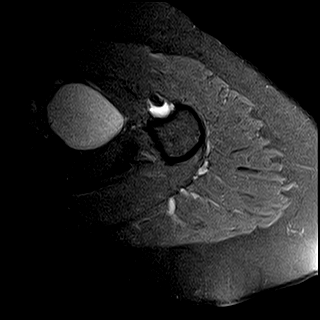
[im 12/24]
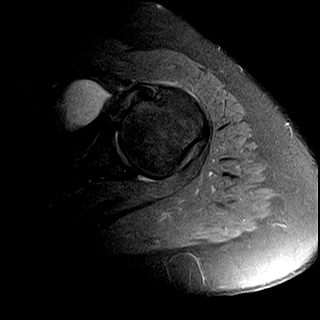
[im 16/24]
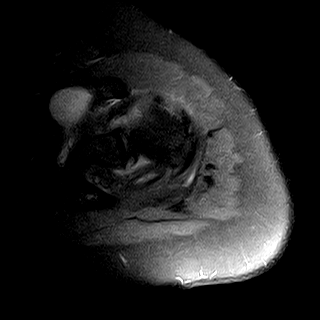
[im 20/24]
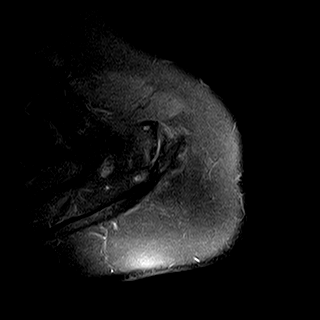
[im 24/24]
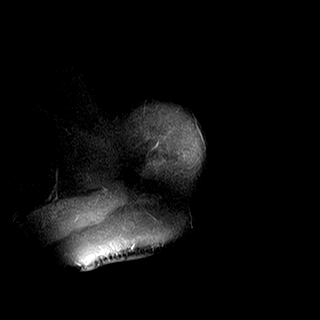

[Series 9: T2 fat-sat · axial · left · 4.0mm · 0.42mm/px · z∈[-82,+10]mm · 7 of 24 slices shown]
[im 1/24]
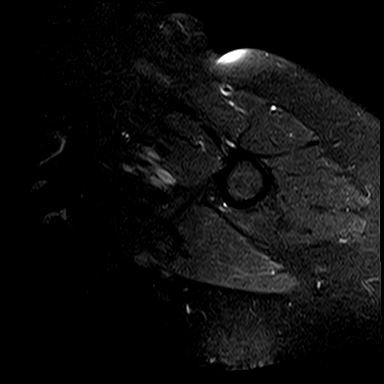
[im 4/24]
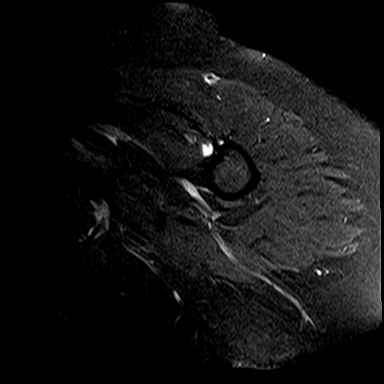
[im 8/24]
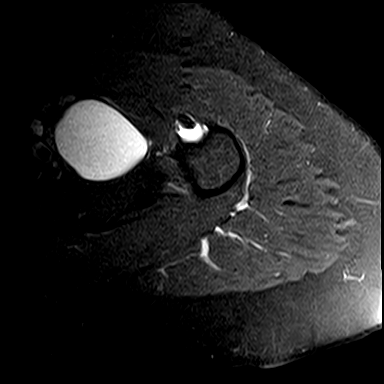
[im 12/24]
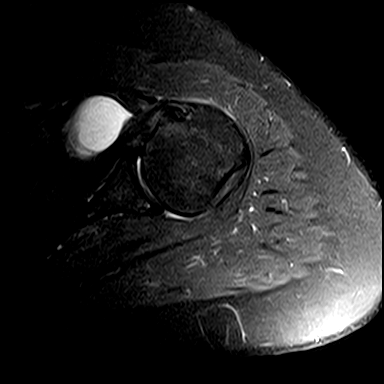
[im 16/24]
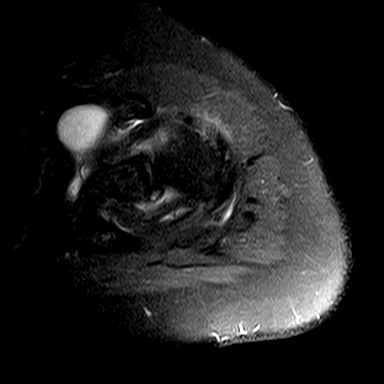
[im 20/24]
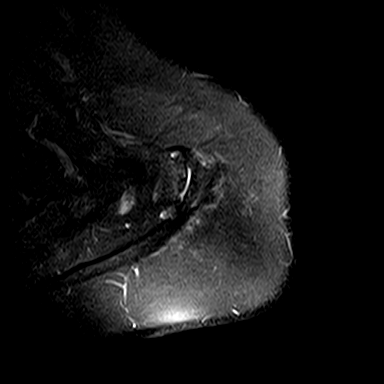
[im 24/24]
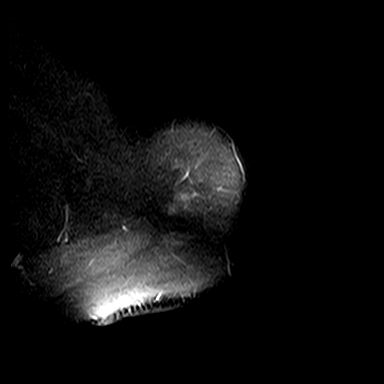

[Series 12: PD · oblique · left · 4.0mm · 0.47mm/px · 7 of 23 slices shown]
[im 1/23]
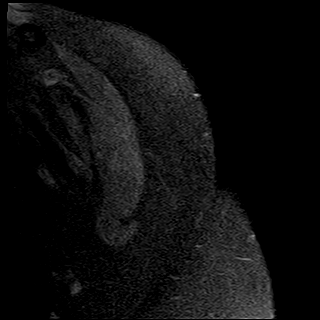
[im 4/23]
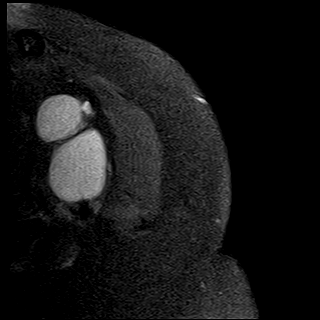
[im 8/23]
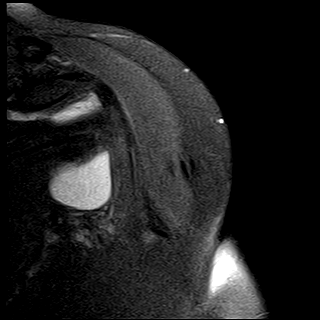
[im 12/23]
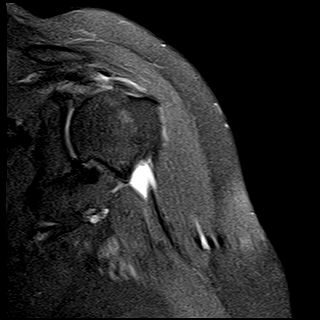
[im 15/23]
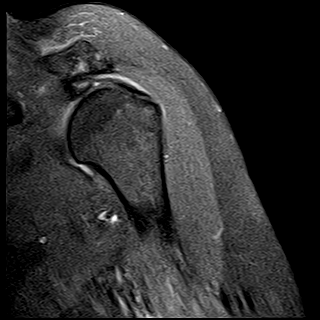
[im 19/23]
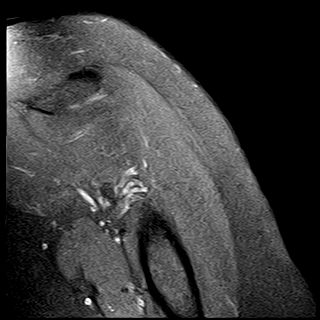
[im 23/23]
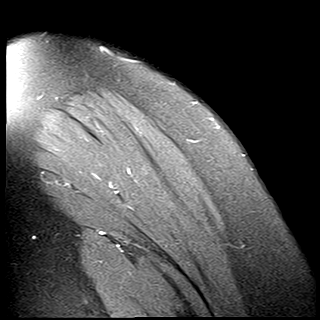

[Series 13: cor (id) · oblique · left · 4.0mm · 0.47mm/px · 6 of 23 slices shown]
[im 1/23]
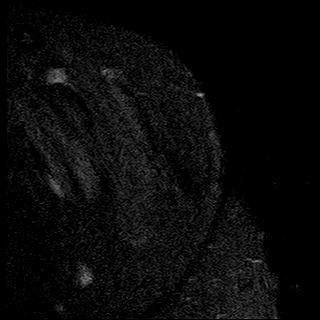
[im 4/23]
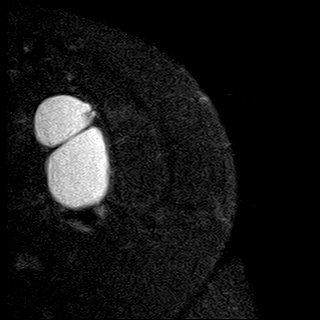
[im 8/23]
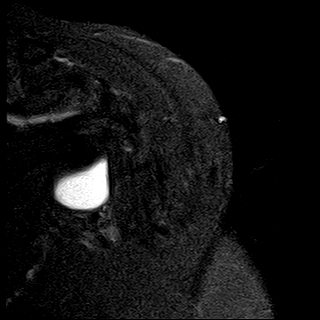
[im 12/23]
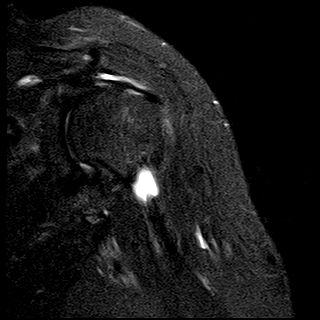
[im 15/23]
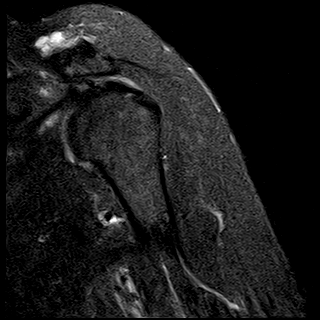
[im 19/23]
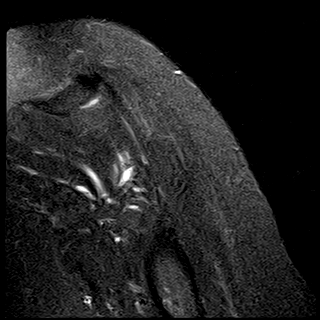

[39 of 40 positions shown; findings below may reference images not displayed]

FINDINGS: RIGHT SHOULDER:  There is a full thickness tear of the supraspinatus tendon with slight retraction and laxity of the supraspinatus tendon.  

There is a partial tear of the infraspinatus tendon.  There is also a partial tear of the subscapularis tendon.  

Moderate degenerative changes are noted.  There is a small joint effusion with fluid in the subacromial-subdeltoid bursa.  There are moderate degenerative changes of the acromioclavicular joint with impingement on the supraspinatus musculotendinous junction.  No fracture or dislocation is seen. 

LEFT SHOULDER:  There is a large amount of fluid distending the subscapularis bursa raising the possibility of bursitis.  

There is a partial tear of the supraspinatus tendon.  The infraspinatus tendon appears intact.  There is a small amount of fluid in the subacromial-subdeltoid bursa.  

No fracture or dislocation is seen.
IMPRESSION: 1. Rotator cuff tear of the right shoulder.

2. Rotator cuff tear of the left shoulder.

3. Large amount of fluid in the subscapularis bursa of the left shoulder raising the possibility of bursitis.

## 2022-06-29 IMAGING — MR MRI THORACIC SPINE WITHOUT CONTRAST
4 of 5 series · 26 of 48 positions shown · IV contrast (gadolinium)
Comparison: No prior imaging studies of thoracic spine are available for comparison.

﻿EXAM:  [id number]   MRI THORACIC SPINE WITHOUT CONTRAST
INDICATION: 66-year-old female with chronic mid back pain. No history of trauma, malignancy or back surgery.
TECHNIQUE: Multiplanar, multisequential MRI of the thoracic spine was performed without gadolinium contrast.

[Series 5: T2 · sagittal · 4.0mm · 0.78mm/px · 8 of 13 slices shown (1 of 2)]
[im 1/13]
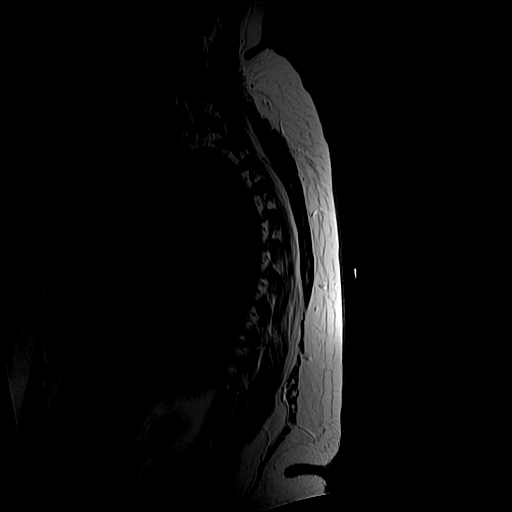
[im 2/13]
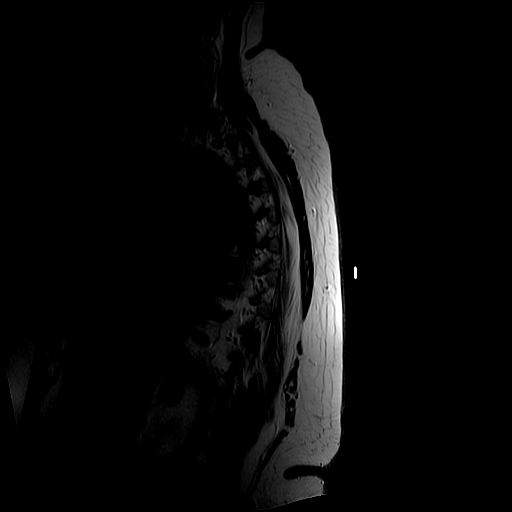
[im 5/13]
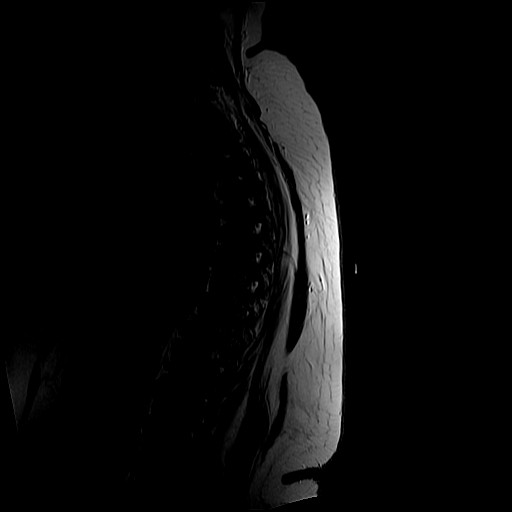
[im 6/13]
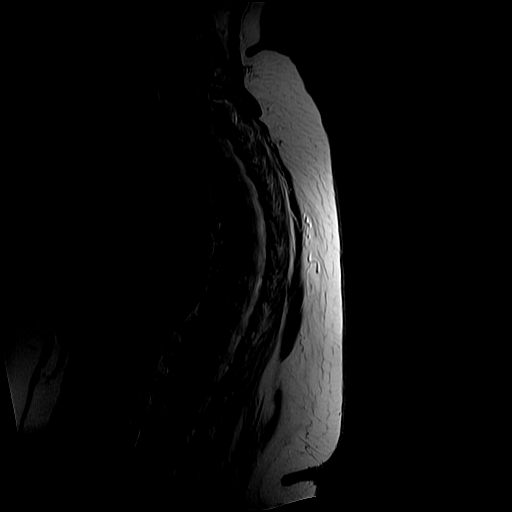
[im 7/13]
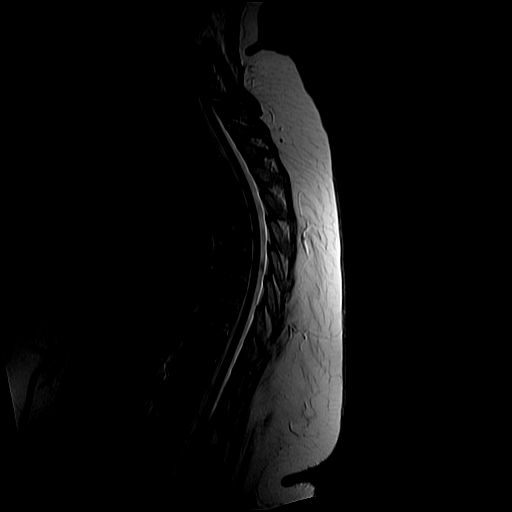
[im 9/13]
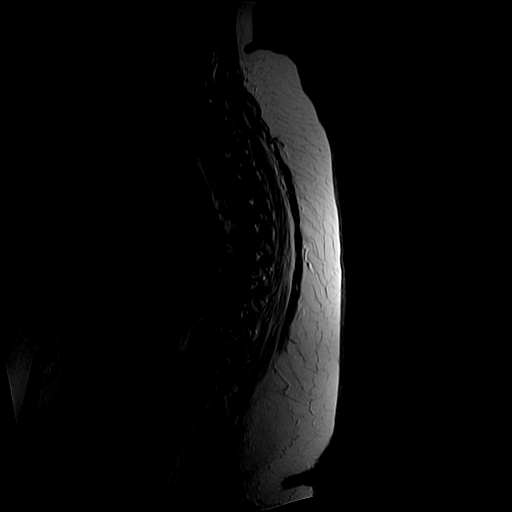
[im 11/13]
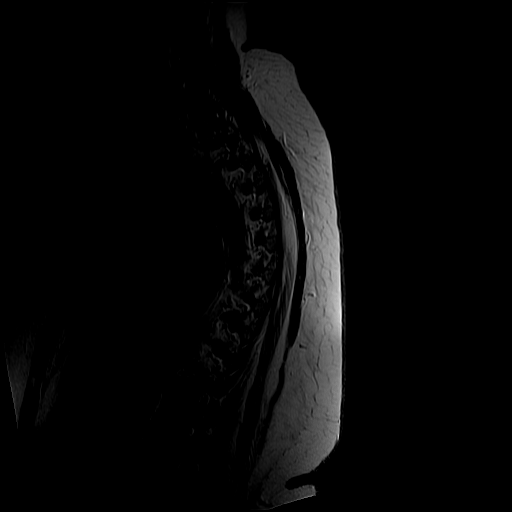
[im 13/13]
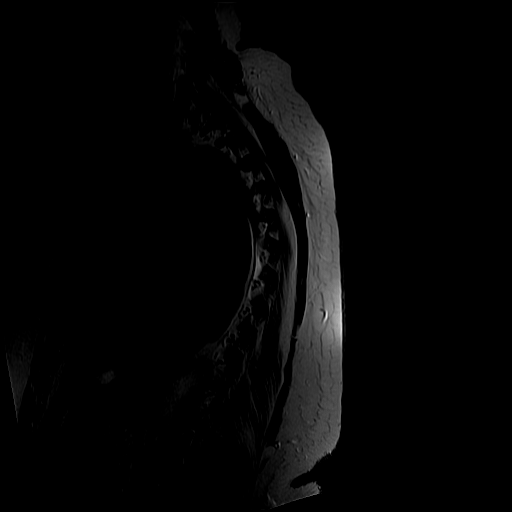

[Series 6: T1 · sagittal · 4.0mm · 0.78mm/px · 6 of 13 slices shown (1 of 2)]
[im 1/13]
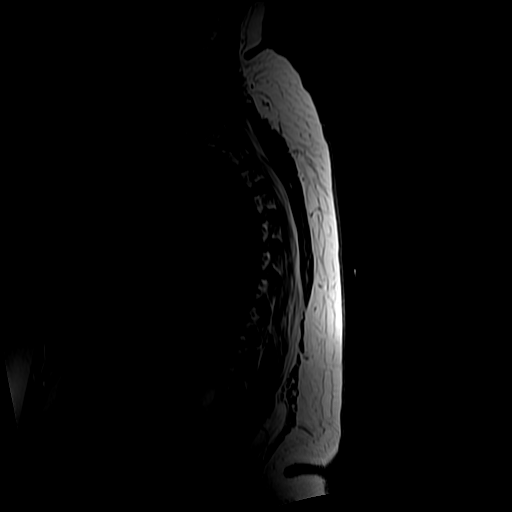
[im 2/13]
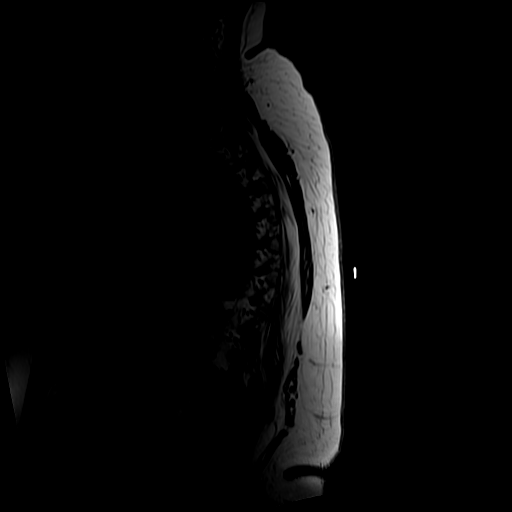
[im 5/13]
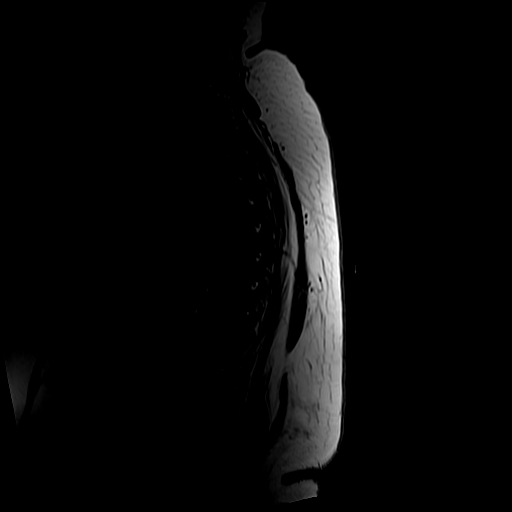
[im 6/13]
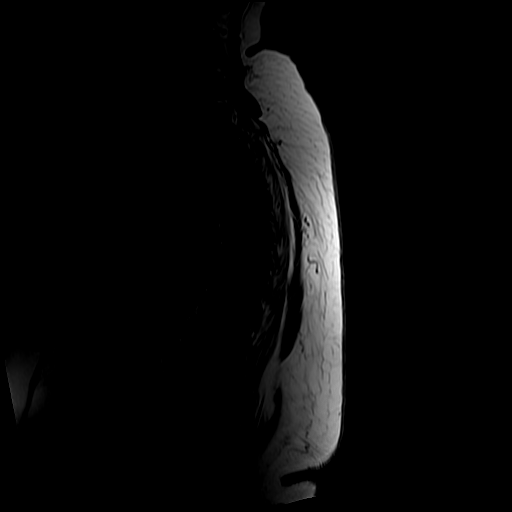
[im 7/13]
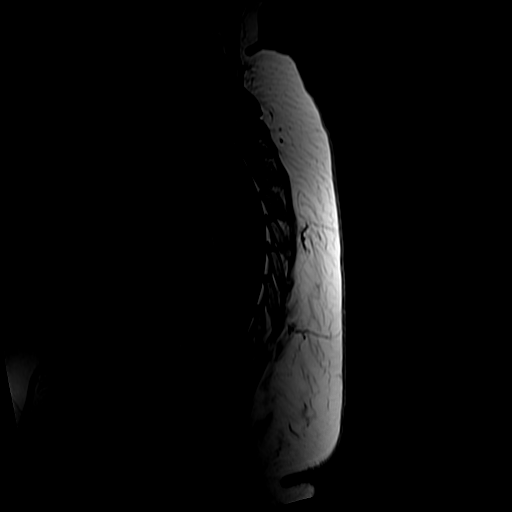
[im 11/13]
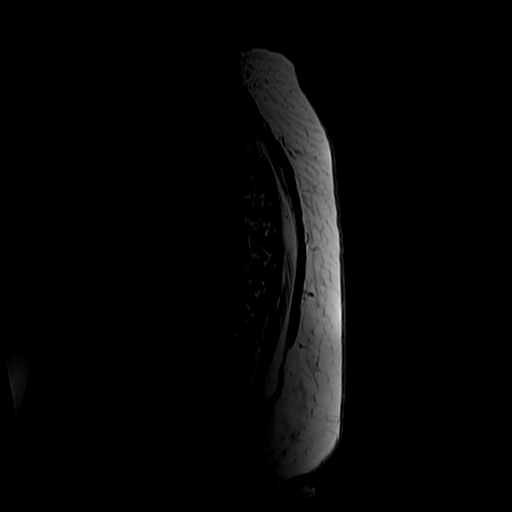

[Series 8: T2 · axial · 4.0mm · 0.62mm/px · z∈[-217,-55]mm · 9 of 12 slices shown (2 of 2)]
[im 1/12]
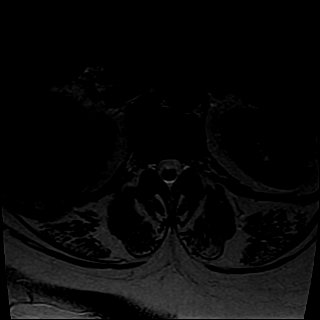
[im 2/12]
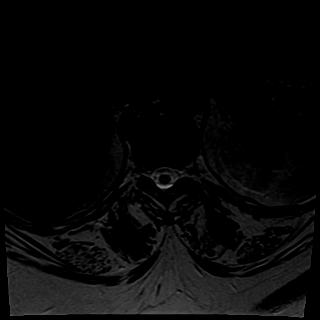
[im 3/12]
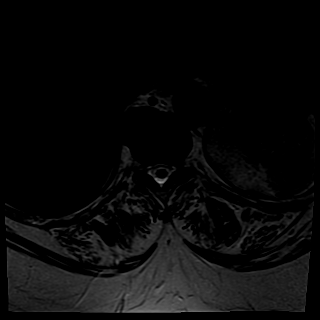
[im 5/12]
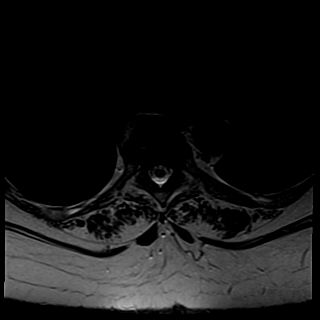
[im 6/12]
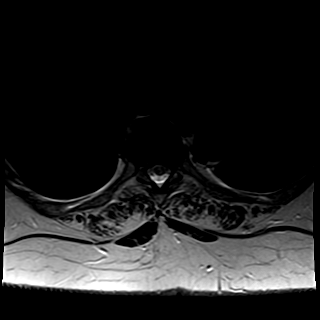
[im 7/12]
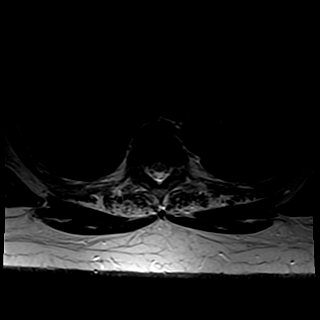
[im 9/12]
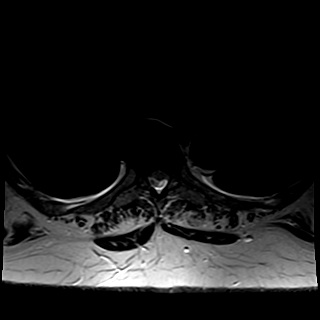
[im 10/12]
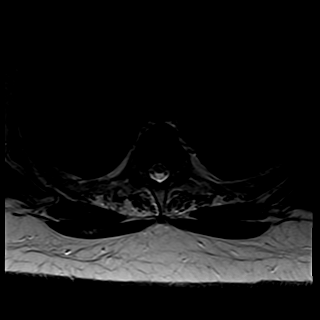
[im 12/12]
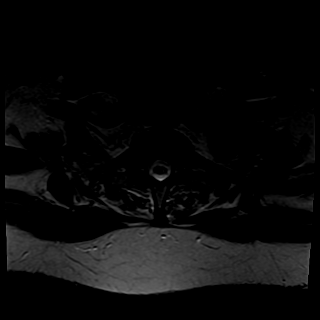

[Series 9: T1 · axial · 4.0mm · 0.62mm/px · z∈[-188,-88]mm · 3 of 12 slices shown (2 of 2)]
[im 2/12]
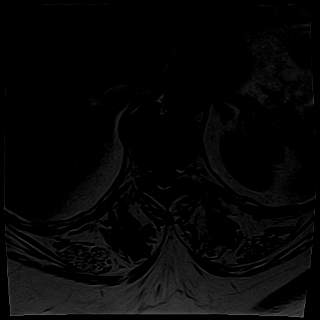
[im 6/12]
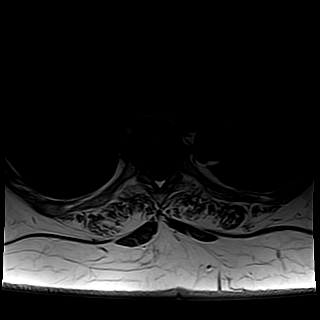
[im 10/12]
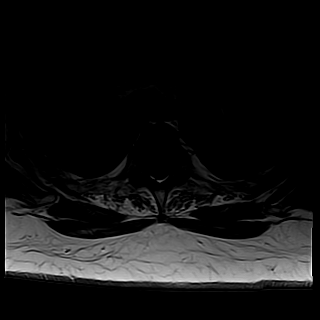

[26 of 48 positions shown; findings below may reference images not displayed]

FINDINGS: No acute bony lesions of thoracic vertebrae are seen.  Moderate degenerative disc changes of mid and lower thoracic discs are noted.  There is no compromise of thecal sac or neural foramina.  Anterior osteophyte formation is noted in the lower thoracic region.

Thoracic spinal cord shows no focal abnormalities.  Paravertebral soft tissues are unremarkable.
IMPRESSION: 1. No acute or focal bone changes of thoracic vertebrae. 

2. Moderate multilevel degenerative disc changes in the mid and lower thoracic discs with no compromise of thecal sac or neural foramina.

3. No focal lesions of thoracic spinal cord.

## 2022-07-23 ENCOUNTER — Ambulatory Visit (INDEPENDENT_AMBULATORY_CARE_PROVIDER_SITE_OTHER): Payer: Medicare Other | Admitting: OTOLARYNGOLOGY

## 2022-07-23 ENCOUNTER — Other Ambulatory Visit: Payer: Self-pay

## 2022-07-23 ENCOUNTER — Encounter (INDEPENDENT_AMBULATORY_CARE_PROVIDER_SITE_OTHER): Payer: Self-pay | Admitting: OTOLARYNGOLOGY

## 2022-07-23 VITALS — Ht 64.0 in | Wt 240.0 lb

## 2022-07-23 DIAGNOSIS — H9313 Tinnitus, bilateral: Secondary | ICD-10-CM

## 2022-07-23 DIAGNOSIS — H6993 Unspecified Eustachian tube disorder, bilateral: Secondary | ICD-10-CM

## 2022-07-23 DIAGNOSIS — J309 Allergic rhinitis, unspecified: Secondary | ICD-10-CM

## 2022-07-23 MED ORDER — AZELASTINE 137 MCG (0.1 %) NASAL SPRAY
2.0000 | Freq: Every evening | NASAL | 11 refills | Status: DC
Start: 2022-07-23 — End: 2023-09-23

## 2022-07-23 NOTE — H&P (Signed)
[facility] Medicine  ENT, [facility]    Progress Note    Name: Dawn Riley MRN:  [id number]   Date: 07/23/2022 Age: 66 y.o.          Follow Up      Subjective:   Chief Complaint:   Follow Up 3 Months (3 month rc on ears and allergies. States having nasal congestion even with using flonase. States not taking the Claritin. )       History of Present Illness:  Dawn Riley is a 66 y.o. old female who presents to the clinic for follow-up. Patient states that she has been having nasal congestion and drainage.  She is using Flonase but not taking Claritin.      Review of Systems     Physical Exam:     Vitals:    07/23/22 1037   Weight: 109 kg (240 lb)   Height: 1.626 m (5\' 4" )   BMI: 41.28      ENT Physical Exam  Constitutional  Appearance: patient appears well-developed, well-nourished and well-groomed,  Communication/Voice: communication appropriate for developmental age; vocal quality normal;  Head and Face  Appearance: head appears normal, face appears normal and face appears atraumatic;  Palpation: facial palpation normal;  Salivary: glands normal;  Ear  Hearing: intact;  Auricles: right auricle normal; left auricle normal;  External Mastoids: right external mastoid normal; left external mastoid normal;  Ear Canals: right ear canal normal; left ear canal normal;  Tympanic Membranes: right tympanic membrane normal; left tympanic membrane normal;  Nose  External Nose: nares patent bilaterally; external nose normal;  Internal Nose: bilateral intranasal mucosa edematous; nasal septal deviation present; bilateral inferior turbinates with hypertrophy;  Oral Cavity/Oropharynx  Lips: normal;  Teeth: normal;  Gums: gingiva normal;  Tongue: normal;  Oral mucosa: normal;  Hard palate: normal;  Neck  Neck: neck normal; neck palpation normal;  Thyroid: thyroid normal;  Respiratory  Inspection: breathing unlabored; normal breathing rate;  Lymphatic  Palpation: lymph nodes normal;  Neurovestibular  Mental Status: alert and  oriented;  Psychiatric: mood normal; affect is appropriate;  Cranial Nerves: cranial nerves intact;       Assessment and Plan:       ICD-10-CM    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 ETD (Eustachian tube dysfunction), bilateral  H69.93       3. Chronic allergic rhinitis  J30.9 31231 - NASAL ENDOSCOPY DIAGNOSTIC UNILATERAL OR BILATERAL (AMB ONLY)        Orders Placed This Encounter    - NASAL ENDOSCOPY DIAGNOSTIC UNILATERAL OR BILATERAL (AMB ONLY)    azelastine (ASTELIN) 137 mcg (0.1 %) Nasal Aerosol, Spray   Continue Flonase and will start astelin.    Medical records reviewed.      Follow up:  Return in about 3 months (around 10/23/2022).    10/25/2022, DO

## 2022-07-23 NOTE — Procedures (Signed)
ENT, [facility]  [address]  [place] [address]    Procedure Note    Name: Dawn Riley MRN:  [id number]   Date: 07/23/2022 Age: 66 y.o.  DOB:   [birth year]       31231 - NASAL ENDOSCOPY DIAGNOSTIC UNILATERAL OR BILATERAL (AMB ONLY)    Performed by: Conchita Paris, DO  Authorized by: Conchita Paris, DO    Time Out:     Immediately before the procedure, a time out was called:  Yes    Patient verified:  Yes    Procedure Verified:  Yes    Site Verified:  Yes  Documentation:      Indications for procedure: Obstructive nasal breathing    Anesthesia: Oxymetazoline nasal spray    Description: Nasal endoscopy with rigid scope was performed with examination of the  septum, inferior, middle, and superior meatus, turbinates, sphenoethmoidal recess, and nasopharynx.     There were no polyps, pus, or granulation tissue noted.  ET orifices and nasopharynx were normal.     Findings: Septal deviation, allergic changes    The patient tolerated the procedure well.               Conchita Paris, DO

## 2022-08-17 IMAGING — US US ABDOMEN COMPLETE
1 series · 14 of 25 positions shown · non-contrast
Comparison: None available.

﻿EXAM: US ABDOMEN COMPLETE
INDICATION: Right upper quadrant abdominal pain.

[Series 1: us abdomen complete · 14 of 66 slices shown]
[im 1/66]
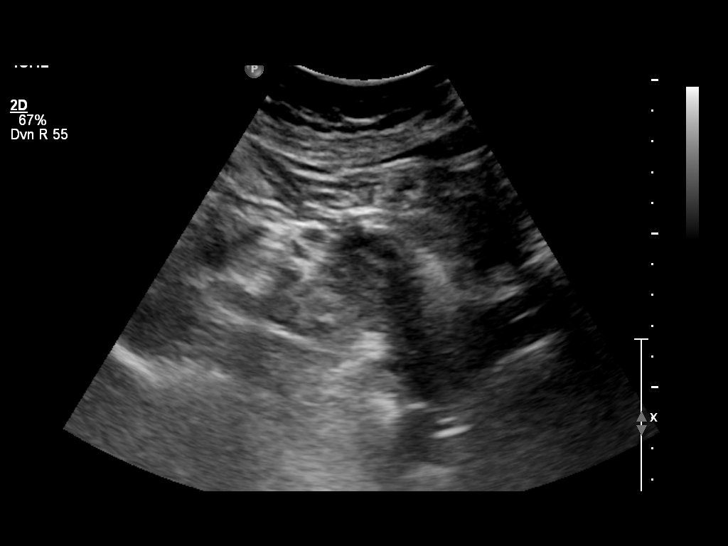
[im 6/66]
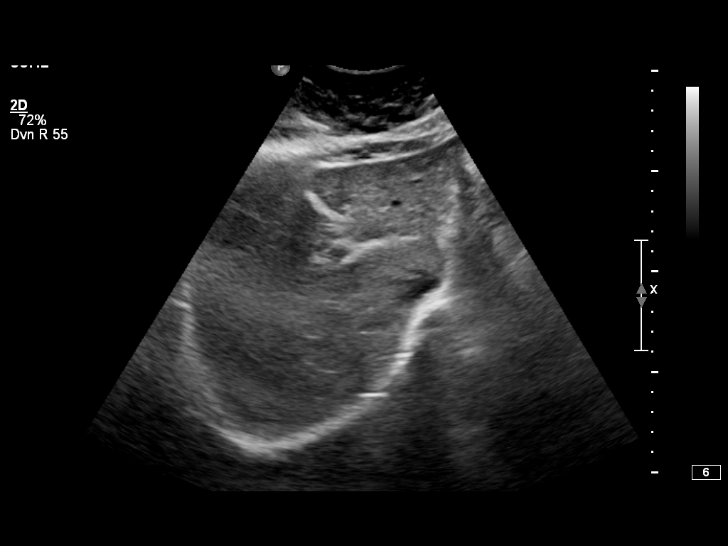
[im 11/66]
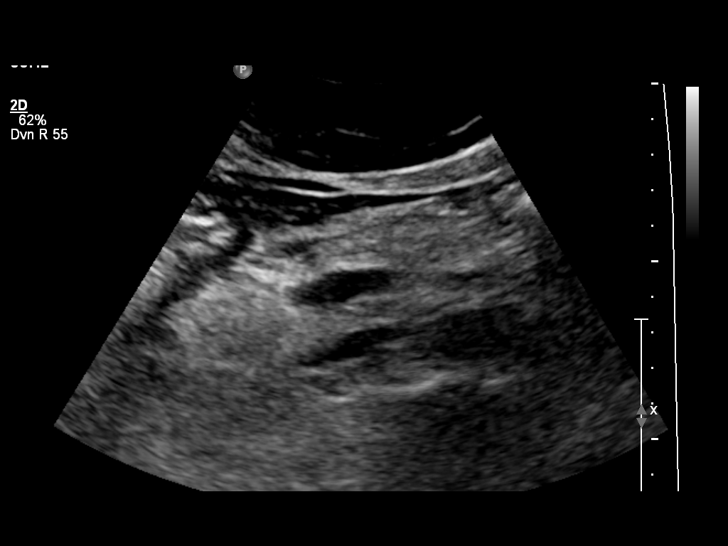
[im 17/66]
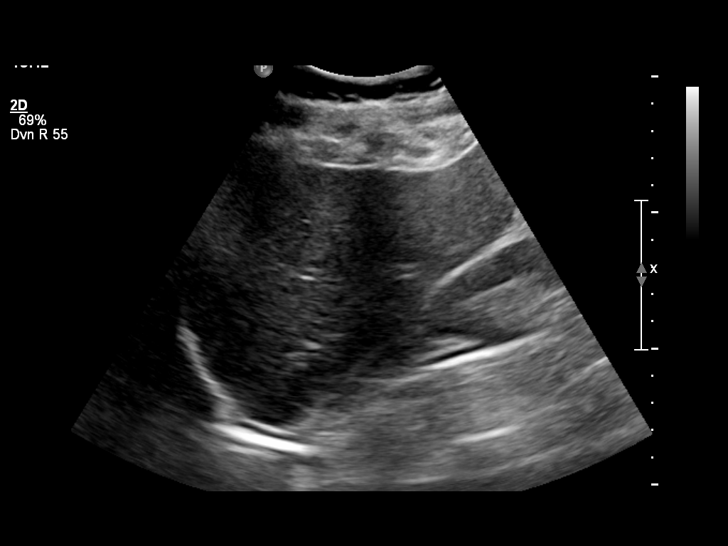
[im 22/66]
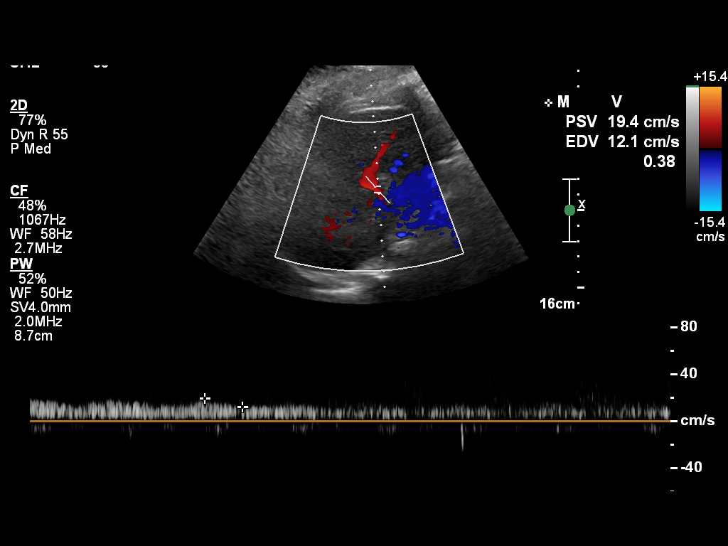
[im 25/66]
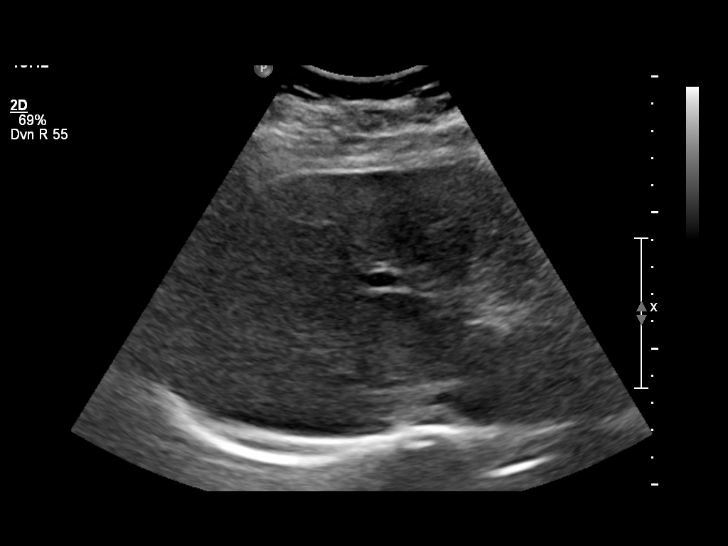
[im 30/66]
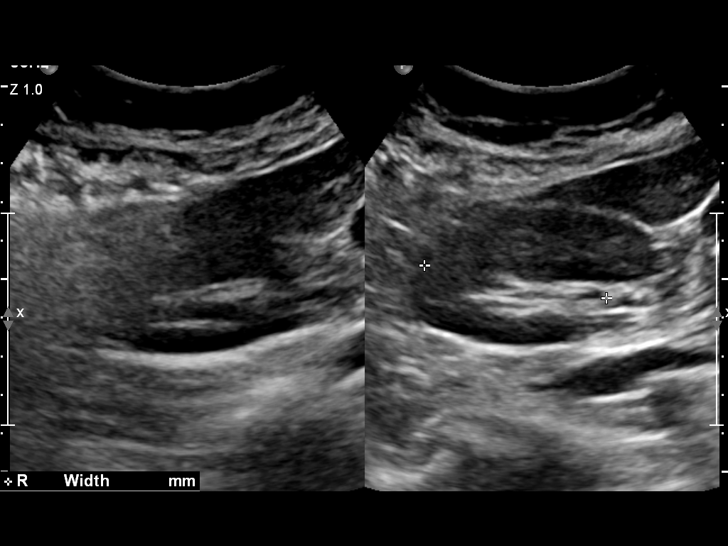
[im 36/66]
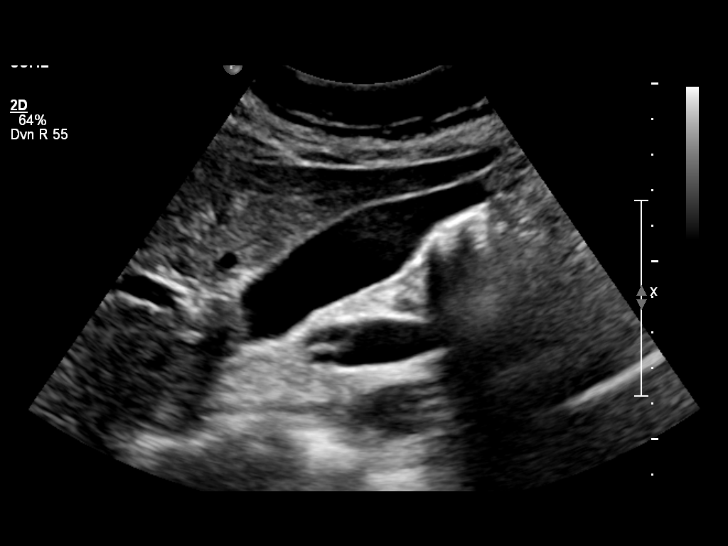
[im 41/66]
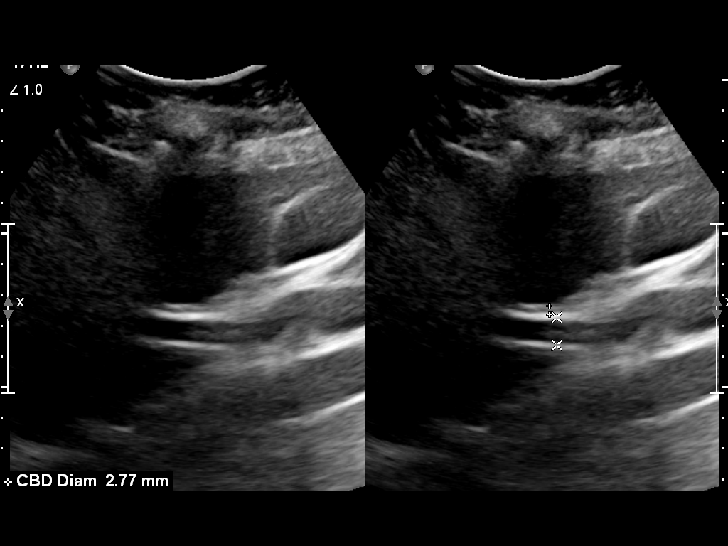
[im 44/66]
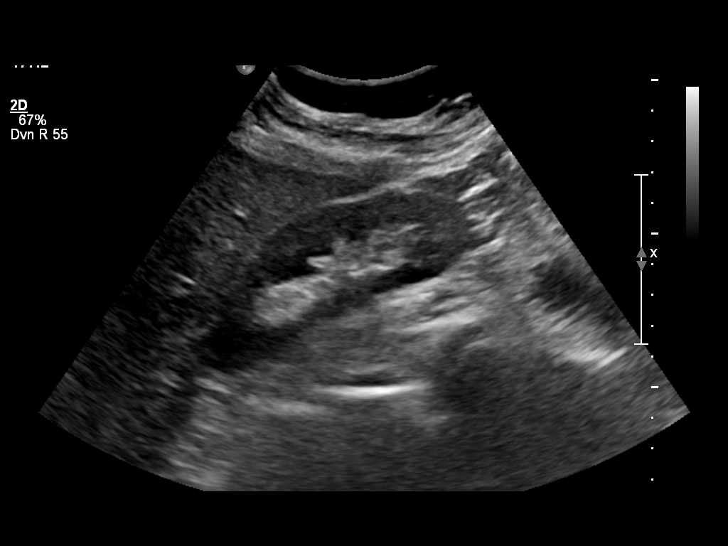
[im 49/66]
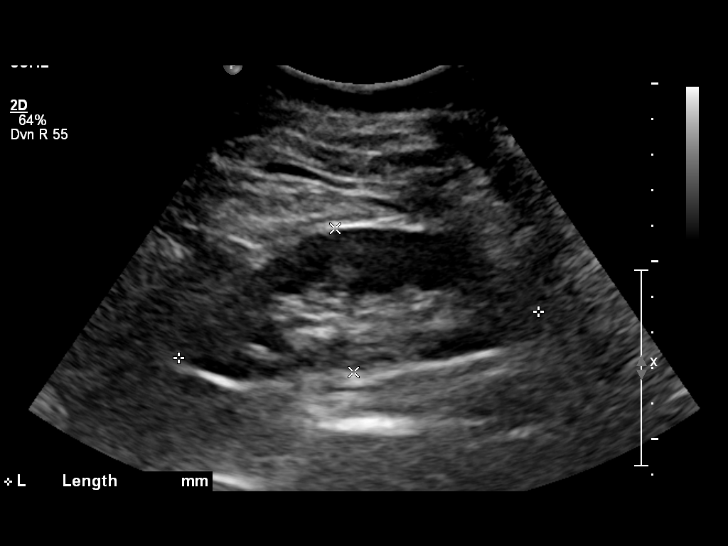
[im 55/66]
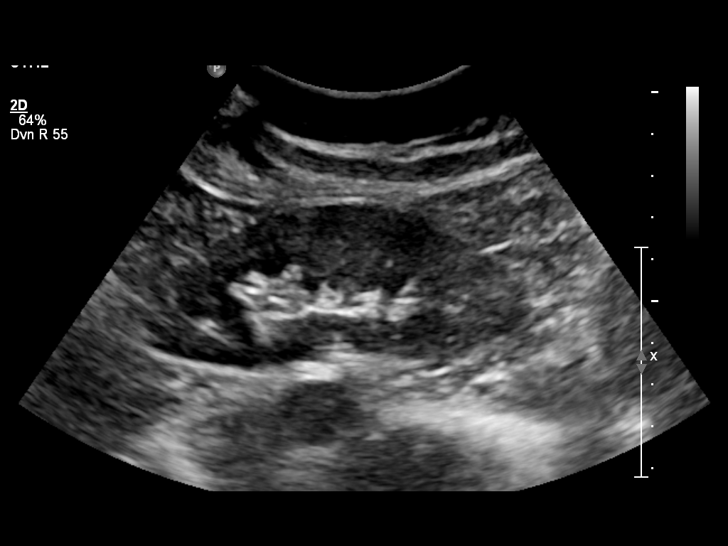
[im 60/66]
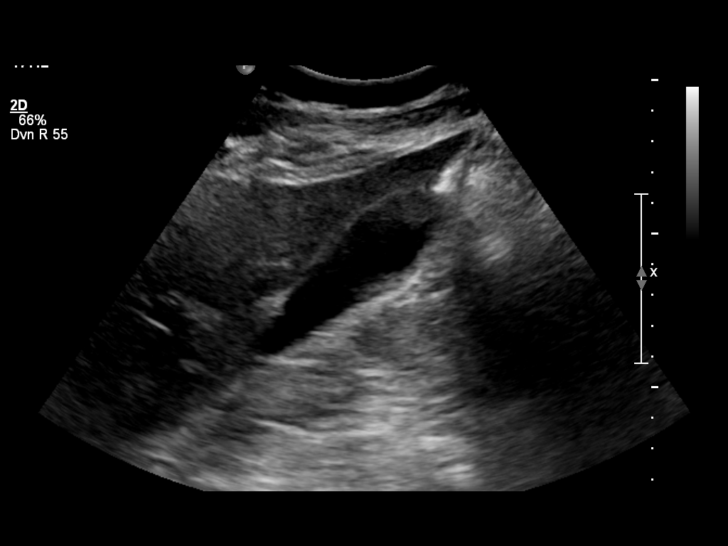
[im 66/66]
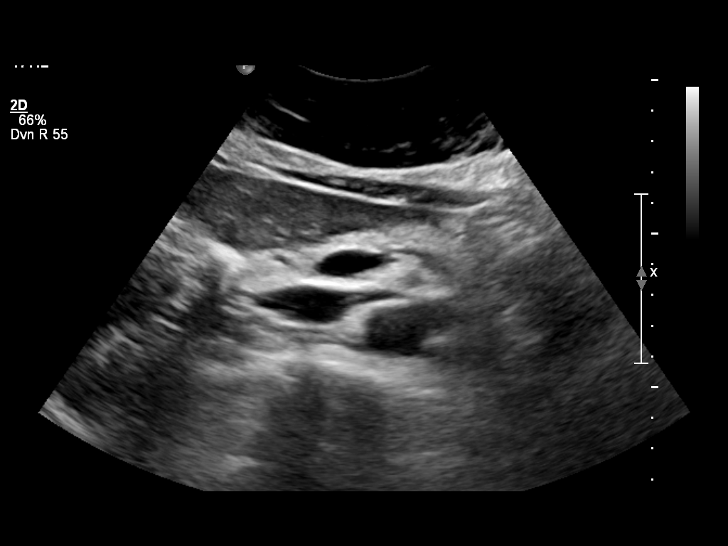

[14 of 25 positions shown; findings below may reference images not displayed]

FINDINGS: Gallbladder shows no stones or edema.  Common hepatic duct measures 3 mm. 

Normal size liver and spleen measuring 14 cm and 10 cm in length. Portal vein and hepatic veins are patent. 

Aorta, vena cava and kidneys are normal.  Pancreas is incompletely visualized due to bowel gas.  No free fluid.
IMPRESSION: 1. Normal gallbladder and extrahepatic bile ducts.  

2. Normal size liver and spleen without focal lesions.  No free fluid.

3. Pancreas incompletely visualized due to artifact from overlying bowel gas.

## 2022-10-22 ENCOUNTER — Encounter (INDEPENDENT_AMBULATORY_CARE_PROVIDER_SITE_OTHER): Payer: Self-pay | Admitting: OTOLARYNGOLOGY

## 2022-10-22 ENCOUNTER — Ambulatory Visit (HOSPITAL_COMMUNITY)
Admission: RE | Admit: 2022-10-22 | Discharge: 2022-10-22 | Disposition: A | Discharge status: Still In Hospital | Payer: Medicare Other | Source: Ambulatory Visit

## 2022-10-22 ENCOUNTER — Other Ambulatory Visit: Payer: Self-pay

## 2022-10-22 DIAGNOSIS — M546 Pain in thoracic spine: Secondary | ICD-10-CM | POA: Insufficient documentation

## 2022-10-22 NOTE — PT Evaluation (Incomplete)
[facility]  Outpatient Physical Therapy  [place], [address]  [phone]  [phone]       Physical Therapy Upper Extremity Evaluation    Date: 10/22/2022  Patient's Name: Dawn Riley  Date of Birth: 1955/09/23    PT diagnosis/Reason for Referral: Bilateral shoulder and thoracic pain  Diagnosis: Other spondylosis of thoracic region and DDD  MedBridge Access Code: [email or web address]             SUBJECTIVE  Subjective: Patient reports onset of mid back pain and B shoulder pain 1-2 years ago with no MOI. Patient notes significant disruption to sleep quality, ability to complete household chores as well as occupational demands. Pt reports that previous imaging showed small tears in B rotator cuffs (chronic in nature now). For this reason she has not attempted any of the exercises previously given to her as she wasn't sure if they would be bad to do with these.    Previous episodes/treatments: Previously had PT which was beneficial to the patient.     Medications for this problem:  NSAIDs providing 50-60% relief lasting 4-5 hours.     Diagnostic tests: MRI and X-ray (PT doesn't have access to)   - Per doctor note from 10/17/22 : Facet changes are most notable in the upper levels of T2-T6 Bilaterally    Patient goals: REDUCE PAIN, RETURN TO WORK, and NORMALIZE FUNCTION    Occupation:  Works as a host at [employer]      Next MD visit: Unknown    Pain location: B shoulders and mid spine                    Pain description: ACHING and BURNING    Pain frequency:  CONTINUOUS and Various intensities    Pain rating: Now 5/10   Best 5/10   Worst 7/10    Radiculopathy: Radiates to B sides of her ribs and doesn't exit the axial spine.     Pain increases with:  Laying down           decreases with : POSITION CHANGE and REST    Sleep affected: Yes disrupts sleep    Subjective Functional Reports:    Sitting: WFL with back support    Standing: LIMITED    Walking:  LIMITED    Lifting: LIMITED        Patient-Specific Functional Score:  Problem 10/23/22   1. Return to work restriction free 2   2. Return to household work restriction free (vacuuming/cleaning) 0   TOTAL 1     OBJECTIVE    Shoulder AROM   right left   Flexion 90 152   Extension 32 32   Abduction 92 115   ER AC joint C5   IR L3 L1   **More pain with R UE movements however reports pain at end range of B shoulder range.    PROM to B shoulders: WFL however pt does wince at end range       Strength    right left   Shoulder flexion 3- 3   Shoulder abduction  3- 3   Shoulder IR 3- 3   Shoulder ER 3- 3     Joint mobility:   Shoulder: none noted  Thoracic spine: Unable to test as pt preferred not to lay prone    Palpation: Increased tone and tenderness upon palpation to B pec, supra/infraspinatus, levator (B)  Posture: Rounded shoulders with increased thoracic kyphosis    - Pt anatomy with larger bust plays a role in poor postural tendencies in addition to weakness in postural muscles.    Shoulder special tests:    NEER'S SIGN: NEGATIVE LEFT and NEGATIVE RIGHT    KENNEDY HAWKINS TEST: NEGATIVE LEFT and NEGATIVE RIGHT    SPEEDS TEST: NEGATIVE LEFT and NEGATIVE RIGHT    O'BRIEN'S TEST: NEGATIVE LEFT and NEGATIVE RIGHT       Cervical screening:Negative     Thoracic screen:    1.  AROM of thoracic region + with facet loading   2. + compression test with rotation and extension  - Pain with active thoracic rotation in sitting - added a deep breath at end range with active movement into new range with improved comfort. (Decreased gapping noted)    Treatment provided:REVIEW OF POC AND GOALS WITH PATIENT, ALL QUESTIONS ANSWERED, PATIENT EDUCATION, THERAPEUTIC EXERCISE , and postural education  Access Code: [email or web address]  URL: [email or web address]  Date: 10/23/2022  Prepared by: Jonell Cluck    Exercises  - Doorway Pec Stretch at 90 Degrees Abduction  - 1-2 x daily - 7 x weekly - 3 reps - 30 hold  - Doorway Pec Stretch at 60  Elevation  - 1-2 x daily - 7 x weekly - 3 sets - 30 hold  - Supine Shoulder Flexion Extension AAROM with Dowel  - 1-2 x daily - 7 x weekly - 10-20 reps  - Supine Shoulder Abduction AAROM with Dowel  - 1-2 x daily - 7 x weekly - 15-20 reps  - Seated thoracic rotation (arms crossed) with deep breath at [facility] and active movement into new range     **Pt was unable to tolerate pec stretching at higher ROM as she reported altered sensations down B forearms.          ASSESSMENT    Impression: Patient is a 67 y/o referred to PT services for thoracic spondylosis and B shoulder pain with no MOI. Per subjective reporting pt has pain that radiates down B rib cages but stays in axial skeleton. PT examination notes greater deficits in R shoulder AROM in comparison to L with full PROM to both in supine. Pt notes pain with facet loading with rotation and extension in sitting and relief when coupled with gentle mobility. Pt presents with poor postural tendencies, high tone and decrease tissue pliability/flexibility in B pectoralis muscles. Patient was negative on shoulder special tests however did exhibit significant weakness in postural and bilateral shoulder musculature. Patient to benefit from PT services 2x/wk for 8 weeks to improve ROM of B shoulders, thoracic spine, improve postural and B shoudler strength to allow pt to return to occupational and household chores restriction free.    Rehab potential: FAIR      Short term goals (3 weeks):   1. Patient to decrease pain to <5/10 at worst in B shoulders and thoracic spine to aid in improved sleep quality.    2. Patient to improve B shoulder flexion/abduction AROM to 155 degrees not limited by pain   3. Patient to improve standing/walking tolerance to >20 minutes without limitation from mid back pain   4. Patient to be independent and complaint with HEP and progressions.      Long term goals (6 weeks):   1. Patient to improve Patient Specific Functional Score to 8/10 to allow  return to PLOF without restriction   2. Patient to improve B shoulder strength to 4-  to 4/5 to aid in completion of household tasks such as vacuuming without limitation    3. Patient to improve thoracic spine mobility without radicular symptoms to improve occupational demands.             PLAN  Patient will attend 2 times per week x 6 weeks. Therapy may include, but is not limited to THERAPEUTIC EXERCISES, MYOFASCIAL/JOINT MOBILIZATION, POSTURE/BODY MECHANICS, ERGONOMIC TRAINING, HOME INSTRUCTIONS, HEAT/COLD, ELECTRICAL STIMULATION, and KINESIOTAPE    Plan for next visit Review HEP and progress as able.       Evaluation complexity:   Personal factors impacting POC: PRE-EXISTING FUNCTIONAL LIMITATIONS   Co-morbidities impacting POC: OBESITY  Complexity of physical exam: INCLUDING MUSCULOSKELETAL SYSTEM (POSTURE, ROM, STRENGTH, HEIGHT/WEIGHT), INCLUDING INTEGUMENTARY EXAM (TISSUE PLIABILITY, SCAR TISSUE, SKIN COLOR), and REFERRAL IS FOR A CHRONIC PROBLEM   Clinical Presentation: STABLE   Evaluation Complexity: LOW-HISTORY 0, EXAMINATION 1-2, STABLE PRESENTATION        Total Session Time 43, Timed code minutes 10, and Untimed code minutes 33        Intervention minutes: EVALUATION 33 minutes and THERAPEUTIC EXERCISE 10 minutes    Jonell Cluck, PT  10/22/2022, 17:28    Start of Service: _________          Certification:    From:______  Through:_________    I certify the need for these services furnished under this plan of treatment and while under my care.    Referring Provider Signature: _______________     Date : _____________________    Printed Name of Referring Provider: __________________________________________

## 2022-10-24 ENCOUNTER — Other Ambulatory Visit (HOSPITAL_COMMUNITY): Payer: Self-pay

## 2022-10-24 DIAGNOSIS — M546 Pain in thoracic spine: Secondary | ICD-10-CM

## 2022-10-25 ENCOUNTER — Ambulatory Visit
Admission: RE | Admit: 2022-10-25 | Discharge: 2022-10-25 | Disposition: A | Discharge status: Still In Hospital | Payer: Medicare Other | Source: Ambulatory Visit

## 2022-10-25 ENCOUNTER — Other Ambulatory Visit: Payer: Self-pay

## 2022-10-25 NOTE — PT Treatment (Signed)
[facility]  Outpatient Physical Therapy  [place], [address]  [phone]  [phone]    Physical Therapy Treatment Note    Date: 10/25/2022  Patient's Name: Dawn Riley  Date of Birth: 06-01-1956            Visit #/POC:2 of 12-16   Authorization:med nec   POC Signed?: No  POC Ends: 12/24/22  Next Progress Note Due: 11/20/22      Evaluating Physical Therapist: Jonell Cluck, DPT  PT diagnosis/Reason for Referral: Bil shoulder and thoracic pain  Next Scheduled Physician Appointment: TBA  Allergies/Contraindications: Sulpha drugs, Azelastine          Subjective: Patient states she is doing well with no adverse effects noted. Reports HEP is going well.    Objective: Reviewed HEP with patient. Performed supine wand exercise with small towel roll along thoracic spine to promote thoracic extension. Patient able to tolerate for about 2 minutes. Added additional exercises for thoracic mobility along with education on resting posture via lumbar support while driving and when sitting in chair at home.    Measured ROM: Not assessed today, 10/25/22  EXERCISE/ACTIVITY NAME REPETITIONS RESISTANCE COMPLETED THIS DOS   Hooklying wand flexion with towel roll along thoracic spine    Hooklying wand abduction   10      10  Y      Y    Open books   10 ea  y   Seated extension into PB   2 x 10  y   Standing lumbar/thoracic extension at counter   10  y                                 Handout given 10/25/22    Access Code: [email or web address]  URL: [email or web address]  Date: 10/25/2022  Prepared by: Elinor Parkinson    Exercises  - Seated Thoracic Lumbar Extension  - 1-2 x daily - 7 x weekly - 1 sets - 10 reps  - Sidelying Open Book Thoracic Lumbar Rotation and Extension  - 1-2 x daily - 7 x weekly - 1 sets - 10 reps  - Standing Lumbar Extension  - 1-2 x daily - 7 x weekly - 1 sets - 10 reps      Assessment: Good tol of Rx, noting improvement in thoracic pain and less stiffness after  exercises.    Short term goals (3 weeks):               1. Patient to decrease pain to <5/10 at worst in B shoulders and thoracic spine to aid in improved sleep quality.                2. Patient to improve B shoulder flexion/abduction AROM to 155 degrees not limited by pain               3. Patient to improve standing/walking tolerance to >20 minutes without limitation from mid back pain               4. Patient to be independent and complaint with HEP and progressions.        Long term goals (6 weeks):               1. Patient to improve Patient Specific Functional Score to 8/10 to allow return to PLOF without restriction  2. Patient to improve B shoulder strength to 4- to 4/5 to aid in completion of household tasks such as vacuuming without limitation                3. Patient to improve thoracic spine mobility without radicular symptoms to improve occupational demands.     Plan: Cont with current POC. Work on postural stabilization and strengthening.    Total Session Time 40, Timed code minutes 40, and Untimed code minutes 0  THERAPEUTIC EXERCISE 40 minutes      Elinor Parkinson, PTA  10/25/2022, 10:45

## 2022-10-29 ENCOUNTER — Other Ambulatory Visit: Payer: Self-pay

## 2022-10-29 ENCOUNTER — Ambulatory Visit (HOSPITAL_COMMUNITY)
Admission: RE | Admit: 2022-10-29 | Discharge: 2022-10-29 | Disposition: A | Discharge status: Still In Hospital | Payer: Medicare Other | Source: Ambulatory Visit

## 2022-10-29 NOTE — PT Treatment (Signed)
[facility]  Outpatient Physical Therapy  [place], [address]  [phone]  [phone]    Physical Therapy Treatment Note    Date: 10/29/2022  Patient's Name: Dawn Riley  Date of Birth: 1956/02/19            Visit #/POC: 3 of 12-16   Authorization:med nec   POC Signed?: No  POC Ends: 12/24/22  Next Progress Note Due: 10th visit or 11/19/22      Evaluating Physical Therapist: Jonell Cluck, DPT  PT diagnosis/Reason for Referral: Bil shoulder and thoracic pain  Next Scheduled Physician Appointment: TBA  Allergies/Contraindications: Sulpha drugs, Azelastine  Access Code: [email or web address]        Subjective: Patient reports she experienced muscle spasms in mid to low back in response to HEP last visit. When asked if she knew which exercise could potentially caused muscle spasms she reports the "note book."    Objective:  treatment delivered as noted below.    Measured ROM: Not assessed today, 10/29/22  EXERCISE/ACTIVITY NAME REPETITIONS RESISTANCE COMPLETED THIS DOS   Hooklying wand flexion     Static supine with towel roll along thoracic spine    Hooklying wand abduction    Hooklying horizontal abd with wand   10    X2 min      10    x10  Y    y      Y     y   Open books   x7 ea Modified this day: elbow flexed and at side Y: HEP 10/25/22   Seated extension into PB   x10 No PB used this day; performed in chair with back Y: HEP 10/25/22   Standing lumbar/thoracic extension at counter   10  Y: HEP 10/25/22   Hooklying UE: bilat horizontal abd  --unilateral extension d then diagonal (opposite static at 90 degrees flexion) X10  X10 ea Yellow Tband  Yellow Tband Y  y   Seated thoracic rotation with head neutral  2x5  y   Warehouse manager    N: HEP 10/22/22                   Assessment: Patient requested to not perform supine UE wand exercises while laying on towel roll as she feels this could possibly had irritated her muscles. Had to modify open book exercise to make it more  tolerable, corrections were given as she was rotating entire body. She performed less reps per her request and is noted to have poor rotational mobility present. Reviewed current HEP to ensure understanding and correct technique,  did not progress HEP until she returns and can assess her response to slight strength progression during session.     Short term goals (3 weeks):               1. Patient to decrease pain to <5/10 at worst in B shoulders and thoracic spine to aid in improved sleep quality.                2. Patient to improve B shoulder flexion/abduction AROM to 155 degrees not limited by pain               3. Patient to improve standing/walking tolerance to >20 minutes without limitation from mid back pain               4. Patient to be independent and complaint with HEP and progressions.  Long term goals (6 weeks):               1. Patient to improve Patient Specific Functional Score to 8/10 to allow return to PLOF without restriction               2. Patient to improve B shoulder strength to 4- to 4/5 to aid in completion of household tasks such as vacuuming without limitation                3. Patient to improve thoracic spine mobility without radicular symptoms to improve occupational demands.     Plan:  Assess response to progression of strengthening this visit and additional thoracic mobility exercises performed.     Total Session Time 40, Timed code minutes 35, and Untimed code minutes 0  THERAPEUTIC EXERCISE 35 minutes    Benjie Karvonen, PTA  10/29/2022 10:53

## 2022-10-30 ENCOUNTER — Ambulatory Visit (HOSPITAL_COMMUNITY): Payer: Self-pay

## 2022-11-01 ENCOUNTER — Ambulatory Visit (HOSPITAL_COMMUNITY)
Admission: RE | Admit: 2022-11-01 | Discharge: 2022-11-01 | Disposition: A | Discharge status: Still In Hospital | Payer: Medicare Other | Source: Ambulatory Visit

## 2022-11-01 ENCOUNTER — Other Ambulatory Visit: Payer: Self-pay

## 2022-11-01 NOTE — PT Treatment (Signed)
[facility]  Outpatient Physical Therapy  [place], [address]  [phone]  [phone]    Physical Therapy Treatment Note    Date: 11/01/2022  Patient's Name: Dawn Riley  Date of Birth: 29-Apr-1956            Visit #/POC: 4 of 12-16   Authorization:med nec   POC Signed?: No  POC Ends: 12/24/22  Next Progress Note Due: 10th visit or 11/19/22      Evaluating Physical Therapist: Jonell Cluck, DPT  PT diagnosis/Reason for Referral: Bil shoulder and thoracic pain  Next Scheduled Physician Appointment: TBA  Allergies/Contraindications: Sulpha drugs, Azelastine  Access Code: [email or web address]        Subjective: Patient reports good response to last visit.    Objective:  treatment delivered as noted below.    Patient-Specific Functional Score:  Problem 10/23/22   1. Return to work restriction free 2   2. Return to household work restriction free (vacuuming/cleaning) 0   TOTAL 1      Shoulder AROM    Right        11/01/22 Left        11/01/22   Flexion 90                90 152           105   Extension 32                32 32               32   Abduction 92               110 115            120   ER AC joint     occiput C5              T2   IR L3               T9 L1              T9     EXERCISE/ACTIVITY NAME REPETITIONS RESISTANCE COMPLETED THIS DOS   Hooklying wand flexion     Static supine with towel roll along thoracic spine    Hooklying wand abduction    Hooklying horizontal abd with wand    Hooklying ceiling punch with wand   10    X2 min      10    X10    x10 2#                  2# Y    n      Y     Y    y   Open books   x7 ea Modified this day: elbow flexed and at side n: HEP 10/25/22   Seated extension into PB   x10 No PB used this day; performed in chair with back n: HEP 10/25/22   Standing lumbar/thoracic extension at counter   10  n: HEP 10/25/22   Hooklying UE: bilat horizontal abd  --unilateral extension d then diagonal (opposite static at 90 degrees flexion)  X10  X10 ea Yellow Tband  Yellow Tband Y  y   Seated thoracic rotation with head neutral  2x5  y   Warehouse manager    N: HEP 10/22/22   UBE X5 min L2.0 y   Wall wipes: 3 directions (L, up,  R)  --cirlces: CW/CCW X5 ea  X10, x7 AROM  AROM large circles Y  y       Assessment: Patient presents with bilat shoulder AROM gains in all directions except flexion in which she has no change with R and considerable regression with L. She states pain is the limiting factor with flexion ROM today. No changes with bilat shoulder extension, but her motion is [facility]. Patient reports mid back pain in response to exercises.     Short term goals (3 weeks):               1. Patient to decrease pain to <5/10 at worst in B shoulders and thoracic spine to aid in improved sleep quality.                2. Patient to improve B shoulder flexion/abduction AROM to 155 degrees not limited by pain               3. Patient to improve standing/walking tolerance to >20 minutes without limitation from mid back pain               4. Patient to be independent and complaint with HEP and progressions. (MET 11/01/22)        Long term goals (6 weeks):               1. Patient to improve Patient Specific Functional Score to 8/10 to allow return to PLOF without restriction               2. Patient to improve B shoulder strength to 4- to 4/5 to aid in completion of household tasks such as vacuuming without limitation                3. Patient to improve thoracic spine mobility without radicular symptoms to improve occupational demands.     Plan:   Will increase Tband strength with hooklying UE strength exercises if she can tolerate. Will initiate bent over scapular strengthening and will add to HEP.     Total Session Time 40, Timed code minutes 37, and Untimed code minutes 0  THERAPEUTIC EXERCISE 37 minutes    Benjie Karvonen, PTA  11/01/2022 10:41

## 2022-11-05 ENCOUNTER — Other Ambulatory Visit: Payer: Self-pay

## 2022-11-05 ENCOUNTER — Ambulatory Visit (INDEPENDENT_AMBULATORY_CARE_PROVIDER_SITE_OTHER): Payer: Medicare Other | Admitting: OTOLARYNGOLOGY

## 2022-11-05 ENCOUNTER — Encounter (INDEPENDENT_AMBULATORY_CARE_PROVIDER_SITE_OTHER): Payer: Self-pay | Admitting: OTOLARYNGOLOGY

## 2022-11-05 ENCOUNTER — Encounter (INDEPENDENT_AMBULATORY_CARE_PROVIDER_SITE_OTHER): Payer: Medicare Other | Admitting: OTOLARYNGOLOGY

## 2022-11-05 ENCOUNTER — Ambulatory Visit (HOSPITAL_COMMUNITY)
Admission: RE | Admit: 2022-11-05 | Discharge: 2022-11-05 | Disposition: A | Discharge status: Still In Hospital | Payer: Medicare Other | Source: Ambulatory Visit

## 2022-11-05 VITALS — Ht 64.0 in | Wt 240.0 lb

## 2022-11-05 DIAGNOSIS — H6123 Impacted cerumen, bilateral: Secondary | ICD-10-CM

## 2022-11-05 DIAGNOSIS — J309 Allergic rhinitis, unspecified: Secondary | ICD-10-CM

## 2022-11-05 DIAGNOSIS — H9313 Tinnitus, bilateral: Secondary | ICD-10-CM

## 2022-11-05 DIAGNOSIS — R42 Dizziness and giddiness: Secondary | ICD-10-CM

## 2022-11-05 DIAGNOSIS — H6993 Unspecified Eustachian tube disorder, bilateral: Secondary | ICD-10-CM

## 2022-11-05 NOTE — H&P (Addendum)
[facility]  [place] [address]  Phone: [phone]  Fax: [phone]      Encounter Date: 11/05/2022    Patient ID: Dawn Riley  MRN: [id number]    DOB: [birth year]  Age: 67 y.o. female     Progress Note       Referring Provider:  No ref. provider found    Reason for Visit:   Chief Complaint   Patient presents with    Follow Up 3 Months     3 month rc. States having vertigo at times. Present for months.        History of Present Illness:  Dawn Riley is a 67 y.o. female who is FU on AR. Using Flonase. She is exposed to cats more recently, causing nasal drainage and congestion. Pt c/o room spinning vertigo for a few seconds, triggered by getting up or moving around. This has been happening for 1+ year. Also having PT on her shoulder and will experience vertigo for seconds.     Tympanogram: AU Type A    Patient History:  Patient Active Problem List   Diagnosis    Anxiety    Arthritis of carpometacarpal (CMC) joint of right thumb    Atrial fibrillation (CMS HCC)    Attention deficit hyperactivity disorder (ADHD)    Carpal tunnel syndrome of left wrist    Chest pain    Chronic pain of both shoulders    DDD (degenerative disc disease), lumbar    Disorder of bone density and structure, unspecified    HLD (hyperlipidemia)    Hyperglycemia    Impingement syndrome of shoulder region    Morton metatarsalgia    Neck pain    Nontraumatic complete tear of left rotator cuff    Obesity    OSA (obstructive sleep apnea)    Ovarian cyst    Osteoarthritis of spine with radiculopathy, cervical region    Palpitations    Postmenopause    Primary osteoarthritis of both knees    PVCs (premature ventricular contractions)    Rotator cuff tear, right    Status post total left knee replacement using cement    Tachycardia    Tear of left supraspinatus tendon    Mitral valve regurgitation     Current Outpatient Medications   Medication Sig    azelastine (ASTELIN) 137 mcg (0.1 %) Nasal Aerosol, Spray Administer  2 Sprays into each nostril Every evening Use in each nostril as directed    fluticasone propionate (FLONASE) 50 mcg/actuation Nasal Spray, Suspension     loratadine (CLARITIN) 5 mg/5 mL Oral Solution Take 10 mL (10 mg total) by mouth    LORazepam (ATIVAN) 0.5 mg Oral Tablet lorazepam 0.5 mg tablet   TAKE 1 TABLET BY MOUTH ONCE DAILY      Allergies   Allergen Reactions    Sulfamethoxazole  Other Adverse Reaction (Add comment)     Patient had numbness in her extremities    Azelastine Mental Status Effect     Felt anxious and could not sleep.     History reviewed. No pertinent past medical history.  History reviewed. No pertinent surgical history.  Family Medical History:    None         Social History     Tobacco Use    Smoking status: Never    Smokeless tobacco: Never       Review of Systems:  Review of Systems    Physical Exam:  Ht  1.626 m ('5\' 4"'$ )   Wt 109 kg (240 lb)   BMI 41.20 kg/m       Physical Exam  Constitutional:       Appearance: Normal appearance. She is well-developed, well-groomed and normal weight.   HENT:      Head: Normocephalic and atraumatic.      Right Ear: Hearing, tympanic membrane, ear canal and external ear normal. There is impacted cerumen.      Left Ear: Hearing, tympanic membrane, ear canal and external ear normal. There is impacted cerumen.      Nose: Septal deviation and mucosal edema present.      Right Turbinates: Enlarged.      Left Turbinates: Enlarged.      Mouth/Throat:      Lips: Pink.      Mouth: Mucous membranes are moist.      Pharynx: Oropharynx is clear. Uvula midline.   Eyes:      Extraocular Movements: Extraocular movements intact.   Neck:      Trachea: Phonation normal.   Pulmonary:      Effort: Pulmonary effort is normal.   Musculoskeletal:      Cervical back: Normal range of motion and neck supple.   Lymphadenopathy:      Cervical: No cervical adenopathy.   Skin:     General: Skin is warm.   Neurological:      Mental Status: She is alert and oriented to person, place,  and time.      Cranial Nerves: Cranial nerves 2-12 are intact. No facial asymmetry.   Psychiatric:         Attention and Perception: Attention normal.         Mood and Affect: Mood normal.         Speech: Speech normal.         Behavior: Behavior normal. Behavior is cooperative.         Assessment:  ENCOUNTER DIAGNOSES     ICD-10-CM   1. 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 ETD (Eustachian tube dysfunction), bilateral  H69.93   3. Chronic allergic rhinitis  J30.9   4. Vertigo  R42   5. Bilateral impacted cerumen  H61.23       Plan:  Medical records reviewed on 11/05/2022.  Cont Flonase. Will order VNG. AU cerumen debrided.   Tymps type A Au. Orthostatics negative.  Orders Placed This Encounter    [phone] - NASAL ENDOSCOPY DIAGNOSTIC UNILATERAL OR BILATERAL (AMB ONLY)    [id number] - REMOVAL IMPACTED CERUMEN W/ INSTRUMENT, UNILATERAL (AMB ONLY-PD)    AMB PRN REFERRAL EXTERNAL AUDIOLOGIST    POCT HEARING/VISION/TYMPANOGRAM (AMB ONLY)     Return for Follow up after VNG.    Mathis Dad, PA-C  11/05/2022, 09:09   The advanced practice clinician's documentation was reviewed/amended in its entirety with the assessment and plan portion completely performed independently by me during this separate encounter.

## 2022-11-05 NOTE — Procedures (Signed)
[facility]  [address]  [place] [address]    Procedure Note    Name: Dawn Riley MRN:  [id number]   Date: 11/05/2022 Age: 67 y.o.  DOB:   [birth year]       31231 - NASAL ENDOSCOPY DIAGNOSTIC UNILATERAL OR BILATERAL (AMB ONLY)    Performed by: Dia Sitter, DO  Authorized by: Dia Sitter, DO    Time Out:     Immediately before the procedure, a time out was called:  Yes    Patient verified:  Yes    Procedure Verified:  Yes    Site Verified:  Yes  Documentation:      Indications for procedure: Otalgia    Anesthesia: Oxymetazoline nasal spray    Description: Nasal endoscopy with rigid scope was performed with examination of the  septum, inferior, middle, and superior meatus, turbinates, sphenoethmoidal recess, and nasopharynx.     There were no polyps, pus, or granulation tissue noted.  ET orifices and nasopharynx were normal.     Findings: Septal deviation    The patient tolerated the procedure well.           [id number] - REMOVAL IMPACTED CERUMEN W/ INSTRUMENT, UNILATERAL (AMB ONLY-PD)    Performed by: Dia Sitter, DO  Authorized by: Dia Sitter, DO    Time Out:     Immediately before the procedure, a time out was called:  Yes    Patient verified:  Yes    Procedure Verified:  Yes    Site Verified:  Yes  Documentation:      Procedure: Cerumen cleaning  Pre-op Dx: Cerumen impaction      Bilateral EAC(s) examined under binocular microscopy.  Cerumen and/or debris was cleaned from the canal(s) using curettes, suction, and alligator forceps.  Patient tolerated procedure well.        Dia Sitter, DO

## 2022-11-05 NOTE — PT Treatment (Signed)
[facility]  Outpatient Physical Therapy  [place], [address]  [phone]  [phone]    Physical Therapy Treatment Note    Date: 11/05/2022  Patient's Name: Dawn Riley  Date of Birth: 1956/05/22            Visit #/POC: 5 of 12-16   Authorization:med nec   POC Signed?: No  POC Ends: 12/24/22  Next Progress Note Due: 10th visit or 11/19/22      Evaluating Physical Therapist: Jonell Cluck, DPT  PT diagnosis/Reason for Referral: Bil shoulder and thoracic pain  Next Scheduled Physician Appointment: TBA  Allergies/Contraindications: Sulpha drugs, Azelastine  Access Code: [email or web address]        Subjective: Patient reports no soreness with last treatment, states she is attending gym 4/week working with their nustep and UBE. States she had ENT appt and they are going to begin testing to assess if she has vertigo. She reports in the last week her pain at worst was 4/10-5    Objective:  treatment delivered as noted below.    Patient-Specific Functional Score:  Problem 10/23/22   1. Return to work restriction free 2   2. Return to household work restriction free (vacuuming/cleaning) 0   TOTAL 1      Shoulder AROM    Right        11/01/22 Left        11/01/22   Flexion 90                90 152           105   Extension 32                32 32               32   Abduction 92               110 115            120   ER AC joint     occiput C5              T2   IR L3               T9 L1              T9     EXERCISE/ACTIVITY NAME REPETITIONS RESISTANCE COMPLETED THIS DOS   Hooklying wand flexion     Static supine with towel roll along thoracic spine    Hooklying wand abduction    Hooklying horizontal abd with wand    Hooklying ceiling punch with wand   10    X2 min      10    X10    x10 2#                  2# n    n      n    n    n   Open books   x7 ea Modified this day: elbow flexed and at side n: HEP 10/25/22   Seated extension into PB   x10 No PB used this day; performed in  chair with back n: HEP 10/25/22   Standing lumbar/thoracic extension at counter   10  n: HEP 10/25/22   Hooklying UE: bilat horizontal abd  --unilateral extension then diagonal (opposite static at 90 degrees flexion) X10  X10 ea Red Tband  Red Tband  Y  y   Seated thoracic rotation with head neutral  2x5  n   Corner stretch    N: HEP 10/22/22   UBE X5 min 70 rpm y   Wall wipes: 3 directions (L, up, R)  --cirlces: CW/CCW X5 ea  X10, x7 AROM  AROM large circles Y  y   Prone scap strength:   --retraction with elbow flexion  --horizontal abd  --"Y"  --shoulder extension   X10  X10  X10  x10   Y  Y  Y  y HEP 11/05/22   Wall clocks Bilat:  X3 ea  y         Access Code: [email or web address]  URL: [email or web address]  Date: 11/05/2022  Prepared by: Benjie Karvonen    Exercises  - Prone Scapular Slide with Shoulder Extension  - 2 x daily - 7 x weekly - 1 sets - 10 reps  - Prone Scapular Retraction Arms at Side  - 2 x daily - 7 x weekly - 1 sets - 10 reps  - Prone Scapular Retraction Y  - 2 x daily - 7 x weekly - 1 sets - 10 reps  - Prone Scapular Retraction and Row  - 2 x daily - 7 x weekly - 1 sets - 10 reps    Assessment: Patient unable to perform prone scap retraction with ER secondary to scapular weakness. She was able to perform scapular strength in prone and this is how HEP will indicate her performance. Initiated wall clocks without resistance to assess tolerance prior to adding yellow Tband.     Short term goals (3 weeks):               1. Patient to decrease pain to <5/10 at worst in B shoulders and thoracic spine to aid in improved sleep quality. (MET 11/05/22)               2. Patient to improve B shoulder flexion/abduction AROM to 155 degrees not limited by pain               3. Patient to improve standing/walking tolerance to >20 minutes without limitation from mid back pain               4. Patient to be independent and complaint with HEP and progressions. (MET 11/01/22)        Long term goals (6 weeks):               1.  Patient to improve Patient Specific Functional Score to 8/10 to allow return to PLOF without restriction               2. Patient to improve B shoulder strength to 4- to 4/5 to aid in completion of household tasks such as vacuuming without limitation                3. Patient to improve thoracic spine mobility without radicular symptoms to improve occupational demands.     Plan:   Assess response to progressive strengthening and HEP. Will re-assess goals not addressed today (goal without date = not addressed)    Total Session Time 37, Timed code minutes 32, and Untimed code minutes 0  THERAPEUTIC EXERCISE 32 minutes    Benjie Karvonen, PTA  11/05/2022 10:01

## 2022-11-07 ENCOUNTER — Ambulatory Visit (HOSPITAL_COMMUNITY)
Admission: RE | Admit: 2022-11-07 | Discharge: 2022-11-07 | Disposition: A | Discharge status: Still In Hospital | Payer: Medicare Other | Source: Ambulatory Visit

## 2022-11-07 NOTE — PT Treatment (Addendum)
[facility]  Outpatient Physical Therapy  [place], [address]  [phone]  [phone]    Physical Therapy Treatment Note    Date: 11/07/2022  Patient's Name: Dawn Riley  Date of Birth: Jul 29, 1956      Visit #/POC: 6 of 12-16        Authorization:med nec        POC Signed?: No  POC Ends: 12/24/22  Next Progress Note Due: 10th visit or 11/19/22        Evaluating Physical Therapist: Jonell Cluck, DPT  PT diagnosis/Reason for Referral: Bil shoulder and thoracic pain  Next Scheduled Physician Appointment: TBA  Allergies/Contraindications: Sulpha drugs, Azelastine  Access Code: [email or web address]           Subjective: Patient notes that within the last 2-3 days she has been experiencing some pinching in her L shoulder blade/rhomboid region. Pt notes dizziness with positional changes with lying down as well as moving from sitting to standing. Pt notes she saw her ENT who is going to check if she has fluid behind her ears but would like to see if the testing here is positive.        Objective:  treatment delivered as noted below.     Patient-Specific Functional Score:  Problem 10/23/22   1. Return to work restriction free 2   2. Return to household work restriction free (vacuuming/cleaning) 0   TOTAL 1      Shoulder AROM    Right        11/01/22 Left        11/01/22   Flexion 90                90 152           105   Extension 32                32 32               32   Abduction 92               110 115            120   ER AC joint     occiput C5              T2   IR L3               T9 L1              T9      11/07/22 + L Dix halpike  treated with Epley maneuver (per pt request)    EXERCISE/ACTIVITY NAME REPETITIONS RESISTANCE COMPLETED THIS DOS   Hooklying wand flexion      Static supine with towel roll along thoracic spine     Hooklying wand abduction     Hooklying horizontal abd with wand     Hooklying ceiling punch with wand    10     X2 min        10     X10     x10  2#                          2# n     n        n     n     n   Open books    x7 ea Modified this  day: elbow flexed and at side n: HEP 10/25/22   Seated extension into PB    x10 No PB used this day; performed in chair with back n: HEP 10/25/22   Standing lumbar/thoracic extension at counter    10   n: HEP 10/25/22   Hooklying UE: bilat horizontal abd  --unilateral extension then diagonal (opposite static at 90 degrees flexion) X10  X10 ea Red Tband  Red Tband n  n   Seated thoracic rotation with head neutral  2x5   n   Corner stretch      N: HEP 10/22/22   UBE X5 min 70 rpm Y   Wall wipes: 3 directions (L, up, R)  --cirlces: CW/CCW X5 ea  X10, x7 AROM  AROM large circles n  n   Prone scap strength:   --retraction with elbow flexion  --horizontal abd  --"Y"  --shoulder extension    X10  X10  X10  x10    Y  Y  Y  y HEP 11/05/22   Wall clocks Bilat:  X3 ea   n    STM to L levator scap     Yes   Seated scapular mobilization with movement + isometrics   Yes   Doorway Rhomboid Stretch 3x30  Yes + HEP 11/07/22   Seated Levator Scapulae Stretch  3x30  Yes + HEP 11/07/22   Seated Upper Trapezius Stretch    Yes + HEP 11/07/22   Access Code: [email or web address]  URL: [email or web address]  Date: 11/07/2022  Prepared by: Jonell Cluck    Exercises  - Doorway Rhomboid Stretch  - 1-2 x daily - 7 x weekly - 3 reps - 30 hold  - Seated Levator Scapulae Stretch  - 1-2 x daily - 7 x weekly - 3 reps - 30 hold  - Seated Upper Trapezius Stretch  - 1-2 x daily - 7 x weekly - 3 reps - 30 hold]      Assessment: Patient was treated for + dix halpike with L epley maneuver self reporting reduction in dizziness symptoms following. Reviewed positions and things to avoid following the maneuver for 24 hours. STM to L levator scap insertion with decreased muscle pliability noted in sitting. Reviewed stretches to target this region     Short term goals (3 weeks):               1. Patient to decrease pain to <5/10 at worst in B shoulders and thoracic spine to aid  in improved sleep quality. (MET 11/05/22)               2. Patient to improve B shoulder flexion/abduction AROM to 155 degrees not limited by pain               3. Patient to improve standing/walking tolerance to >20 minutes without limitation from mid back pain               4. Patient to be independent and complaint with HEP and progressions. (MET 11/01/22)        Long term goals (6 weeks):               1. Patient to improve Patient Specific Functional Score to 8/10 to allow return to PLOF without restriction               2. Patient to improve B shoulder strength to 4- to 4/5 to aid in completion of household tasks such as vacuuming without limitation  3. Patient to improve thoracic spine mobility without radicular symptoms to improve occupational demands.      Plan:   Assess response to progressive strengthening and HEP. Will re-assess goals not addressed today (goal without date = not addressed. Last tx focused on treating vertigo.    Total Session Time 38 and Timed code minutes 35  THERAPEUTIC EXERCISE 35 minutes      Jonell Cluck, PT  11/07/2022, 08:18

## 2022-11-08 ENCOUNTER — Ambulatory Visit (HOSPITAL_COMMUNITY): Payer: Self-pay

## 2022-11-13 ENCOUNTER — Other Ambulatory Visit: Payer: Self-pay

## 2022-11-13 ENCOUNTER — Ambulatory Visit (HOSPITAL_COMMUNITY)
Admission: RE | Admit: 2022-11-13 | Discharge: 2022-11-13 | Disposition: A | Discharge status: Still In Hospital | Payer: Medicare Other | Source: Ambulatory Visit

## 2022-11-13 NOTE — PT Treatment (Signed)
[facility]  Outpatient Physical Therapy  [place], [address]  [phone]  [phone]    Physical Therapy Treatment Note    Date: 11/13/2022  Patient's Name: Dawn Riley  Date of Birth: April 12, 1956      Visit #/POC: 7 of 12-16        Authorization:med nec        POC Signed?: No  POC Ends: 12/24/22  Next Progress Note Due: 10th visit or 11/19/22        Evaluating Physical Therapist: Jonell Cluck, DPT  PT diagnosis/Reason for Referral: Bil shoulder and thoracic pain  Next Scheduled Physician Appointment: TBA  Allergies/Contraindications: Sulpha drugs, Azelastine  Access Code: [email or web address]           Subjective: Patient reports she is 50% improved since [facility]. She reports her standing and amb tolerance is 15-20 min.      Objective:  treatment delivered as noted below.     Patient-Specific Functional Score:  Problem 10/23/22     11/13/22   1. Return to work restriction free 2                  4   2. Return to household work restriction free (vacuuming/cleaning) 0                  3   TOTAL 1                 3.5      Shoulder AROM    Right        11/13/22 Left        11/13/22   Flexion 90                118 152           120   Extension 32                32 32               32   Abduction 92               120 115            135   ER AC joint     T2 C5              T2   IR L3               T9 L1              T9          EXERCISE/ACTIVITY NAME REPETITIONS RESISTANCE COMPLETED THIS DOS   Hooklying wand flexion      Static supine with towel roll along thoracic spine     Hooklying wand abduction     Hooklying horizontal abd with wand     Hooklying ceiling punch with wand    10     X2 min        10     X10     x10 2#                          2# n     n        n     n     n   Open books    x7 ea Modified this day: elbow flexed and at  side n: HEP 10/25/22   Seated extension into PB    x10 No PB used this day; performed in chair with back n: HEP 10/25/22   Standing lumbar/thoracic  extension at counter    10   n: HEP 10/25/22   Hooklying UE: bilat horizontal abd  --unilateral extension then diagonal (opposite static at 90 degrees flexion) X10  X10 ea Red Tband  Red Tband n  n   Seated thoracic rotation with head neutral  2x5   n   Corner stretch      N: HEP 10/22/22   UBE X5 min 70 rpm n   Wall wipes: 3 directions (L, up, R)  --cirlces: CW/CCW X5 ea  X10, x7 AROM  AROM large circles n  n   Prone scap strength:   --retraction with elbow flexion  --horizontal abd  --"Y"  --shoulder extension    X10  X10  X10  x10    Y  Y  Y  y HEP 11/05/22   Wall clocks Bilat:  X3 ea   n    STM to L levator scap     Yes   Seated scapular mobilization with movement + isometrics   Yes   Doorway Rhomboid Stretch 3x30  Yes + HEP 11/07/22   Seated Levator Scapulae Stretch  3x30  Yes + HEP 11/07/22   Seated Upper Trapezius Stretch    Yes + HEP 11/07/22   Wall angels 2x6  Y: HEP 11/13/22   PB rolls at door 10x 5 sec  y   Standing thoracic rotation at door x5  Y: hand drawn HEP 11/13/22   Machine rows x8 30# y   Seated lat pull downs x10 20# y   Sidelying abd bilat X20  AROM y   Wall clocks bilat X5 ea Red Tband Y: HEP 11/13/22   Access Code: [email or web address]  URL: [email or web address]  Date: 11/13/2022  Prepared by: Benjie Karvonen    Exercises  - Wall Angels  - 1 x daily - 7 x weekly - 3 sets - 10 reps  - Wall Clock with Theraband  - 1 x daily - 7 x weekly - 3 sets - 10 reps    Assessment: Patient unable to fully open chest with wall angels. Patient requested some of the new exercises today be applied to HEP. Hand drawn instructions were given for standing thoracic rotation against wall as no picture found on exercise program template. She was also instructed to drink water to reduce soreness in response to progressive strengthening. She seems to tolerate well and she does have zero c/o this day of dizziness with transitions.     Short term goals (3 weeks):               1. Patient to decrease pain to <5/10 at worst in B shoulders  and thoracic spine to aid in improved sleep quality. (MET 11/05/22)               2. Patient to improve B shoulder flexion/abduction AROM to 155 degrees not limited by pain (Progressing 11/13/22)               3. Patient to improve standing/walking tolerance to >20 minutes without limitation from mid back pain (Progressing 11/13/22)               4. Patient to be independent and complaint with HEP and progressions. (MET 11/01/22)        Long term goals (6  weeks):               1. Patient to improve Patient Specific Functional Score to 8/10 to allow return to PLOF without restriction (progressing 11/13/22)               2. Patient to improve B shoulder strength to 4- to 4/5 to aid in completion of household tasks such as vacuuming without limitation                3. Patient to improve thoracic spine mobility without radicular symptoms to improve occupational demands.      Plan:   Assess response to progressive strengthening and continue based on presentation.     Total Session Time 47 and Timed code minutes 45  THERAPEUTIC EXERCISE 45 minutes    Benjie Karvonen, PTA  11/13/2022 10:01

## 2022-11-15 ENCOUNTER — Ambulatory Visit (HOSPITAL_COMMUNITY)
Admission: RE | Admit: 2022-11-15 | Discharge: 2022-11-15 | Disposition: A | Discharge status: Still In Hospital | Payer: Medicare Other | Source: Ambulatory Visit

## 2022-11-15 ENCOUNTER — Other Ambulatory Visit: Payer: Self-pay

## 2022-11-15 NOTE — PT Treatment (Signed)
[facility]  Outpatient Physical Therapy  [place], [address]  [phone]  [phone]    Physical Therapy Treatment Note    Date: 11/15/2022  Patient's Name: Dawn Riley  Date of Birth: 02-03-1956      Visit #/POC: 8 of 12-16        Authorization:med nec        POC Signed?: No  POC Ends: 12/24/22  Next Progress Note Due: 10th visit or 11/19/22        Evaluating Physical Therapist: Jonell Cluck, DPT  PT diagnosis/Reason for Referral: Bil shoulder and thoracic pain  Next Scheduled Physician Appointment: TBA  Allergies/Contraindications: Sulpha drugs, Azelastine  Access Code: [email or web address]           Subjective: Patient reports she experienced soreness in response to last visit and HEP, however, the more she continued working with HEP the soreness resolved. She states she ate prior to her appt and will be unable to perform exercises laying prone or sidelying this session.      Objective:  treatment delivered as noted below.     Patient-Specific Functional Score:  Problem 10/23/22     11/13/22   1. Return to work restriction free 2                  4   2. Return to household work restriction free (vacuuming/cleaning) 0                  3   TOTAL 1                 3.5      Shoulder AROM    Right        11/13/22 Left        11/13/22   Flexion 90                118 152           120   Extension 32                32 32               32   Abduction 92               120 115            135   ER AC joint     T2 C5              T2   IR L3               T9 L1              T9          EXERCISE/ACTIVITY NAME REPETITIONS RESISTANCE COMPLETED THIS DOS   Hooklying wand flexion      Static supine with towel roll along thoracic spine     Hooklying wand abduction     Hooklying horizontal abd with wand     Hooklying ceiling punch with wand    10     X2 min        10     X10     x10 2#                          2# n     n        n  n     n   Open books    x7 ea Modified this day: elbow  flexed and at side n: HEP 10/25/22  D/C to standing   Seated extension into PB    x10 No PB used this day; performed in chair with back n: HEP 10/25/22   Standing lumbar/thoracic extension at counter    10   n: HEP 10/25/22   Hooklying UE: bilat horizontal abd  --unilateral extension then diagonal (opposite static at 90 degrees flexion) X10  X10 ea Red Tband  Red Tband n  n   Seated thoracic rotation with head neutral  2x5   n   Corner stretch   5x 10 sec   y: HEP 10/22/22   UBE X5 min 70 rpm y   Wall wipes: 3 directions (L, up, R)  --cirlces: CW/CCW X5 ea  X10, x7 AROM  AROM large circles n  n   Prone scap strength:   --retraction with elbow flexion  --horizontal abd  --"Y"  --shoulder extension    X10  X10  X10  x10    Y  Y  Y  y HEP 11/05/22    STM to L levator scap     n   Seated scapular mobilization with movement + isometrics   n   Doorway Rhomboid Stretch 3x30  n + HEP 11/07/22   Seated Levator Scapulae Stretch  3x30  n + HEP 11/07/22   Seated Upper Trapezius Stretch    n + HEP 11/07/22   Wall angels x10  Y: HEP 11/13/22   PB rolls at door 10x 5 sec  y   Standing thoracic rotation at door X7 ea  Y: hand drawn HEP 11/13/22   Machine rows x10 30# y   Seated lat pull downs x10 20# y   Sidelying abd bilat X20  AROM n   Wall clocks bilat X5 ea Red Tband Y: HEP 11/13/22   Standing IR/ER x10 Yellow Tband Y   Standing bilat ER x10 Yellow Tband y   Wall push ups: wide BOS x10  y     Assessment: During session she reports feeling as though she is gaining strength. She performed exercises with good form that required very little correction.     Short term goals (3 weeks):               1. Patient to decrease pain to <5/10 at worst in B shoulders and thoracic spine to aid in improved sleep quality. (MET 11/05/22)               2. Patient to improve B shoulder flexion/abduction AROM to 155 degrees not limited by pain (Progressing 11/13/22)               3. Patient to improve standing/walking tolerance to >20 minutes without limitation  from mid back pain (Progressing 11/13/22)               4. Patient to be independent and complaint with HEP and progressions. (MET 11/01/22)        Long term goals (6 weeks):               1. Patient to improve Patient Specific Functional Score to 8/10 to allow return to PLOF without restriction (progressing 11/13/22)               2. Patient to improve B shoulder strength to 4- to 4/5 to aid in completion of  household tasks such as vacuuming without limitation                3. Patient to improve thoracic spine mobility without radicular symptoms to improve occupational demands.      Plan:   Assess her response to today's progressive strengthening exercises.    Total Session Time 35 and Timed code minutes 35  THERAPEUTIC EXERCISE 35 minutes    Benjie Karvonen, PTA  11/15/2022 10:01

## 2022-11-19 ENCOUNTER — Other Ambulatory Visit: Payer: Self-pay

## 2022-11-19 ENCOUNTER — Ambulatory Visit
Admission: RE | Admit: 2022-11-19 | Discharge: 2022-11-19 | Disposition: A | Discharge status: Still In Hospital | Payer: Medicare Other | Source: Ambulatory Visit

## 2022-11-19 NOTE — PT Treatment (Signed)
[facility]  Outpatient Physical Therapy  [place], [address]  [phone]  [phone]    Physical Therapy Treatment Note    Date: 11/19/2022  Patient's Name: Dawn Riley  Date of Birth: 1956-02-11  Physical Therapy Visit        Visit #/POC: 9 of 12-16        Authorization:med nec        POC Signed?: No  POC Ends: 12/24/22  Next Progress Note Due: 19th visit or 12/17/22        Evaluating Physical Therapist: Jonell Cluck, DPT; SUPERVISING PHYSICAL THERAPIST TRANSITIONED TO MEG Abbygail [place]ho[place], MPT  PT diagnosis/Reason for Referral: Bil shoulder and thoracic pain  Next Scheduled Physician Appointment: TBA  Allergies/Contraindications: Sulpha drugs, Azelastine  Access Code: [email or web address]           Subjective: Olla NOTES IMPROVEMENTS IN THERAPY INCLUDING STANDING FOR 15-20 MINUTES, STANDING 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 SHOULDER PAIN OCCURS WITH MOVEMENTS AS WELL AS AT REST. She NOTES MRI DOES SHOW BILAT ROTATOR CUFF TEARS.     Objective:  BILAT SHOULDER AROM UNCHANGED FROM 3/5.  SIGNIFICANT TENDERNESS NOTED BILAT BICEP>POST ROTATOR CUFF. COMPENSATORY MECHANICS NOTED BILAT. WORST PAIN PAST WEEK IS RATED 5/10 AND IS AT PRESENT. TRIAL OF INHIBITORY KINESIOTAPE TO BILAT BICEP AND SUPRA/INFRA/TERES. HAD PATIENT SIGN RELEASE FORM FOR MRI/XRAY OF SHOULDERS/THORACIC SPINE FROM [facility].     Patient-Specific Functional Score:  Problem 10/23/22     11/13/22   1. Return to work restriction free 2                  4   2. Return to household work restriction free (vacuuming/cleaning) 0                  3   TOTAL 1                 3.5      Shoulder AROM    Right        11/13/22 Left        11/13/22   Flexion 90                118 152           120   Extension 32                32 32               32   Abduction 92               120 115            135   ER AC joint     T2 C5              T2   IR L3               T9 L1              T9             EXERCISE/ACTIVITY NAME REPETITIONS RESISTANCE COMPLETED THIS DOS   Hooklying wand flexion      Static supine with towel roll along thoracic spine     Hooklying wand abduction     Hooklying horizontal abd with wand     Hooklying ceiling punch with wand    10     X2 min  10     X10     x10 2#                          2# n     n        n     n     n   Open books    x7 ea Modified this day: elbow flexed and at side n: HEP 10/25/22  D/C to standing   Seated extension into PB    x10 No PB used this day; performed in chair with back n: HEP 10/25/22   Standing lumbar/thoracic extension at counter    10   n: HEP 10/25/22   Hooklying UE: bilat horizontal abd  --unilateral extension then diagonal (opposite static at 90 degrees flexion) X10  X10 ea Red Tband  Red Tband n  n   Seated thoracic rotation with head neutral  2x5   n   Corner stretch   5x 10 sec   n: HEP 10/22/22   UBE X5 min 70 rpm n   Wall wipes: 3 directions (L, up, R)  --cirlces: CW/CCW X5 ea  X10, x7 AROM  AROM large circles n  n   Prone scap strength:   --retraction with elbow flexion  --horizontal abd  --"Y"  --shoulder extension    X10  X10  X10  x10    Y  Y  Y  y HEP 11/05/22    STM to L levator scap     n   Seated scapular mobilization with movement + isometrics     n   Doorway Rhomboid Stretch 3x30   n + HEP 11/07/22   Seated Levator Scapulae Stretch  3x30   n + HEP 11/07/22   Seated Upper Trapezius Stretch      n + HEP 11/07/22   Wall angels x10   n: HEP 11/13/22   PB rolls at door 10x 5 sec   n   Standing thoracic rotation at door X7 ea   n: hand drawn HEP 11/13/22   Machine rows x10 30# n   Seated lat pull downs x10 20# n   Sidelying abd bilat X20  AROM n   Wall clocks bilat X5 ea Red Tband n: HEP 11/13/22   Standing IR/ER x10 Yellow Tband n   Standing bilat ER x10 Yellow Tband n   Wall push ups: wide BOS x10   n   Re-assess  Review HEP   Y  y   KT TAPE L/R SHOULDERS   Y      Assessment: Marlean HAS ATTENDED 9 PT VISITS BETWEEN 10/22/22 AND 11/19/22 WITH DX OF  BILATERAL SHOULDER AND THORACIC PAIN. THERAPY HAS INCLUDED PATIENT EDUCATION, MANUAL THERAPY, AND EXERCISES FOR MOBILITY AND STRENGTHENING. 2/4 STG MET, 0/0 LTG MET. CONTINUED SKILLED THERAPY IS RECOMMENDED FOR APPROPRIATE PROGRESSION OF THERAPEUTIC INTERVENTION TO MAXIMIZE INDEPENDENCE AND FUNCTION.     Short term goals (3 weeks):               1. Patient to decrease pain to <5/10 at worst in B shoulders and thoracic spine to aid in improved sleep quality. (MET 11/05/22)               2. Patient to improve B shoulder flexion/abduction AROM to 155 degrees not limited by pain (Progressing 11/13/22)               3. Patient  to improve standing/walking tolerance to >20 minutes without limitation from mid back pain (Progressing 11/13/22)               4. Patient to be independent and complaint with HEP and progressions. (MET 11/01/22)        Long term goals (6 weeks):               1. Patient to improve Patient Specific Functional Score to 8/10 to allow return to PLOF without restriction (progressing 11/13/22)               2. Patient to improve B shoulder strength to 4- to 4/5 to aid in completion of household tasks such as vacuuming without limitation                3. Patient to improve thoracic spine mobility without radicular symptoms to improve occupational demands.        Plan: ASSESS RESPONSE TO KT TAPE. REVIEW MRI AND ADJUST POC/GOALS IF NECESSARY.    Total Session Time 35 and Timed code minutes 30  THERAPEUTIC EXERCISE 30 minutesINCLUDES KT TIME      Garrel Ridgel, PT  11/19/2022, 13:32

## 2022-11-23 ENCOUNTER — Other Ambulatory Visit: Payer: Self-pay

## 2022-11-23 ENCOUNTER — Ambulatory Visit (HOSPITAL_COMMUNITY)
Admission: RE | Admit: 2022-11-23 | Discharge: 2022-11-23 | Disposition: A | Discharge status: Still In Hospital | Payer: Medicare Other | Source: Ambulatory Visit

## 2022-11-23 DIAGNOSIS — M546 Pain in thoracic spine: Secondary | ICD-10-CM | POA: Insufficient documentation

## 2022-11-23 NOTE — PT Treatment (Signed)
[facility]  Outpatient Physical Therapy  [place], [address]  [phone]  [phone]    Physical Therapy Treatment Note    Date: 11/23/2022  Patient's Name: Dawn Riley  Date of Birth: [birth year]  Physical Therapy Visit        Visit #/POC: 10 of 12-16        Authorization:med nec        POC Signed?: No  POC Ends: 12/24/22  Next Progress Note Due: 19th visit or 12/17/22        Evaluating Physical Therapist: Jonell Cluck, DPT; SUPERVISING PHYSICAL THERAPIST TRANSITIONED TO MEG CARPENTER, MPT  PT diagnosis/Reason for Referral: Bil shoulder and thoracic pain  Next Scheduled Physician Appointment: TBA  Allergies/Contraindications: Sulpha drugs, Azelastine  Access Code: [email or web address]           Subjective: Patient reports the kinesio tape was extremely beneficial in reducing bilat shoulder pain that allowed her to have much improved sleep. She states with decreased shoulder pain she felt she was better able to better focus on the thoracic spine.     Objective:  treatment delivered as noted below. (No re-assessments this day with measurements or goals this visit).     Patient-Specific Functional Score:  Problem 10/23/22     11/13/22   1. Return to work restriction free 2                  4   2. Return to household work restriction free (vacuuming/cleaning) 0                  3   TOTAL 1                 3.5      Shoulder AROM    Right        11/13/22 Left        11/13/22   Flexion 90                118 152           120   Extension 32                32 32               32   Abduction 92               120 115            135   ER AC joint     T2 C5              T2   IR L3               T9 L1              T9            EXERCISE/ACTIVITY NAME REPETITIONS RESISTANCE COMPLETED THIS DOS   Hooklying wand flexion      Static supine with towel roll along thoracic spine     Hooklying wand abduction     Hooklying horizontal abd with wand     Hooklying ceiling punch with wand    10      X2 min        10     X10     x10 2#  2# n     n        n     n     n   Open books    x7 ea Modified this day: elbow flexed and at side n: HEP 10/25/22  D/C to standing   Seated extension into PB    x10 No PB used this day; performed in chair with back n: HEP 10/25/22   Standing lumbar/thoracic extension at counter    10   n: HEP 10/25/22   Hooklying UE: bilat horizontal abd  --unilateral extension then diagonal (opposite static at 90 degrees flexion) X10  X10 ea Red Tband  Red Tband n  n   Seated thoracic rotation with head neutral  x10   y   Corner stretch   5x 10 sec  with slight Cobra addition to the stretch y: HEP 10/22/22   UBE X5 min 70 rpm n   Wall wipes: 3 directions (L, up, R)  --cirlces: CW/CCW X5 ea  X10, x7 AROM  AROM large circles n  n   Prone scap strength:   --retraction with elbow flexion  --horizontal abd  --"Y"  --shoulder extension    X10  X10  X10  x10    Y  Y  Y  y HEP 11/05/22    STM to L levator scap     n   Seated scapular mobilization with movement + isometrics     n   Doorway Rhomboid Stretch 3x30   n + HEP 11/07/22   Seated Levator Scapulae Stretch  3x30   n + HEP 11/07/22   Seated Upper Trapezius Stretch      n + HEP 11/07/22   Wall angels 2x10   y: HEP 11/13/22   PB rolls at door 10x 5 sec   n   Standing thoracic rotation at door X7 ea   n: hand drawn HEP 11/13/22   Machine rows 2x10 30# y   Seated lat pull downs X10, x5 20# y   Sidelying abd bilat X20  AROM n   Wall clocks bilat X5 ea Red Tband n: HEP 11/13/22   Standing IR/ER x10 Yellow Tband n   Standing bilat ER x10 Yellow Tband n   Wall push ups: wide BOS x10   n   Re-assess  Review HEP   n  n   KT TAPE L/R SHOULDERS   n      Assessment: She has much improved ROM/mobility with standing open books at wall as she visibly is able to rotate with UE surpassing 90 degrees which initially was her limit. She tolerated treatment well. Was unable to increase weight with machines, but did increase reps. Reviewed MRI as directed via  last visit note. Post treatment patient reports she feels as though she is able to stand more erect.    Short term goals (3 weeks):               1. Patient to decrease pain to <5/10 at worst in B shoulders and thoracic spine to aid in improved sleep quality. (MET 11/05/22)               2. Patient to improve B shoulder flexion/abduction AROM to 155 degrees not limited by pain (Progressing 11/13/22)               3. Patient to improve standing/walking tolerance to >20 minutes without limitation from mid back pain (Progressing 11/13/22)  4. Patient to be independent and complaint with HEP and progressions. (MET 11/01/22)        Long term goals (6 weeks):               1. Patient to improve Patient Specific Functional Score to 8/10 to allow return to PLOF without restriction (progressing 11/13/22)               2. Patient to improve B shoulder strength to 4- to 4/5 to aid in completion of household tasks such as vacuuming without limitation                3. Patient to improve thoracic spine mobility without radicular symptoms to improve occupational demands.        Plan:  Continue progressing towards goals.     Total Session Time 50 and Timed code minutes 45  THERAPEUTIC EXERCISE 45 minutes    Benjie Karvonen, PTA  11/23/2022 12:29

## 2022-11-26 ENCOUNTER — Ambulatory Visit (HOSPITAL_COMMUNITY)
Admission: RE | Admit: 2022-11-26 | Discharge: 2022-11-26 | Disposition: A | Discharge status: Still In Hospital | Payer: Medicare Other | Source: Ambulatory Visit

## 2022-11-26 ENCOUNTER — Other Ambulatory Visit: Payer: Self-pay

## 2022-11-26 NOTE — PT Treatment (Signed)
[facility]  Outpatient Physical Therapy  [place], [address]  [phone]  [phone]    Physical Therapy Treatment Note    Date: 11/26/2022  Patient's Name: Dawn Riley  Date of Birth: Dec 14, 1955  Physical Therapy Visit        Visit #/POC: 11 of 12-16        Authorization:med nec        POC Signed?: No  POC Ends: 12/24/22  Next Progress Note Due: 19th visit or 12/17/22        Evaluating Physical Therapist: Jonell Cluck, DPT; SUPERVISING PHYSICAL THERAPIST TRANSITIONED TO MEG CARPENTER, MPT  PT diagnosis/Reason for Referral: Bil shoulder and thoracic pain  Next Scheduled Physician Appointment: TBA  Allergies/Contraindications: Sulpha drugs, Azelastine  Access Code: [email or web address]           Subjective: Patient reports she contacted Dr Knox Royalty office last Fri 11/23/22, but they were closed and then has tried to contact them today to request order adding bilat shoulders for therapy. Upon arrival she is reporting experiencing a "pinching" L distal scalene.     Objective:  treatment delivered as noted below.      Patient-Specific Functional Score:  Problem 10/23/22     11/13/22   1. Return to work restriction free 2                  4   2. Return to household work restriction free (vacuuming/cleaning) 0                  3   TOTAL 1                 3.5      Shoulder AROM    Right        11/13/22 Left        11/13/22   Flexion 90                118 152           120   Extension 32                32 32               32   Abduction 92               120 115            135   ER AC joint     T2 C5              T2   IR L3               T9 L1              T9            EXERCISE/ACTIVITY NAME REPETITIONS RESISTANCE COMPLETED THIS DOS   Hooklying wand flexion      Static supine with towel roll along thoracic spine     Hooklying wand abduction     Hooklying horizontal abd with wand     Hooklying ceiling punch with wand    10     X2 min        10     X10     x10 2#                           2#  n     n        n     n     n   Open books    x7 ea Modified this day: elbow flexed and at side n: HEP 10/25/22  D/C to standing   Seated extension into PB    x10 No PB used this day; performed in chair with back n: HEP 10/25/22   Standing lumbar/thoracic extension at counter    10   n: HEP 10/25/22   Hooklying UE: bilat horizontal abd  --unilateral extension then diagonal (opposite static at 90 degrees flexion) X10  X10 ea Red Tband  Red Tband n  n   Seated thoracic rotation with head neutral  x10   y   Corner stretch   5x 10 sec  no cobra stretch y: HEP 10/22/22   UBE X5 min 70 rpm n   Wall wipes: 3 directions (L, up, R)  --cirlces: CW/CCW X5 ea  X10, x7 AROM  AROM large circles n  n   Prone scap strength:   --retraction with elbow flexion  --horizontal abd  --"Y"  --shoulder extension    X10  X10  X10  x10    Y  Y  Y  y HEP 11/05/22    STM to L levator scap     n   Seated scapular mobilization with movement + isometrics     n   Doorway Rhomboid Stretch 3x30   n + HEP 11/07/22   Seated Levator Scapulae Stretch  3x30   n + HEP 11/07/22   Seated Upper Trapezius Stretch      n + HEP 11/07/22   Wall angels 2x10   y: HEP 11/13/22   PB rolls at door 10x 5 sec   n   Standing thoracic rotation at door X7 ea   n: hand drawn HEP 11/13/22   Machine rows 2x10 30# y   Seated lat pull downs X10, x5 20# y   Sidelying abd bilat X20  AROM n   Wall clocks bilat X5 ea Red Tband n: HEP 11/13/22   Standing IR/ER x10 Yellow Tband n   Standing bilat ER x10 Yellow Tband n   Wall push ups: wide BOS x10   n   Re-assess  Review HEP   n  n   KT TAPE L/R SHOULDERS   n   L scalene stretch 3x 10 sec active Y: HEP 11/26/22   Bent over row 2x10 2# y          Access Code: [email or web address]  URL: [email or web address]  Date: 11/26/2022  Prepared by: Benjie Karvonen    Exercises  - Seated Scalenes Stretch  - 3 x daily - 7 x weekly - 1 sets - 3 reps - 10 sec hold    Assessment: Patient was shown how to actively/indep stretch scalene muscles for home.     Short  term goals (3 weeks):               1. Patient to decrease pain to <5/10 at worst in B shoulders and thoracic spine to aid in improved sleep quality. (MET 11/05/22)               2. Patient to improve B shoulder flexion/abduction AROM to 155 degrees not limited by pain (Progressing 11/13/22)               3. Patient to improve standing/walking tolerance to >20 minutes without  limitation from mid back pain (Progressing 11/13/22)               4. Patient to be independent and complaint with HEP and progressions. (MET 11/01/22)        Long term goals (6 weeks):               1. Patient to improve Patient Specific Functional Score to 8/10 to allow return to PLOF without restriction (progressing 11/13/22)               2. Patient to improve B shoulder strength to 4- to 4/5 to aid in completion of household tasks such as vacuuming without limitation                3. Patient to improve thoracic spine mobility without radicular symptoms to improve occupational demands.        Plan:  Assess response to treatment and continue progressing   Total Session Time 50 and Timed code minutes 45  THERAPEUTIC EXERCISE 45 minutes    Benjie Karvonen, PTA  11/26/2022 15:11

## 2022-11-27 ENCOUNTER — Ambulatory Visit (HOSPITAL_COMMUNITY): Payer: Self-pay

## 2022-11-28 ENCOUNTER — Ambulatory Visit (HOSPITAL_COMMUNITY)
Admission: RE | Admit: 2022-11-28 | Discharge: 2022-11-28 | Disposition: A | Discharge status: Still In Hospital | Payer: Medicare Other | Source: Ambulatory Visit

## 2022-11-28 ENCOUNTER — Other Ambulatory Visit: Payer: Self-pay

## 2022-11-28 NOTE — PT Treatment (Signed)
[facility]  Outpatient Physical Therapy  [place], [address]  [phone]  [phone]    Physical Therapy Treatment Note    Date: 11/28/2022  Patient's Name: Dawn Riley  Date of Birth: 27-Oct-1955  Physical Therapy Visit        Visit #/POC: 12 of 60      Authorization:med nec        POC Signed?: No  POC Ends: 12/24/22  Next Progress Note Due: 19th visit or 12/17/22        Evaluating Physical Therapist: Jonell Cluck, DPT; SUPERVISING PHYSICAL THERAPIST TRANSITIONED TO MEG CARPENTER, MPT  PT diagnosis/Reason for Referral: Bil shoulder and thoracic pain  Next Scheduled Physician Appointment: TBA  Allergies/Contraindications: Sulpha drugs, Azelastine  Access Code: [email or web address]           Subjective: Patient reports good response to last visit with no c/o soreness. She states she has been compliant with scalene stretching and it has been very beneficial in reducing this muscle pain. Order was received electronically adding bilat shoulder pain to treatment.     Objective:  treatment delivered as noted below.      Patient-Specific Functional Score:  Problem 10/23/22     11/13/22   1. Return to work restriction free 2                  4   2. Return to household work restriction free (vacuuming/cleaning) 0                  3   TOTAL 1                 3.5      Shoulder AROM    Right        11/13/22 Left        11/13/22   Flexion 90                118 152           120   Extension 32                32 32               32   Abduction 92               120 115            135   ER AC joint     T2 C5              T2   IR L3               T9 L1              T9            EXERCISE/ACTIVITY NAME REPETITIONS RESISTANCE COMPLETED THIS DOS   Hooklying wand flexion      Static supine with towel roll along thoracic spine     Hooklying wand abduction     Hooklying horizontal abd with wand     Hooklying ceiling punch with wand    10     X2 min        10     X10     x10 2#                           2# n  n        n     n     n   Open books    x7 ea Modified this day: elbow flexed and at side n: HEP 10/25/22  D/C to standing   Seated extension into PB    x10 No PB used this day; performed in chair with back n: HEP 10/25/22   Standing lumbar/thoracic extension at counter    10   n: HEP 10/25/22   Hooklying UE: bilat horizontal abd  --unilateral extension then diagonal (opposite static at 90 degrees flexion) X10  X10 ea Red Tband  Red Tband n  n   Seated thoracic rotation with head neutral  x10   n   Corner stretch   5x 10 sec  no cobra stretch n: HEP 10/22/22   UBE X5 min 70 rpm n   Wall wipes: 3 directions (L, up, R)  --cirlces: CW/CCW X5 ea  X10, x7 AROM  AROM large circles n  n   Prone scap strength:   --retraction with elbow flexion  --horizontal abd  --"Y"  --shoulder extension    X10  X10  X10  x10    Y  Y  Y  y N: HEP 11/05/22    STM to L levator scap     n   Seated scapular mobilization with movement + isometrics     n   Doorway Rhomboid Stretch 3x30   n + HEP 11/07/22   Seated Levator Scapulae Stretch  3x30   n + HEP 11/07/22   Seated Upper Trapezius Stretch      n + HEP 11/07/22   Wall angels 2x10   y: HEP 11/13/22   PB rolls at door 10x 5 sec   n   Standing thoracic rotation at door X7 ea   n: hand drawn HEP 11/13/22   Machine rows x10 35# y   Seated lat pull downs X10, x5 20# y   Sidelying abd bilat X20  AROM n   Wall clocks bilat X5 ea Red Tband n: HEP 11/13/22   Standing IR/ER x10 Yellow Tband n   Standing bilat ER x10 Yellow Tband n   Wall push ups: wide BOS x10   n   Re-assess  Review HEP   n  n   KT TAPE L/R SHOULDERS   n   L scalene stretch 3x 10 sec active n: HEP 11/26/22     Bent over row  --horizontal abd  --shoulder flexion  --shoulder extension   2x10  X10  X10  x10   2#  AROM  AROM  AROM HEP  3/20  Y  Y  Y  y   PNF D2 flexion x10 AROM y    Access Code: [facility]  URL: [email or web address]  Date: 11/28/2022  Prepared by: Benjie Karvonen    Exercises  - Standing Bent Over Single Arm  Scapular Row with Table Support  - 2 x daily - 7 x weekly - 1 sets - 10 reps  - Single Arm Bent Over Shoulder Horizontal Abduction with Dumbbell - Palm Down  - 2 x daily - 7 x weekly - 1 sets - 10 reps  - Single Arm Bent Over Shoulder Scaption with Dumbbell  - 2 x daily - 7 x weekly - 1 sets - 10 reps  - Single Arm Bent Over Shoulder Extension with Dumbbell  - 2 x daily - 7 x weekly - 1 sets - 10  reps  - Standing Shoulder Single Arm PNF D2 Flexion with Resistance  - 2 x daily - 7 x weekly - 1 sets - 10 reps    Assessment:  During seated lat pull she is unable to increase wt or reps and notes after second rep of x5 "I am feeling it" and points to L distal infraspinatus region. She was able to tolerate 5# wt increase with machine row. PNF D2 flexion with difficulty R>L.    Short term goals (3 weeks):               1. Patient to decrease pain to <5/10 at worst in B shoulders and thoracic spine to aid in improved sleep quality. (MET 11/05/22)               2. Patient to improve B shoulder flexion/abduction AROM to 155 degrees not limited by pain (Progressing 11/13/22)               3. Patient to improve standing/walking tolerance to >20 minutes without limitation from mid back pain (Progressing 11/13/22)               4. Patient to be independent and complaint with HEP and progressions. (MET 11/01/22)        Long term goals (6 weeks):               1. Patient to improve Patient Specific Functional Score to 8/10 to allow return to PLOF without restriction (progressing 11/13/22)               2. Patient to improve B shoulder strength to 4- to 4/5 to aid in completion of household tasks such as vacuuming without limitation                3. Patient to improve thoracic spine mobility without radicular symptoms to improve occupational demands.        Plan:  Assess response to progression of treatment this day. Patient to see PT Meg next visit to assess bilat shoulders via new order.     Total Session Time 37 and Timed code minutes  37  THERAPEUTIC EXERCISE 37 minutes    Benjie Karvonen, PTA  11/28/2022 09:17

## 2022-11-29 NOTE — Progress Notes (Signed)
HPI:  Dawn Riley is a 67 y.o. female with PMH of obesity and PSH of incarcerated umbilical hernia repair referred for evaluation of gallbladder disease by Dr. Ronnie Derby after reports of right upper back pain beginning around the same time that Dawn Riley in December 2023. Her only symptom is exertional right back pain that is relieved with rest. The onset of symptoms is not related to eating. She is not having any nausea, vomiting, pain with eating, or other abdominal pain. 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     Past Medical History:    Past Medical History:   Diagnosis Date   . Arthritis of carpometacarpal [facility]) joint of right thumb 11/25/2020   . Atrial flutter (CMS/HCC) 11/07/2009   . Carpal tunnel syndrome of left wrist 11/25/2020   . DDD (degenerative disc disease), lumbar 01/09/2017   . HLD (hyperlipidemia) 01/09/2017   . Hx of staphylococcal infection    . Hyperglycemia 01/09/2017   . Joint pain    . Morton metatarsalgia 01/09/2017   . Obesity    . OSA (obstructive sleep apnea) 02/04/2019    w/ CPAP   . Ovarian cyst 01/09/2017   . Palpitations 11/07/2009   . 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 PVCs (premature ventricular contractions) 11/07/2009    Overview:  IMO R2.2 load   . Rotator cuff tear, right 01/09/2017    Overview:  2012   . Shortness of breath    . Tear of left supraspinatus tendon 01/09/2017    Overview:  06/2012   . 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 CARPAL TUNNEL RELEASE Right 2004    Procedure: CARPAL TUNNEL RELEASE   . FOOT SURGERY      Procedure: FOOT SURGERY; TOENAIL REMOVAL LEFT GREAT TOE   . HERNIA REPAIR  02/2018    Procedure: HERNIA REPAIR   . KNEE SURGERY Left 2002    Procedure: KNEE SURGERY   . KNEE SURGERY Right 2007    Procedure: KNEE SURGERY; Arthroscopy w/ Meniscectomy   .  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 TOTAL KNEE ARTHROPLASTY Right 06/06/2020    Procedure: TOTAL KNEE ARTHROPLASTY;  Surgeon: Louann Liv, MD;  Location: [phone] MAIN OR;  Service: Orthopedics;  Laterality: Right;          Current Outpatient Medications:   .  acetaminophen (TYLENOL) 500 mg tablet, Take 1,000 mg by mouth every 6 (six) hours., Disp: 100 tablet, Rfl: 0  .  carboxymethylcellulose-glycerin (Refresh Optive) 0.5-0.9 % drop ophthalmic solution, Administer  into affected eye(s)., Disp: , Rfl:   .  cefdinir (OMNICEF) 300 mg capsule, , Disp: , Rfl:   .  celecoxib (CeleBREX) 200 mg capsule, Take 1 capsule by mouth every 12 (twelve) hours., Disp: , Rfl:   .  cholecalciferol (VITAMIN D3) 2,000 unit cap capsule, Take 2,000 Units by mouth Once Daily., Disp: , Rfl:   .  clobetasoL (TEMOVATE) 0.05 % ointment, , Disp: 60 g, Rfl: 11  .  ergocalciferol (VITAMIN D2) 1,250 mcg (50,000 unit) capsule, Take 50,000 Units by mouth once a week., Disp: , Rfl:   .  finasteride (PROSCAR) 5 mg tablet, Take 2.5 mg by mouth Once Daily., Disp: 45 tablet, Rfl: 4  .  fluticasone propionate (FLONASE) 50 mcg/spray nasal spray, Administer 2 sprays into each nostril daily as needed for rhinitis or allergies., Disp: , Rfl:   .  ibuprofen (MOTRIN) 800 mg tablet, Take 1 tablet by mouth daily., Disp: , Rfl:   .  loratadine (CLARITIN) 5 mg/5 mL solution, Take 10 mg by mouth daily as needed for allergies., Disp: , Rfl:   .  minoxidiL 2 % soln, Apply 1 Application topically Once Daily. Indications: hair loss, Disp: , Rfl:   .  mometasone 0.1 % soln, , Disp: , Rfl:   .  multivit-min36/iron/folic acid (GERITOL COMPLETE ORAL), Take by mouth., Disp: , Rfl:   .  sulfamethoxazole-trimethoprim (BACTRIM) 200-40 mg/5 mL suspension, , Disp: , Rfl:     Allergies   Allergen Reactions   . Sulfamethoxazole Other  (See Comments)     Patient had numbness in her extremities   . Azelastine Angioedema     Felt anxious and could not sleep.        Social History     Tobacco Use   . Smoking status: Former     Current packs/day: 0.00     Types: Cigarettes     Quit date: 09/10/1977     Years since quitting: 45.2   . Smokeless tobacco: Never   Substance Use Topics   . Alcohol use: Not Currently        Family History   Problem Relation Name Age of Onset   . Sleep apnea Sister     . Sleep apnea Daughter     . Asthma Mother     . Diabetes Father     . Bone cancer Brother     . Anesthesia problems Neg Hx            Review of Systems   Respiratory:  Negative for shortness of breath and wheezing.    Gastrointestinal:  Negative for blood in stool, constipation, diarrhea, nausea and vomiting.   Genitourinary:  Negative for difficulty urinating and frequency.   Musculoskeletal:  Positive for back pain. Negative for myalgias.   Neurological:  Negative for light-headedness (associated with Riley use).           Vitals:    11/29/22 1302   BP: (!) 122/51   Pulse: 56   SpO2: 100%     Weight: 106 kg (233 lb 6.4 oz)   Body mass index is 40.06 kg/m.    Physical Exam  Eyes:      Extraocular Movements: Extraocular movements intact.   Cardiovascular:      Rate and Rhythm: Normal rate and regular rhythm.   Pulmonary:      Effort: Pulmonary effort is normal.      Breath sounds: Normal breath sounds.   Abdominal:      Palpations: Abdomen is soft.      Tenderness: There is abdominal tenderness (mild, in periumbilical area).      Comments: Diastasis recti appreciated from xyphoid to umbilicus on valsalva.         A/P:  Dawn Riley is a 67 y.o. female with PMH of obesity here for evaluation of gallbladder disease after a HIDA scan showed EF of 15%. Her only symptom is back pain and she has not gotten an US of the gallbladder. She is having no abdominal pain, digestive problems, pain after eating, nausea, or vomiting. Given  that patient is asymptomatic,  there is no concern at this time for gallbladder disease and surgery is not recommended. Patient expressed understanding and she will follow up with PCP about resuming Riley and weight loss regimen.

## 2022-12-03 ENCOUNTER — Ambulatory Visit (HOSPITAL_COMMUNITY): Payer: Self-pay

## 2022-12-04 ENCOUNTER — Ambulatory Visit (HOSPITAL_COMMUNITY): Payer: Self-pay

## 2022-12-05 ENCOUNTER — Encounter (HOSPITAL_COMMUNITY): Payer: Self-pay

## 2022-12-05 ENCOUNTER — Ambulatory Visit (HOSPITAL_COMMUNITY)
Admission: RE | Admit: 2022-12-05 | Discharge: 2022-12-05 | Disposition: A | Discharge status: Still In Hospital | Payer: Medicare Other | Source: Ambulatory Visit

## 2022-12-05 DIAGNOSIS — M1990 Unspecified osteoarthritis, unspecified site: Secondary | ICD-10-CM | POA: Insufficient documentation

## 2022-12-05 DIAGNOSIS — M179 Osteoarthritis of knee, unspecified: Secondary | ICD-10-CM | POA: Insufficient documentation

## 2022-12-05 DIAGNOSIS — M546 Pain in thoracic spine: Secondary | ICD-10-CM | POA: Insufficient documentation

## 2022-12-05 DIAGNOSIS — M25511 Pain in right shoulder: Secondary | ICD-10-CM | POA: Insufficient documentation

## 2022-12-05 DIAGNOSIS — M545 Low back pain, unspecified: Secondary | ICD-10-CM | POA: Insufficient documentation

## 2022-12-05 NOTE — PT Evaluation (Signed)
[facility]  Outpatient Physical Therapy  [place], [address]  [phone]  [phone]       Physical Therapy Upper Extremity Evaluation    Date: 12/05/2022  Patient's Name: Dawn Riley  Date of Birth: [birth year]  Physical Therapy Evaluation      PT diagnosis/Reason for Referral: Bilateral shoulder pain, Thoracic back pain, and Osteoarthritis unspecified type and site             SUBJECTIVE  Date of onset: ~1 year prior (12/04/2021)    Mechanism of injury: No MOI.    Previous episodes/treatments: PT for the bilateral knees.    PLOF: No issues prior to these bilateral shoulder tears    Medications for this problem:  On file    Diagnostic tests: MRI of the R and L shoulders obtained on 06/25/2022.   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 Impingement of the supraspinatus musculotendinous junction.  -L shoulder indications are a partial tear of the supraspinatus tendon and possibility bursitis of the subscapularis.    Patient goals: REDUCE PAIN, RETURN TO WORK, and NORMALIZE FUNCTION    Occupation:  [employer]    Next MD visit: 12/27/2022    Pain location: Bilateral shoulders                  Pain description: ACHING and BURNING    Pain frequency:  CONTINUOUS    Pain rating: Now 5   Best 5   Worst 8    Radiculopathy: L hand symptoms, but this is due to carpal tunnel.    Pain increases with: ADLs, ACTIVITY, EXERCISE, and LIFTING           decreases with : POSITION CHANGE and REST    Sensation: L hand numbness    Weakness: L hand weakness    Sleep affected: 5-6 hours    Headaches: Every other day    Dizziness: Only occurs following restroom visits    Subjective Functional Reports:    Sitting: LIMITED    Standing: WFL    Walking: WFL    Lifting: LIMITED        Patient-Specific Functional Score:    Problem Score   1. Washing the dishes 5   2. Dressing 3.5   3.  Dusting/Vacuuming  3   TOTAL 4     OBJECTIVE    Shoulder AROM   right left   Flexion 101 121   Extension 44 39   Abduction 89 88   ER 58 58   IR 42 27       Elbow AROM    right left   Flexion WFL WFl   Extension [facility] WFL       Strength (defined as __/5 based on 0/5 - 5/5 grading system)     right left   Shoulder flexion 3+ 4-   Shoulder abduction  4- 4   Shoulder IR 3 5   Shoulder ER 4- 5   Elbow flexion  4+ 5   Elbow extension  4 5   Wrist extension  4+ 4-   Wrist flexion  4- 4-     Strength comment: Scapular compensation for abduction bilaterally.    Palpation: TTP over the bilateral supraspinatus attachment sites.    Posture: Increased Thoracic kyphosis and rounded shoulders     Special tests: No special testing performed,  MRI already obtained.    Treatment provided:REVIEW OF POC AND GOALS WITH PATIENT, ALL QUESTIONS ANSWERED and PATIENT EDUCATION  Access Code: [email or web address]  URL: [email or web address]  Date: 12/05/2022  Prepared by: Adalberto Cole    Exercises  - Seated Upper Trapezius Stretch  - 2-3 x daily - 7 x weekly - 5 reps - 10s hold  - Seated Shoulder Row with Anchored Resistance  - 1-2 x daily - 7 x weekly - 3 sets - 12 reps  - Sidelying Shoulder External Rotation  - 1-2 x daily - 7 x weekly - 3 sets - 8 reps  - Standing Isometric Shoulder Internal Rotation at Doorway  - 1-2 x daily - 7 x weekly - 2 sets - 5 reps - 5s hold  - Seated Shoulder Flexion AAROM with Pulley Behind  - 2-3 x daily - 7 x weekly - 12 reps - 2s hold  - Seated Shoulder Scaption AAROM with Pulley at Side  - 2-3 x daily - 7 x weekly - 12 reps - 2s hold          ASSESSMENT    Impression: Patient presents with bilateral shoulder pain as well as thoracic pain following no specific MOI that can be recalled. 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 MMTing is as expected as is AROM. I did have a  discussion with the patient regarding the likelihood of conservative treatment vs surgery being effective in regard to regaining function and that it is possible for the L shoulder, but unlikely for the R side. Patient would benefit from skilled physical therapy services in order to address deficits in bilateral shoulder strength, AROM, and overhead ADLs for improved completion of household tasks and community involvement.      Rehab potential: FAIR and POOR      Short term goals (4 weeks):  -Patient will achieve a resting pain level of no greater than 3/10 in the bilateral shoulders.  -Patient will be able to reach L shoulder end ROM for all 5 motions measured and without an onset of pain greater than 4/10.  -Patient will achieve a global L shoulder MMT of at least 4 and without an onset of pain greater than 4/10.  -Patient will be independent with her HEP.      Long term goals (8 weeks):  -Patient will achieve a resting pain level of no greater than 1/10 in the bilateral shoulders.  -Patient will be able to reach L shoulder end ROM for all 5 motions measured and without an onset of pain greater than 2/10.  -Patient will achieve a global L shoulder MMT of at least 4 and without an onset of pain greater than 2/10.  -Patient will be independent with her final HEP.            PLAN  Patient will attend 1-2 times per week x 6 weeks. Therapy may include, but is not limited to THERAPEUTIC EXERCISES, MYOFASCIAL/JOINT MOBILIZATION, POSTURE/BODY MECHANICS, ERGONOMIC TRAINING, HOME INSTRUCTIONS, HEAT/COLD, ELECTRICAL STIMULATION, and KINESIOTAPE    Plan for next visit: Continue with low level resistance exercise for the bilateral shoulders and focus on higher rep count to promote blood flow per patient tolerance. Trial kinesiotape to cue for stability for the shoulders if desired.       Evaluation complexity:   Personal factors impacting POC: PRE-EXISTING FUNCTIONAL LIMITATIONS   Co-morbidities  impacting POC:  OBESITY  Complexity of physical exam: INCLUDING MUSCULOSKELETAL SYSTEM (POSTURE, ROM, STRENGTH, HEIGHT/WEIGHT) and INCLUDING ACTIVITY/MOBILITY RESTRICTIONS   Clinical Presentation: STABLE   Evaluation Complexity: LOW-HISTORY 0, EXAMINATION 1-2, STABLE PRESENTATION        Total Session Time 40        Intervention minutes: EVALUATION 36 minutes and THERAPEUTIC EXERCISE 4 minutes    Adalberto Cole, PT  12/05/2022, 08:07    Start of Service: _________          Certification:    From:______  Through:_________    I certify the need for these services furnished under this plan of treatment and while under my care.    Referring Provider Signature: _______________     Date : _____________________    Printed Name of Referring Provider: __________________________________________

## 2022-12-07 ENCOUNTER — Ambulatory Visit (HOSPITAL_COMMUNITY)
Admission: RE | Admit: 2022-12-07 | Discharge: 2022-12-07 | Disposition: A | Discharge status: Still In Hospital | Payer: Medicare Other | Source: Ambulatory Visit

## 2022-12-07 ENCOUNTER — Other Ambulatory Visit: Payer: Self-pay

## 2022-12-07 DIAGNOSIS — M25512 Pain in left shoulder: Secondary | ICD-10-CM

## 2022-12-07 DIAGNOSIS — M546 Pain in thoracic spine: Secondary | ICD-10-CM

## 2022-12-07 DIAGNOSIS — M1990 Unspecified osteoarthritis, unspecified site: Secondary | ICD-10-CM

## 2022-12-07 NOTE — PT Treatment (Signed)
[facility]  Outpatient Physical Therapy  [place], [address]  [phone]  [phone]    Physical Therapy Treatment Note    Date: 12/07/2022  Patient's Name: Dawn Riley  Date of Birth: 1956-05-24  Physical Therapy Visit            Visit #/POC:2 of 6-12  Authorization:med necc  POC Signed?: No  POC Ends: 4/15  Next Progress Note Due4/27 or 10th visit       Evaluating Physical Therapist: Adalberto Cole, PT  PT diagnosis/Reason for Referral: Bilateral shoulder pain, Thoracic back pain, and Osteoarthritis unspecified type and site   Next Scheduled Physician Appointment: 4/18  Allergies/Contraindications:           Subjective: Reports she is continues to have pain in (B) shoulders. States she does have difficulty using her her (R) arm for all  personal hygiene.     Objective: Treatment delivered as follows:     Measured ROM: Shoulder AROM 3/27    right left   Flexion 101 121   Extension 44 39   Abduction 89 88   ER 58 58   IR 42 27     EXERCISE/ACTIVITY NAME REPETITIONS RESISTANCE COMPLETED THIS DOS     Supine horizontal abd  x20 YTB y   Rows  x20 YTB y   Scapular  protraction x15  y   Standing shoulder ext x20 YTB y   ER/IR walkouts 2x5 YTB y   KT 2 strips stabilization   y                           Assessment: Noted RC weakness. Altered GH mechanics d/t  weakness. Patient does fatigue quickly. Noted she did have a slight increase in pain following treatment. Reviewed KT precautions and donn/doff instruction(handout given to patient). She verbalized understanding.     Short term goals (4 weeks):  -Patient will achieve a resting pain level of no greater than 3/10 in the bilateral shoulders.  -Patient will be able to reach L shoulder end ROM for all 5 motions measured and without an onset of pain greater than 4/10.  -Patient will achieve a global L shoulder MMT of at least 4 and without an onset of pain greater than 4/10.  -Patient will be independent with  her HEP.        Long term goals (8 weeks):  -Patient will achieve a resting pain level of no greater than 1/10 in the bilateral shoulders.  -Patient will be able to reach L shoulder end ROM for all 5 motions measured and without an onset of pain greater than 2/10.  -Patient will achieve a global L shoulder MMT of at least 4 and without an onset of pain greater than 2/10.  -Patient will be independent with her final HEP.    Plan: Continue low level  strengthening per POC. Progress as tolerated.     Total Session Time 35 and Timed code minutes 35  THERAPEUTIC EXERCISE 35 minutes KT time included      Carlton Adam, PTA  12/07/2022, 10:21

## 2022-12-17 ENCOUNTER — Ambulatory Visit (HOSPITAL_COMMUNITY): Payer: Self-pay

## 2022-12-19 ENCOUNTER — Ambulatory Visit
Admission: RE | Admit: 2022-12-19 | Discharge: 2022-12-19 | Disposition: A | Discharge status: Still In Hospital | Payer: Medicare Other | Source: Ambulatory Visit

## 2022-12-19 ENCOUNTER — Other Ambulatory Visit: Payer: Self-pay

## 2022-12-19 DIAGNOSIS — M25511 Pain in right shoulder: Secondary | ICD-10-CM

## 2022-12-19 DIAGNOSIS — M546 Pain in thoracic spine: Secondary | ICD-10-CM

## 2022-12-19 DIAGNOSIS — M1990 Unspecified osteoarthritis, unspecified site: Secondary | ICD-10-CM

## 2022-12-19 NOTE — PT Treatment (Signed)
[facility] Medicine [facility]  Outpatient Physical Therapy  [address]  [place], [address]  [phone]  (Fax) [phone]    Physical Therapy Treatment Note    Date: 12/19/2022  Patient's Name: Dawn Riley  Date of Birth: Jan 21, 1956  Physical Therapy Visit           Visit #/POC:3 of 6-12  Authorization:med necc  POC Signed?: No  POC Ends: 4/15  Next Progress Note Due4/27 or 10th visit         Evaluating Physical Therapist: Creig Hines, PT  PT diagnosis/Reason for Referral: Bilateral shoulder pain, Thoracic back pain, and Osteoarthritis unspecified type and site   Next Scheduled Physician Appointment: 4/18  Allergies/Contraindications:               Subjective: Reports the KT helped decrease her pain and it also felt more stable. Rates her pain today 4/10. States she will call today to get referred to orthopedic. States she continues to have difficulty with personal care especially with (R) UE.     Objective: Treatment delivered as follows:      Measured ROM: Shoulder AROM 3/27    right left   Flexion 101 121   Extension 44 39   Abduction 89 88   ER 58 58   IR 42 27      EXERCISE/ACTIVITY NAME REPETITIONS RESISTANCE COMPLETED THIS DOS      Supine horizontal abd  x20 YTB y   Rows  x20 YTB y   Scapular  protraction x15   y   Standing shoulder ext x20 YTB y   ER/IR walkouts 2x5 YTB y   KT 2 strips stabilization (B) shoulders     y    wand flexion  Chest press  x10        wall slides (B) UE  2x7    y                      Assessment: All treatment performed in seated or standing position secondary to vertigo symptoms increasing when placed supine. Following KT she reports improved stability. Tolerated slight progression of therex.        Short term goals (4 weeks):  -Patient will achieve a resting pain level of no greater than 3/10 in the bilateral shoulders.  -Patient will be able to reach L shoulder end ROM for all 5 motions measured and without an onset of pain greater than 4/10.  -Patient  will achieve a global L shoulder MMT of at least 4 and without an onset of pain greater than 4/10.  -Patient will be independent with her HEP.        Long term goals (8 weeks):  -Patient will achieve a resting pain level of no greater than 1/10 in the bilateral shoulders.  -Patient will be able to reach L shoulder end ROM for all 5 motions measured and without an onset of pain greater than 2/10.  -Patient will achieve a global L shoulder MMT of at least 4 and without an onset of pain greater than 2/10.  -Patient will be independent with her final HEP.     Plan: Continue low level  strengthening per POC. Progress as tolerated.            Total Session Time 38 and Timed code minutes 38  THERAPEUTIC EXERCISE 38 minutes KT time included      Trilby Leaver, PTA  12/19/2022, 07:59

## 2022-12-21 ENCOUNTER — Other Ambulatory Visit: Payer: Self-pay

## 2022-12-21 ENCOUNTER — Ambulatory Visit (HOSPITAL_COMMUNITY)
Admission: RE | Admit: 2022-12-21 | Discharge: 2022-12-21 | Disposition: A | Discharge status: Still In Hospital | Payer: Medicare Other | Source: Ambulatory Visit

## 2022-12-21 DIAGNOSIS — M546 Pain in thoracic spine: Secondary | ICD-10-CM

## 2022-12-21 DIAGNOSIS — M25512 Pain in left shoulder: Secondary | ICD-10-CM | POA: Insufficient documentation

## 2022-12-21 DIAGNOSIS — M1990 Unspecified osteoarthritis, unspecified site: Secondary | ICD-10-CM

## 2022-12-21 DIAGNOSIS — M25511 Pain in right shoulder: Secondary | ICD-10-CM | POA: Insufficient documentation

## 2022-12-21 NOTE — PT Treatment (Signed)
[facility] Medicine [facility]  Outpatient Physical Therapy  [address]  [place], [address]  [phone]  (Fax) [phone]    Physical Therapy Treatment Note    Date: 12/21/2022  Patient's Name: Dawn Riley  Date of Birth: 21-Feb-1956  Physical Therapy Visit           Visit #/POC: 4of 6-12  Authorization:med necc  POC Signed?: No  POC Ends: 4/15  Next Progress Note Due 4/27 or 10th visit       Evaluating Physical Therapist: Creig Hines, PT  PT diagnosis/Reason for Referral: Bilateral shoulder pain, Thoracic back pain, and Osteoarthritis unspecified type and site   Next Scheduled Physician Appointment: 4/18  Allergies/Contraindications:    Access Code: [email or web address]            Subjective: Patient reports the kinesio taping definitely helps to decrease pain, allows for improved sleep, and better mobility. She states she has not yet called for orthopedic consult.     Objective: Treatment delivered as follows:      Measured ROM: Shoulder AROM 3/27    right left   Flexion 101 121   Extension 44 39   Abduction 89 88   ER 58 58   IR 42 27      EXERCISE/ACTIVITY NAME REPETITIONS RESISTANCE COMPLETED THIS DOS      Supine horizontal abd     --ceiling punch   2x10   YTB    AROM only   Y: HEP 12/21/22    y   Rows  2x10 Red Tband y   Scapular  protraction x15   n   Standing shoulder ext x20 Red TB y   ER/IR walkouts x10 YTB y   KT 2 strips stabilization (B) shoulders     n    wand flexion  Chest press  2x10    y  n    wall slides (B) UE  2x7    n   UBE  X5 min  70 rpm y   HEP given at eval:  Exercises  - Seated Upper Trapezius Stretch  - 2-3 x daily - 7 x weekly - 5 reps - 10s hold  - Seated Shoulder Row with Anchored Resistance  - 1-2 x daily - 7 x weekly - 3 sets - 12 reps  - Sidelying Shoulder External Rotation  - 1-2 x daily - 7 x weekly - 3 sets - 8 reps  - Standing Isometric Shoulder Internal Rotation at Doorway  - 1-2 x daily - 7 x weekly - 2 sets - 5 reps - 5s hold  - Seated Shoulder Flexion AAROM  with Pulley Behind  - 2-3 x daily - 7 x weekly - 12 reps - 2s hold  - Seated Shoulder Scaption AAROM with Pulley at Side  - 2-3 x daily - 7 x weekly - 12 reps - 2s hold   Reviewed on this day    Access Code: [email or web address]  URL: [email or web address]  Date: 12/21/2022  Prepared by: Tarri Abernethy    Exercises  - Seated Upper Trapezius Stretch  - 2-3 x daily - 7 x weekly - 5 reps - 10s hold  - Seated Shoulder Row with Anchored Resistance  - 1-2 x daily - 7 x weekly - 3 sets - 12 reps  - Sidelying Shoulder External Rotation  - 1-2 x daily - 7 x weekly - 3 sets - 8 reps  - Standing Isometric Shoulder Internal Rotation at Doorway  -  1-2 x daily - 7 x weekly - 2 sets - 5 reps - 5s hold  - Seated Shoulder Flexion AAROM with Pulley Behind  - 2-3 x daily - 7 x weekly - 12 reps - 2s hold  - Seated Shoulder Scaption AAROM with Pulley at Side  - 2-3 x daily - 7 x weekly - 12 reps - 2s hold  - Supine Shoulder Horizontal Abduction with Resistance  - 1 x daily - 7 x weekly - 3 sets - 10 reps        Assessment: Patient was able to tolerate supine position this day for scapular supported strengthening of bilat shoulders.  Reprinted HEP  given at eval with review along with 1 additional exercise added today, patient reports she thought her only HEP exercise was to be performing at home were the pulleys. Kinesio tape still secured with no need to re-tape. She tolerated increased Tband strength (from yellow to red) with standing scapular rows and standing shoulder extension.  Patient has tendency to be apprehensive with progression of exercises for fear of increased pain. No pain/soreness c/o reported at end of session.      Short term goals (4 weeks):  -Patient will achieve a resting pain level of no greater than 3/10 in the bilateral shoulders.  -Patient will be able to reach L shoulder end ROM for all 5 motions measured and without an onset of pain greater than 4/10.  -Patient will achieve a global L shoulder MMT of at least 4 and  without an onset of pain greater than 4/10.  -Patient will be independent with her HEP.        Long term goals (8 weeks):  -Patient will achieve a resting pain level of no greater than 1/10 in the bilateral shoulders.  -Patient will be able to reach L shoulder end ROM for all 5 motions measured and without an onset of pain greater than 2/10.  -Patient will achieve a global L shoulder MMT of at least 4 and without an onset of pain greater than 2/10.  -Patient will be independent with her final HEP.     Plan:  Assess tolerance to treatment and HEP given this visit.  Will need re-taping next visit.       Total Session Time 43 and Timed code minutes 43  THERAPEUTIC EXERCISE 43 minutes    Tarri Abernethy, PTA  12/21/2022 10:23

## 2022-12-24 ENCOUNTER — Ambulatory Visit (HOSPITAL_COMMUNITY): Payer: Self-pay

## 2022-12-25 ENCOUNTER — Ambulatory Visit (HOSPITAL_COMMUNITY): Payer: Self-pay

## 2022-12-25 ENCOUNTER — Encounter (INDEPENDENT_AMBULATORY_CARE_PROVIDER_SITE_OTHER): Payer: Self-pay | Admitting: OTOLARYNGOLOGY

## 2022-12-28 ENCOUNTER — Encounter (HOSPITAL_COMMUNITY): Payer: Self-pay

## 2022-12-28 ENCOUNTER — Ambulatory Visit (HOSPITAL_COMMUNITY): Payer: Self-pay

## 2023-01-02 ENCOUNTER — Other Ambulatory Visit (HOSPITAL_COMMUNITY): Payer: Self-pay | Admitting: FAMILY PRACTICE

## 2023-01-02 ENCOUNTER — Ambulatory Visit (HOSPITAL_COMMUNITY)
Admission: RE | Admit: 2023-01-02 | Discharge: 2023-01-02 | Disposition: A | Discharge status: Still In Hospital | Payer: Medicare Other | Source: Ambulatory Visit

## 2023-01-02 ENCOUNTER — Encounter (HOSPITAL_COMMUNITY): Payer: Self-pay

## 2023-01-02 DIAGNOSIS — M25512 Pain in left shoulder: Secondary | ICD-10-CM

## 2023-01-02 NOTE — PT Evaluation (Signed)
[facility] Medicine [facility]  Outpatient Physical Therapy  [address]  [place], [address]  [phone]  (Fax) [phone]       Physical Therapy Upper Extremity Evaluation    Date: 01/02/2023  Patient's Name: Dawn Riley  Date of Birth: 03/29/1956  Physical Therapy Evaluation    PT diagnosis/Reason for Referral: Bilateral Shoulder Pain             SUBJECTIVE  Date of onset: ~1 year prior (12/04/2021)     Mechanism of injury: No MOI.     Previous episodes/treatments: PT for the bilateral knees.     PLOF: No issues prior to these bilateral shoulder tears     Medications for this problem:  On file     Diagnostic tests: MRI of the R and L shoulders obtained on 06/25/2022.   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 Impingement of the supraspinatus musculotendinous junction.  -L shoulder indications are a partial tear of the supraspinatus tendon and possibility bursitis of the subscapularis.     Patient goals: REDUCE PAIN, RETURN TO WORK, and NORMALIZE FUNCTION     Occupation:  [employer]     Next MD visit: None scheduled at this time     Pain location: Bilateral shoulders                  Pain description: ACHING and BURNING     Pain frequency:  CONTINUOUS     Pain rating: Now 8   Best 6   Worst 8     Radiculopathy: L hand symptoms, but this is due to carpal tunnel.     Pain increases with: ADLs, ACTIVITY, EXERCISE, and LIFTING           decreases with : POSITION CHANGE and REST     Sensation: L hand numbness     Weakness: L hand weakness     Sleep affected: 5-6 hours     Headaches: Every other day     Dizziness: Only occurs following restroom visits     Subjective Functional Reports:     Sitting: LIMITED     Standing: WFL     Walking: WFL     Lifting: LIMITED           Patient-Specific Functional Score:     Problem Score   1. Washing the dishes 4   2. Dressing 3.5   3. Dusting/Vacuuming  3    TOTAL 4        OBJECTIVE    Shoulder AROM   right left   Flexion 77 114   Extension 34 42   Abduction 92 102   ER 34 70   IR 26 38       Elbow AROM     right left   Flexion WFL WFl   Extension [facility] WFL       Strength (defined as __/5 based on 0/5 - 5/5 grading system)     right left   Shoulder flexion 4 4+   Shoulder abduction  4- 4   Shoulder extension 4 4+   Shoulder IR 4- 4-   Shoulder ER 5 5     Strength comment: Strength while in proper testing position is adequate. I am aware that this does not demonstrate the patient's inability to overcome gravity for AROM motions and achieve a technical 3/5 MMT or higher, but  her level of strength while in normal testing positions for strength is [facility].    Palpation: TTP over the bilateral supraspinatus attachment sites.     Posture: Increased Thoracic kyphosis and rounded shoulders     Reflexes    Biceps Normal bilat   Triceps Absent response R and diminished L   Brachioradialis Diminished bilat     Special tests: No special testing performed, MRI already obtained.     Treatment provided:REVIEW OF POC AND GOALS WITH PATIENT, ALL QUESTIONS ANSWERED and PATIENT EDUCATION  Access Code: [email or web address]  URL: [email or web address]  Date: 12/05/2022  Prepared by: Creig Hines     Exercises  - Seated Upper Trapezius Stretch  - 2-3 x daily - 7 x weekly - 5 reps - 10s hold  - Seated Shoulder Row with Anchored Resistance  - 1-2 x daily - 7 x weekly - 3 sets - 12 reps  - Sidelying Shoulder External Rotation  - 1-2 x daily - 7 x weekly - 3 sets - 8 reps  - Standing Isometric Shoulder Internal Rotation at Doorway  - 1-2 x daily - 7 x weekly - 2 sets - 5 reps - 5s hold  - Seated Shoulder Flexion AAROM with Pulley Behind  - 2-3 x daily - 7 x weekly - 12 reps - 2s hold  - Seated Shoulder Scaption AAROM with Pulley at Side  - 2-3 x daily - 7 x weekly - 12 reps - 2s hold          ASSESSMENT    Impression: Patient presents with bilateral shoulder pain as well as thoracic pain following no  specific MOI that can be recalled. 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 AROM is as expected based on the results of the MRI. Strength is unusual as she has a strength level which is WFL while at her side in normal testing positions, but clearly is not able to overcome gravity in order to achieve a technical 3/5 or greater MMTing score. I did again have a discussion with the patient regarding the likelihood of conservative treatment vs surgery being effective in regard to regaining function and that it is possible for the L shoulder, but unlikely for the R side depending on what her goals are. Patient would benefit from skilled physical therapy services in order to address deficits in bilateral shoulder strength, AROM, and overhead ADLs for improved completion of household tasks and community involvement.     Rehab potential: FA[facility] and PO[facility]      Short term goals (3 weeks):  -Patient will achieve a resting pain level of no greater than 3/10 in the bilateral shoulders.  -Patient will be able to reach L shoulder end ROM for all 5 motions measured and without an onset of pain greater than 4/10.  -Patient will achieve a global L shoulder MMT of at least 4 and without an onset of pain greater than 4/10.  -Patient will be independent with her HEP.      Long term goals (6 weeks):  -Patient will achieve a resting pain level of no greater than 1/10 in the bilateral shoulders.  -Patient will be able to reach L shoulder end ROM for all 5 motions measured and without an onset of pain greater than 2/10.  -Patient will achieve a global L shoulder MMT of at least 4 and without an onset  of pain greater than 2/10.  -Patient will be independent with her final HEP.            PLAN  Patient will attend 2 times per week x 6 weeks. Therapy may include, but is not limited to THERAPEUTIC EXERCISES,  MYOFASCIAL/JOINT MOBILIZATION, POSTURE/BODY MECHANICS, ERGONOMIC TRAINING, HOME INSTRUCTIONS, HEAT/COLD, ULTRASOUND, and KINESIOTAPE    Plan for next visit: Continue with low level resistance exercise for the bilateral shoulders and focus on higher rep count to promote blood flow per patient tolerance. Also continue with kinesiotape to cue for stability for the shoulders as the patient has noted prior success with this technique.       Evaluation complexity:               Personal factors impacting POC: PRE-EXISTING FUNCTIONAL LIMITATIONS               Co-morbidities impacting POC: OBESITY  Complexity of physical exam: INCLUDING MUSCULOSKELETAL SYSTEM (POSTURE, ROM, STRENGTH, HEIGHT/WEIGHT) and INCLUDING ACTIVITY/MOBILITY RESTRICTIONS               Clinical Presentation: STABLE               Evaluation Complexity: LOW-HISTORY 0, EXAMINATION 1-2, STABLE PRESENTATION        Total Session Time 46 and Untimed code minutes 11        Intervention minutes: EVALUATION 29 minutes and THERAPEUTIC EXERCISE 6 minutes    Creig Hines, PT  01/02/2023, 13:37    Start of Service: _________          Certification:    From:______  Through:_________    I certify the need for these services furnished under this plan of treatment and while under my care.    Referring Provider Signature: _______________     Date : _____________________    Printed Name of Referring Provider: __________________________________________            .Creig Hines

## 2023-01-04 ENCOUNTER — Ambulatory Visit (HOSPITAL_COMMUNITY): Payer: Self-pay

## 2023-01-07 ENCOUNTER — Ambulatory Visit (HOSPITAL_COMMUNITY)
Admission: RE | Admit: 2023-01-07 | Discharge: 2023-01-07 | Disposition: A | Discharge status: Still In Hospital | Payer: Medicare Other | Source: Ambulatory Visit

## 2023-01-07 ENCOUNTER — Ambulatory Visit (HOSPITAL_COMMUNITY): Payer: Self-pay

## 2023-01-07 NOTE — PT Treatment (Signed)
[facility] Medicine [facility]  Outpatient Physical Therapy  [address]  [place], [address]  [phone]  (Fax) [phone]    Physical Therapy Treatment Note    Date: 01/07/2023  Patient's Name: Dawn Riley  Date of Birth: [birth year]  Physical Therapy Visit            Visit #/POC: 2/12  Authorization:med nec  POC Signed?: no  POC Ends: 02/20/23  Next Progress Note Due: 10th visit or 01/30/23      Evaluating Physical Therapist: Georgeanna Lea, PT, DPT   PT diagnosis/Reason for Referral:   Next Scheduled Physician Appointment: bilat shoulder pain  Allergies/Contraindications: n/a  Access Code: [email or web address]         Subjective: Patient reports on Fri or Sat 4/26 or 4/27 she began noting intermittent feelings of "ant crawling on her R wrist and FA." She states yesterday that same feeling began occurring in proximal RUE and the neck. Reports this morning mild feelings of ant crawling in UT region. She denies pain, but describes it more of that annoying feeling as if a small bug is crawling on her arm. Upon arrival to therapy no c/o of this type of feeling.     Objective: treatment delivered as noted below.    Measured ROM: did not re-assess, refer to eval  EXERCISE/ACTIVITY NAME REPETITIONS RESISTANCE COMPLETED THIS DOS   Kinesio tape: bilat shoulders  --GH joint mechanical correction  --L: UT and supra inhibitory  --R UT inhibitory     y   Exercises  - Seated Upper Trapezius Stretch  - 2-3 x daily - 7 x weekly - 5 reps - 10s hold  - Seated Shoulder Row with Anchored Resistance  - 1-2 x daily - 7 x weekly - 3 sets - 12 reps  - Sidelying Shoulder External Rotation  - 1-2 x daily - 7 x weekly - 3 sets - 8 reps  - Standing Isometric Shoulder Internal Rotation at Doorway  - 1-2 x daily - 7 x weekly - 2 sets - 5 reps - 5s hold  - Seated Shoulder Flexion AAROM with Pulley Behind  - 2-3 x daily - 7 x weekly - 12 reps - 2s hold  - Seated Shoulder Scaption AAROM with Pulley at Side  - 2-3 x daily - 7 x weekly  - 12 reps - 2s hold     HEP given at eval 01/02/23   Supine horizontal abd  Supine unilateral extension  Supine unilateral scaption   X15 ea  X15 ea  X9 ea Yellow Tband  Yellow Tband  Yellow Tband Y  Y  y                                               Assessment: During kinesio taping patient has occurrences where she rubbed R FA, these symptoms could possibly in nerve related. Worked in supine with scapular strengthening. Patient reports she will be OOT next week on vacation, she was instructed to continue with HEP given at eval.     Plan:  Assess if she has continues to experience feeling of ants crawling on R UT or the extremity.     Total Session Time 35, Timed code minutes 35, and Untimed code minutes 0  Therex x35 min (time includes taping)      Tarri Abernethy, PTA  01/07/2023,  09:57

## 2023-01-08 ENCOUNTER — Other Ambulatory Visit: Payer: Self-pay

## 2023-01-08 ENCOUNTER — Ambulatory Visit (HOSPITAL_COMMUNITY): Payer: Self-pay

## 2023-01-09 ENCOUNTER — Ambulatory Visit (HOSPITAL_COMMUNITY): Payer: Self-pay

## 2023-01-09 ENCOUNTER — Ambulatory Visit
Admission: RE | Admit: 2023-01-09 | Discharge: 2023-01-09 | Disposition: A | Discharge status: Still In Hospital | Payer: Medicare Other | Source: Ambulatory Visit

## 2023-01-09 ENCOUNTER — Other Ambulatory Visit: Payer: Self-pay

## 2023-01-09 NOTE — PT Treatment (Addendum)
[facility] Medicine [facility]  Outpatient Physical Therapy  [address]  [place], [address]  [phone]  (Fax) [phone]    Physical Therapy Treatment Note    Date: 01/09/2023  Patient's Name: Dawn Riley  Date of Birth: August 24, 1956  Physical Therapy Visit            Visit #/POC: 3/12  Authorization:med nec  POC Signed?: no  POC Ends: 02/20/23  Next Progress Note Due: 10th visit or 01/30/23      Evaluating Physical Therapist: Georgeanna Lea, PT, DPT   PT diagnosis/Reason for Referral:   Next Scheduled Physician Appointment: bilat shoulder pain  Allergies/Contraindications: n/a  Access Code: [email or web address]         Subjective: Patient reports less feeling of ants crawling with taping last visit.    Objective: treatment delivered as noted below.    Measured ROM: did not re-assess, refer to eval  EXERCISE/ACTIVITY NAME REPETITIONS RESISTANCE COMPLETED THIS DOS   Kinesio tape: bilat shoulders  --GH joint mechanical correction  --L: UT and supra inhibitory  --R UT inhibitory     n   Exercises  - Seated Upper Trapezius Stretch  - 2-3 x daily - 7 x weekly - 5 reps - 10s hold  - Seated Shoulder Row with Anchored Resistance  - 1-2 x daily - 7 x weekly - 3 sets - 12 reps  - Sidelying Shoulder External Rotation  - 1-2 x daily - 7 x weekly - 3 sets - 8 reps  - Standing Isometric Shoulder Internal Rotation at Doorway  - 1-2 x daily - 7 x weekly - 2 sets - 5 reps - 5s hold  - Seated Shoulder Flexion AAROM with Pulley Behind  - 2-3 x daily - 7 x weekly - 12 reps - 2s hold  - Seated Shoulder Scaption AAROM with Pulley at Side  - 2-3 x daily - 7 x weekly - 12 reps - 2s hold     HEP given at eval 01/02/23   Supine horizontal abd  Supine unilateral extension  Supine unilateral scaption   X15 ea  X15 ea  X9 ea Yellow Tband  Yellow Tband  Yellow Tband n  n  n   Seated lat pull   x16 20# y   Single arm row X16 ea 10# y   Wall push ups: WBOS, NBOS, triangle X5 ea  y   MFR bilat UT and periscap X5 min Roller ball y   Row  machine 2x16 20# y   Sidelying ER, abd, flexion X16, x10, x10 AROM only y       Assessment: Worked with focus on scapular/postural strengthening.     Short term goals (3 weeks):  -Patient will achieve a resting pain level of no greater than 3/10 in the bilateral shoulders.  -Patient will be able to reach L shoulder end ROM for all 5 motions measured and without an onset of pain greater than 4/10.  -Patient will achieve a global L shoulder MMT of at least 4 and without an onset of pain greater than 4/10.  -Patient will be independent with her HEP.        Long term goals (6 weeks):  -Patient will achieve a resting pain level of no greater than 1/10 in the bilateral shoulders.  -Patient will be able to reach L shoulder end ROM for all 5 motions measured and without an onset of pain greater than 2/10.  -Patient will achieve a global L shoulder  MMT of at least 4 and without an onset of pain greater than 2/10.  -Patient will be independent with her final HEP.    Plan:  Patient will be OOT next week. Assess response to machine weights this visit.     Total Session Time 35, Timed code minutes 35, and Untimed code minutes 0  Therex x35 min (time includes taping)    Tarri Abernethy, PTA  01/09/2023 16:23

## 2023-01-11 ENCOUNTER — Ambulatory Visit (HOSPITAL_COMMUNITY): Payer: Self-pay

## 2023-01-21 ENCOUNTER — Ambulatory Visit (HOSPITAL_COMMUNITY): Payer: Self-pay

## 2023-01-22 ENCOUNTER — Ambulatory Visit (HOSPITAL_COMMUNITY)
Admission: RE | Admit: 2023-01-22 | Discharge: 2023-01-22 | Disposition: A | Discharge status: Still In Hospital | Payer: Medicare Other | Source: Ambulatory Visit

## 2023-01-22 DIAGNOSIS — M25512 Pain in left shoulder: Secondary | ICD-10-CM | POA: Insufficient documentation

## 2023-01-22 DIAGNOSIS — M25511 Pain in right shoulder: Secondary | ICD-10-CM | POA: Insufficient documentation

## 2023-01-22 DIAGNOSIS — M546 Pain in thoracic spine: Secondary | ICD-10-CM | POA: Insufficient documentation

## 2023-01-22 NOTE — PT Treatment (Signed)
[facility]  Outpatient Physical Therapy  [address]  [place], [address]  [phone]  (Fax) [phone]    Physical Therapy Treatment Note    Date: 01/22/2023  Patient's Name: Dawn Riley  Date of Birth: Oct 07, 1955  Physical Therapy Visit    Visit #/POC: 4/12  Authorization:med nec  POC Signed?: no  POC Ends: 02/20/23  Next Progress Note Due: 10th visit or 01/30/23     Evaluating Physical Therapist: Georgeanna Lea, PT, DPT              PT diagnosis/Reason for Referral:   Next Scheduled Physician Appointment: bilat shoulder pain  Allergies/Contraindications: n/a  Access Code: [email or web address]      Subjective: Patient reports pain level of 5/10 at the beginning of the session. She reports improvements in her pain levels with the application of KT tape which returns to normal levels immediately after removing the tape.      Objective: treatment delivered as noted below.     Measured ROM: did not re-assess, refer to eval  EXERCISE/ACTIVITY NAME   REPETITIONS RESISTANCE COMPLETED THIS DOS   Kinesio tape: bilat shoulders  --GH joint mechanical correction  --L: UT and supra inhibitory  --R UT inhibitory        n   Exercises  - Seated Upper Trapezius Stretch  - 2-3 x daily - 7 x weekly - 5 reps - 10s hold  - Seated Shoulder Row with Anchored Resistance  - 1-2 x daily - 7 x weekly - 3 sets - 12 reps  - Sidelying Shoulder External Rotation  - 1-2 x daily - 7 x weekly - 3 sets - 8 reps  - Standing Isometric Shoulder Internal Rotation at Doorway  - 1-2 x daily - 7 x weekly - 2 sets - 5 reps - 5s hold  - Seated Shoulder Flexion AAROM with Pulley Behind  - 2-3 x daily - 7 x weekly - 12 reps - 2s hold  - Seated Shoulder Scaption AAROM with Pulley at Side  - 2-3 x daily - 7 x weekly - 12 reps - 2s hold        HEP given at eval 01/02/23   NuStep X77min(UE and LE) lvl3 Y   Supine horizontal abd  Supine unilateral extension  Supine unilateral scaption    X15 ea  X15 ea  X9 ea Yellow Tband  Yellow  Tband  Yellow Tband n  n  n   Seated lat pull    3x8 30# Y   Single arm row X16 ea 10# n   Wall push ups: WBOS, NBOS, triangle X5 ea   n   MFR bilat UT and periscap X5 min Roller ball n   Row machine 2x16 20# n   Sidelying ER, abd, flexion X16, x10, x10 AROM only n   Banded Shoulder IR  Banded Shoulder ER 2x14  2x10 Yellow TB  Yellow TB Y  Y         Assessment: Patient tolerated treatment well during today's session and light resistance for both ER and IR was trialed. Good maintenance of form after initial demonstration was given for each exercise.     Short term goals (3 weeks):  -Patient will achieve a resting pain level of no greater than 3/10 in the bilateral shoulders.  -Patient will be able to reach L shoulder end ROM for all 5 motions measured and without an onset of pain greater than 4/10.  -Patient  will achieve a global L shoulder MMT of at least 4 and without an onset of pain greater than 4/10.  -Patient will be independent with her HEP.        Long term goals (6 weeks):  -Patient will achieve a resting pain level of no greater than 1/10 in the bilateral shoulders.  -Patient will be able to reach L shoulder end ROM for all 5 motions measured and without an onset of pain greater than 2/10.  -Patient will achieve a global L shoulder MMT of at least 4 and without an onset of pain greater than 2/10.  -Patient will be independent with her final HEP.     Plan:  Continue with low resistance strengthening and based on MRI results.    Total Session Time 34  THERAPEUTIC EXERCISE 34 minutes      Creig Hines, PT  01/22/2023, 16:29

## 2023-01-23 ENCOUNTER — Ambulatory Visit (HOSPITAL_COMMUNITY)
Admission: RE | Admit: 2023-01-23 | Discharge: 2023-01-23 | Disposition: A | Discharge status: Still In Hospital | Payer: Medicare Other | Source: Ambulatory Visit

## 2023-01-23 NOTE — PT Treatment (Signed)
[facility]  Outpatient Physical Therapy  [address]  [place] [address]  [phone]  (Fax) [phone]    Physical Therapy Treatment Note    Date: 01/23/2023  Patient's Name: Dawn Riley  Date of Birth: [birth year]  Physical Therapy Visit      Visit #/POC: 5/12  Authorization:med nec  POC Signed?: no  POC Ends: 02/20/23  Next Progress Note Due: 10th visit or 01/30/23     Evaluating Physical Therapist: Georgeanna Lea, PT, DPT              PT diagnosis/Reason for Referral:   Next Scheduled Physician Appointment: bilat shoulder pain  Allergies/Contraindications: n/a  Access Code: [email or web address]      Subjective: Patient reports she had a massage this morning and it really helped her pain. Following the massage she rates her pain 2/10. States she continues to have weakness in her shoulders.      Objective: treatment delivered as noted below.     Measured ROM: did not re-assess, refer to eval  EXERCISE/ACTIVITY NAME    REPETITIONS RESISTANCE COMPLETED THIS DOS   Kinesio tape: bilat shoulders  --GH joint mechanical correction  --L: UT and supra inhibitory  --R UT inhibitory        n   Exercises  - Seated Upper Trapezius Stretch  - 2-3 x daily - 7 x weekly - 5 reps - 10s hold  - Seated Shoulder Row with Anchored Resistance  - 1-2 x daily - 7 x weekly - 3 sets - 12 reps  - Sidelying Shoulder External Rotation  - 1-2 x daily - 7 x weekly - 3 sets - 8 reps  - Standing Isometric Shoulder Internal Rotation at Doorway  - 1-2 x daily - 7 x weekly - 2 sets - 5 reps - 5s hold  - Seated Shoulder Flexion AAROM with Pulley Behind  - 2-3 x daily - 7 x weekly - 12 reps - 2s hold  - Seated Shoulder Scaption AAROM with Pulley at Side  - 2-3 x daily - 7 x weekly - 12 reps - 2s hold        HEP given at eval 01/02/23   NuStep  UBE X58min(UE and LE)  6 min lvl3 N  Y   Supine horizontal abd  Supine unilateral extension  Supine unilateral scaption    X15 ea  X15 ea  X9 ea Yellow Tband  Yellow Tband  Yellow  Tband n  n  n   Seated lat pull    3x8 30# Y   Single arm row X16 ea 10# n   Wall push ups: WBOS, NBOS, triangle X5 ea   n   MFR bilat UT and periscap X5 min Roller ball n   Row machine 2x16 30# y   Sidelying ER, abd, flexion X16, x10, x10 AROM only n   Banded Shoulder IR  Banded Shoulder ER 2x14  2x10 Yellow TB  Yellow TB Y  Y         Assessment: Patient demonstrated good form with IR/ER. UT activation noted but she does self correct.      Short term goals (3 weeks):  -Patient will achieve a resting pain level of no greater than 3/10 in the bilateral shoulders.  -Patient will be able to reach L shoulder end ROM for all 5 motions measured and without an onset of pain greater than 4/10.  -Patient will achieve a global L shoulder MMT of  at least 4 and without an onset of pain greater than 4/10.  -Patient will be independent with her HEP.        Long term goals (6 weeks):  -Patient will achieve a resting pain level of no greater than 1/10 in the bilateral shoulders.  -Patient will be able to reach L shoulder end ROM for all 5 motions measured and without an onset of pain greater than 2/10.  -Patient will achieve a global L shoulder MMT of at least 4 and without an onset of pain greater than 2/10.  -Patient will be independent with her final HEP.     Plan:  Continue with low resistance strengthening and based on MRI results.           Total Session Time 30 and Timed code minutes 30  THERAPEUTIC EXERCISE 30 minutes      Trilby Leaver, PTA  01/23/2023, 16:36

## 2023-01-24 ENCOUNTER — Ambulatory Visit (HOSPITAL_COMMUNITY): Payer: Self-pay

## 2023-01-29 ENCOUNTER — Ambulatory Visit (HOSPITAL_COMMUNITY)
Admission: RE | Admit: 2023-01-29 | Discharge: 2023-01-29 | Disposition: A | Discharge status: Still In Hospital | Payer: Medicare Other | Source: Ambulatory Visit

## 2023-01-29 NOTE — PT Treatment (Signed)
[facility]  Outpatient Physical Therapy  [address]  [place], [address]  [phone]  (Fax) [phone]    Physical Therapy Treatment Note    Date: 01/29/2023  Patient's Name: Dawn Riley  Date of Birth: [birth year]  Physical Therapy Visit        Visit #/POC: 6/12  Authorization:med nec  POC Signed?: no  POC Ends: 02/20/23  Next Progress Note Due: 10th visit or 01/30/23     Evaluating Physical Therapist: Georgeanna Lea, PT, DPT              PT diagnosis/Reason for Referral:   Next Scheduled Physician Appointment: bilat shoulder pain  Allergies/Contraindications: n/a  Access Code: [email or web address]      Subjective: Patient reports she is having pain in her shoulders this morning (L)>(R). Rates it 3/10      Objective: treatment delivered as noted below.     Measured ROM: did not re-assess, refer to eval  EXERCISE/ACTIVITY NAME    REPETITIONS RESISTANCE COMPLETED THIS DOS   Kinesio tape: bilat shoulders  --GH joint mechanical correction  --L: UT and supra inhibitory  --R UT inhibitory        n   Exercises  - Seated Upper Trapezius Stretch  - 2-3 x daily - 7 x weekly - 5 reps - 10s hold  - Seated Shoulder Row with Anchored Resistance  - 1-2 x daily - 7 x weekly - 3 sets - 12 reps  - Sidelying Shoulder External Rotation  - 1-2 x daily - 7 x weekly - 3 sets - 8 reps  - Standing Isometric Shoulder Internal Rotation at Doorway  - 1-2 x daily - 7 x weekly - 2 sets - 5 reps - 5s hold  - Seated Shoulder Flexion AAROM with Pulley Behind  - 2-3 x daily - 7 x weekly - 12 reps - 2s hold  - Seated Shoulder Scaption AAROM with Pulley at Side  - 2-3 x daily - 7 x weekly - 12 reps - 2s hold        HEP given at eval 01/02/23   NuStep  UBE X19min(UE and LE)  7 min lvl3 N  Y   Supine horizontal abd  Supine unilateral extension  Supine unilateral scaption    X15 ea  X15 ea  X9 ea Yellow Tband  Yellow Tband  Yellow Tband n  n  n   Seated lat pull    3x8 30# Y   Single arm row X16 ea 10# n   Wall push ups:  WBOS, NBOS, triangle X5 ea   Y WBOS    MFR bilat UT and periscap X5 min Roller ball n   Row machine 2x16 30# y   Sidelying ER, abd, flexion X16, x10, x10 AROM only n   Banded Shoulder IR  Banded Shoulder ER 2x14  2x10 Red TB  Red TB Y  Y   Over head ER banded seated x8 YTB y         Assessment: Patient required VC with proper technique with lat pulls. Tolerated overhead ER in seated position. Attempted to perform supine, but she refused to lay supine.      Short term goals (3 weeks):  -Patient will achieve a resting pain level of no greater than 3/10 in the bilateral shoulders.  -Patient will be able to reach L shoulder end ROM for all 5 motions measured and without an onset of pain greater than 4/10.  -  Patient will achieve a global L shoulder MMT of at least 4 and without an onset of pain greater than 4/10.  -Patient will be independent with her HEP.        Long term goals (6 weeks):  -Patient will achieve a resting pain level of no greater than 1/10 in the bilateral shoulders.  -Patient will be able to reach L shoulder end ROM for all 5 motions measured and without an onset of pain greater than 2/10.  -Patient will achieve a global L shoulder MMT of at least 4 and without an onset of pain greater than 2/10.  -Patient will be independent with her final HEP.     Plan:  Continue with low resistance strengthening and based on MRI results.              Total Session Time 38 and Timed code minutes 38  THERAPEUTIC EXERCISE 38 minutes      Trilby Leaver, PTA  01/29/2023, 08:59

## 2023-01-31 ENCOUNTER — Ambulatory Visit (INDEPENDENT_AMBULATORY_CARE_PROVIDER_SITE_OTHER): Payer: Medicare Other | Admitting: OTOLARYNGOLOGY

## 2023-01-31 ENCOUNTER — Other Ambulatory Visit: Payer: Self-pay

## 2023-01-31 ENCOUNTER — Encounter (INDEPENDENT_AMBULATORY_CARE_PROVIDER_SITE_OTHER): Payer: Self-pay | Admitting: OTOLARYNGOLOGY

## 2023-01-31 ENCOUNTER — Ambulatory Visit (HOSPITAL_COMMUNITY)
Admission: RE | Admit: 2023-01-31 | Discharge: 2023-01-31 | Disposition: A | Discharge status: Still In Hospital | Payer: Medicare Other | Source: Ambulatory Visit

## 2023-01-31 VITALS — Ht 64.0 in | Wt 240.0 lb

## 2023-01-31 DIAGNOSIS — R42 Dizziness and giddiness: Secondary | ICD-10-CM

## 2023-01-31 DIAGNOSIS — H6993 Unspecified Eustachian tube disorder, bilateral: Secondary | ICD-10-CM

## 2023-01-31 DIAGNOSIS — H9313 Tinnitus, bilateral: Secondary | ICD-10-CM

## 2023-01-31 DIAGNOSIS — H6123 Impacted cerumen, bilateral: Secondary | ICD-10-CM

## 2023-01-31 DIAGNOSIS — J309 Allergic rhinitis, unspecified: Secondary | ICD-10-CM

## 2023-01-31 NOTE — H&P (Signed)
[facility]  [address]  [place] [address]    Return Patient Visit    Name: Dawn Riley MRN:  [id number]   Date: 01/31/2023 DOB: [birth year] (67 y.o.)       Referring Provider:  No ref. provider found    Reason for Visit:   Chief Complaint   Patient presents with    Follow-up After Testing     Rc after BPPV screen.        History of Present Illness:  Dawn Riley is a 66 y.o. female who is FU on dizziness and AR. Her dizziness is much improved. She mainly notices it now when standing up from sitting position and it helps to go more slowly. Taking Flonase and Claritin     VNG was negative for labyrinthine dysfunction. Pt was treated by a physical therapist prior to   Patient History:  Patient Active Problem List   Diagnosis    Anxiety    Arthritis of carpometacarpal [facility]) joint of right thumb    Atrial fibrillation (CMS HCC)    Attention deficit hyperactivity disorder (ADHD)    Carpal tunnel syndrome of left wrist    Chest pain    Chronic pain of both shoulders    DDD (degenerative disc disease), lumbar    Disorder of bone density and structure, unspecified    HLD (hyperlipidemia)    Hyperglycemia    Impingement syndrome of shoulder region    Morton metatarsalgia    Neck pain    Nontraumatic complete tear of left rotator cuff    Obesity    OSA (obstructive sleep apnea)    Ovarian cyst    Osteoarthritis of spine with radiculopathy, cervical region    Palpitations    Postmenopause    Primary osteoarthritis of both knees    PVCs (premature ventricular contractions)    Rotator cuff tear, right    Status post total left knee replacement using cement    Tachycardia    Tear of left supraspinatus tendon    Mitral valve regurgitation    Bilateral shoulder pain    Thoracic back pain    Osteoarthritis     Current Outpatient Medications   Medication Sig    azelastine (ASTELIN) 137 mcg (0.1 %) Nasal Aerosol, Spray Administer 2 Sprays into each nostril Every evening Use in each nostril as directed     fluticasone propionate (FLONASE) 50 mcg/actuation Nasal Spray, Suspension     loratadine (CLARITIN) 5 mg/5 mL Oral Solution Take 10 mL (10 mg total) by mouth    LORazepam (ATIVAN) 0.5 mg Oral Tablet lorazepam 0.5 mg tablet   TAKE 1 TABLET BY MOUTH ONCE DAILY      Allergies   Allergen Reactions    Sulfamethoxazole  Other Adverse Reaction (Add comment)     Patient had numbness in her extremities    Azelastine Mental Status Effect     Felt anxious and could not sleep.     History reviewed. No pertinent past medical history.  History reviewed. No pertinent surgical history.  Family Medical History:    None         Social History     Tobacco Use    Smoking status: Never    Smokeless tobacco: Never       Review of Systems:  Review of Systems    Physical Exam:  Ht 1.626 m (5\' 4" )   Wt 109 kg (240 lb)   BMI 41.20 kg/m  ENT Physical Exam  Constitutional  Appearance: patient appears well-developed, well-nourished and well-groomed,  Communication/Voice: communication appropriate for developmental age; vocal quality normal;  Head and Face  Appearance: head appears normal, face appears normal and face appears atraumatic;  Palpation: facial palpation normal;  Salivary: glands normal;  Ear  Hearing: intact;  Auricles: right auricle normal; left auricle normal;  External Mastoids: right external mastoid normal; left external mastoid normal;  Ear Canals: bilateral ear canals impacted cerumen observed;  Tympanic Membranes: right tympanic membrane normal; left tympanic membrane normal;  Nose  External Nose: nares patent bilaterally; external nose normal;  Internal Nose: bilateral intranasal mucosa edematous; nasal septal deviation present; bilateral inferior turbinates with hypertrophy;  Oral Cavity/Oropharynx  Lips: normal;  Teeth: normal;  Gums: gingiva normal;  Tongue: normal;  Oral mucosa: normal;  Hard palate: normal;  Neck  Neck: neck normal; neck palpation normal;  Thyroid: thyroid normal;  Respiratory  Inspection:  breathing unlabored; normal breathing rate;  Lymphatic  Palpation: lymph nodes normal;  Neurovestibular  Mental Status: alert and oriented;  Psychiatric: mood normal; affect is appropriate;  Cranial Nerves: cranial nerves intact;       Assessment:  ENCOUNTER DIAGNOSES     ICD-10-CM   1. 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 ETD (Eustachian tube dysfunction), bilateral  H69.93   3. Chronic allergic rhinitis  J30.9   4. Bilateral impacted cerumen  H61.23   5. Dizziness and giddiness  R42       Plan:  Medical records reviewed on 01/31/2023.  Reviiewed VNG results. AU cerumen debrided. Cont Flonase and zyrtec. Dizziness does not appear be otologic. Recommend following up with PCP and cardiologist.     Orders Placed This Encounter    [phone] - REMOVAL IMPACTED CERUMEN W/ INSTRUMENT, UNILATERAL (AMB ONLY-PD)     Return in about 6 months (around 08/03/2023).    Marcelline Deist, PA-C  The advanced practice clinician's documentation was reviewed/amended in its entirety with the assessment and plan portion completely performed independently by me during this separate encounter.

## 2023-01-31 NOTE — Progress Notes (Signed)
[facility]  Outpatient Physical Therapy  [address]  [place], [address]  [phone]  (Fax) [phone]    Physical Therapy Progress Note    Date: 01/31/2023  Patient's Name: Dawn Riley  Date of Birth: [birth year]  Physical Therapy Progress Note     Visit #/POC: 7/12  Authorization:med nec  POC Signed?: no  POC Ends: 02/20/23  Next Progress Note Due: 10th visit or 01/30/23     Evaluating Physical Therapist: Georgeanna Lea, PT, DPT              PT diagnosis/Reason for Referral:   Next Scheduled Physician Appointment: bilat shoulder pain  Allergies/Contraindications: n/a  Access Code: [id number]      Subjective: Patient rates her current pain level in the R shoulder is a 5.5/10 and a 6/10 on the L. She has been sleeping better at night and has noticed an improvement in strength level at home.      Patient-Specific Functional Score:     Problem Score   1. Washing the dishes 8   2. Dressing 5   3. Dusting/Vacuuming  5   TOTAL 6         OBJECTIVE     Shoulder AROM    right left   Flexion 130 120   Extension 38 45   Abduction 126 131   ER 87 71   IR 44 36        Strength (defined as __/5 based on 0/5 - 5/5 grading system)       right left   Shoulder flexion 4+ 4+   Shoulder abduction  4- 4   Shoulder extension 4+ 4+   Shoulder IR 4 5   Shoulder ER 4+ 5      EXERCISE/ACTIVITY NAME    REPETITIONS RESISTANCE COMPLETED THIS DOS   Kinesio tape: bilat shoulders  --GH joint mechanical correction  --L: UT and supra inhibitory  --R UT inhibitory        n   Exercises  - Seated Upper Trapezius Stretch  - 2-3 x daily - 7 x weekly - 5 reps - 10s hold  - Seated Shoulder Row with Anchored Resistance  - 1-2 x daily - 7 x weekly - 3 sets - 12 reps  - Sidelying Shoulder External Rotation  - 1-2 x daily - 7 x weekly - 3 sets - 8 reps  - Standing Isometric Shoulder Internal Rotation at Doorway  - 1-2 x daily - 7 x weekly - 2 sets - 5 reps - 5s hold  - Seated Shoulder Flexion AAROM with Pulley Behind   - 2-3 x daily - 7 x weekly - 12 reps - 2s hold  - Seated Shoulder Scaption AAROM with Pulley at Side  - 2-3 x daily - 7 x weekly - 12 reps - 2s hold        HEP given at eval 01/02/23   NuStep  UBE X23min(UE and LE)  7 min lvl3 N  Y   Supine horizontal abd  Supine unilateral extension  Supine unilateral scaption    X15 ea  X15 ea  X9 ea Yellow Tband  Yellow Tband  Yellow Tband n  n  n   Seated lat pull    3x8 30# Y   Single arm row X16 ea 10# n   Wall push ups: WBOS, NBOS, triangle X5 ea   Y WBOS    MFR bilat UT and periscap X5 min Roller  ball n   Row machine 2x16 30# y   Sidelying ER, abd, flexion X16, x10, x10 AROM only n   Banded Shoulder IR  Banded Shoulder ER 2x14  2x10 Red TB  Red TB Y  Y   Over head ER banded seated x8 YTB y      Additions to HEP: (WATER based)  Exercises  - Standing Shoulder Horizontal Abduction - Palms Down  - 1 x daily - 3-4 x weekly - 3 sets - 15 reps  - Standing Shoulder Flexion to 90 Degrees  - 1 x daily - 3-4 x weekly - 3 sets - 12 reps  - Shoulder Abduction - Thumbs Up  - 1 x daily - 3-4 x weekly - 3 sets - 12 reps  - Shoulder External Rotation and Scapular Retraction  - 1 x daily - 3-4 x weekly - 3 sets - 15 reps     Assessment: Patient has made some significant improvements in certain movements of the shoulders such as abduction bilaterally, R ER, R IR, and R flexion AROM. Strength has also demonstrated improvements, but these are slight. Level of function at home has improved as well based on her PSFS ratings. She does still seek out alternative methods of treatment and these are a frequent topic of discussion during our sessions. For this session her line of questioning was regarding water therapy and whether there may be any benefit for her shoulders. Rather than have her try formal water therapy, I did give her a list of 4 shoulder motions to be completed in the therapy pool of the local rec center and shoulder this prove beneficial then we may pursue that as another route of  rehabilitation.      Short term goals (3 weeks):  -Patient will achieve a resting pain level of no greater than 3/10 in the bilateral shoulders. (Progressing 01/31/2023)  -Patient will be able to reach L shoulder end ROM for all 5 motions measured and without an onset of pain greater than 4/10.  (Progressing 01/31/2023)  -Patient will achieve a global L shoulder MMT of at least 4 and without an onset of pain greater than 4/10. (Partially met 01/31/2023)  -Patient will be independent with her HEP.  (Partially met 01/31/2023)        Long term goals (6 weeks):  -Patient will achieve a resting pain level of no greater than 1/10 in the bilateral shoulders.  -Patient will be able to reach L shoulder end ROM for all 5 motions measured and without an onset of pain greater than 2/10.  -Patient will achieve a global L shoulder MMT of at least 4 and without an onset of pain greater than 2/10.  -Patient will be independent with her final HEP.     Plan:  Continue at same frequency of 2x/week for remaining visits until end of POC on 02/20/2023. Continue with low resistance strengthening and based on MRI results.    Total Session Time 42  THERAPEUTIC EXERCISE 42 minutes      Creig Hines, PT  01/31/2023, 15:35

## 2023-01-31 NOTE — Procedures (Signed)
[facility]  [address]  [place] [address]    Procedure Note    Name: Dawn Riley MRN:  [id number]   Date: 01/31/2023 DOB:  11/07/1955 (67 y.o.)         [phone] - REMOVAL IMPACTED CERUMEN W/ INSTRUMENT, UNILATERAL (AMB ONLY-PD)    Performed by: Conchita Paris, DO  Authorized by: Conchita Paris, DO    Time Out:     Immediately before the procedure, a time out was called:  Yes    Patient verified:  Yes    Procedure Verified:  Yes    Site Verified:  Yes  Documentation:      Procedure: Cerumen cleaning  Pre-op Dx: Cerumen impaction      Bilateral EAC(s) examined under binocular microscopy.  Cerumen and/or debris was cleaned from the canal(s) using curettes, suction, and alligator forceps.  Patient tolerated procedure well.        Conchita Paris, DO

## 2023-02-12 ENCOUNTER — Ambulatory Visit (HOSPITAL_COMMUNITY)
Admission: RE | Admit: 2023-02-12 | Discharge: 2023-02-12 | Disposition: A | Discharge status: Still In Hospital | Payer: Medicare Other | Source: Ambulatory Visit

## 2023-02-12 NOTE — PT Treatment (Signed)
[facility]  Outpatient Physical Therapy  [address]  [place], [address]  [phone]  (Fax) [phone]    Physical Therapy Treatment Note    Date: 02/12/2023  Patient's Name: Dawn Riley  Date of Birth: 21-Feb-1956  Physical Therapy Visit    Visit #/POC: 8/12  Authorization:med nec  POC Signed?: no  POC Ends: 02/20/23  Next Progress Note Due: 10th visit or 01/30/23     Evaluating Physical Therapist: Georgeanna Lea, PT, DPT              PT diagnosis/Reason for Referral:   Next Scheduled Physician Appointment: bilat shoulder pain  Allergies/Contraindications: MRI of the R and L shoulders obtained on 06/25/2022.   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 Impingement of the supraspinatus musculotendinous junction.  -L shoulder indications are a partial tear of the supraspinatus tendon and possibility bursitis of the subscapularis.  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      OBJECTIVE        EXERCISE/ACTIVITY NAME    REPETITIONS RESISTANCE COMPLETED THIS DOS   Kinesio tape: bilat shoulders  --GH joint mechanical correction  --L: UT and supra inhibitory  --R UT inhibitory        n   Exercises  - Seated Upper Trapezius Stretch  - 2-3 x daily - 7 x weekly - 5 reps - 10s hold  - Seated Shoulder Row with Anchored Resistance  - 1-2 x daily - 7 x weekly - 3 sets - 12 reps  - Sidelying Shoulder External Rotation  - 1-2 x daily - 7 x weekly - 3 sets - 8 reps  - Standing Isometric Shoulder Internal Rotation at Doorway  - 1-2 x daily - 7 x weekly - 2 sets - 5 reps - 5s hold  - Seated Shoulder Flexion AAROM with Pulley Behind  - 2-3 x daily - 7 x weekly - 12 reps - 2s hold  - Seated Shoulder Scaption AAROM with  Pulley at Side  - 2-3 x daily - 7 x weekly - 12 reps - 2s hold        HEP given at eval 01/02/23   NuStep  UBE X51min(UE and LE)  7 min lvl3 N  Y   Supine horizontal abd  Supine unilateral extension  Supine unilateral scaption    X15 ea  X15 ea  X9 ea Yellow Tband  Yellow Tband  Yellow Tband n  n  n   Seated lat pull    3x8 30# N   Single arm row X16 ea 10# n   Wall push ups: WBOS, NBOS, triangle X5 ea   N WBOS    MFR bilat UT and periscap X5 min Roller ball n   Row machine 3x10 30# y   Sidelying ER  Sidelying  Abd  Sidelying  flexion 3x12, 1x8 NW, 1lbs Y  N  N   Banded Shoulder IR  Banded Shoulder ER 3x10  2x10 Yellow TB  Red TB Y  N   Over head ER banded seated x8 YTB N   Chicken Wing 4x14 NW Y        Assessment:  Patient tolerated treatment well during today's session and no additions were made to the HEP as the patient has only just now received her bathing suit in order to perform the water based exercises given during the previous session.     Short term goals (3 weeks):  -Patient will achieve a resting pain level of no greater than 3/10 in the bilateral shoulders. (Progressing 01/31/2023)  -Patient will be able to reach L shoulder end ROM for all 5 motions measured and without an onset of pain greater than 4/10.  (Progressing 01/31/2023)  -Patient will achieve a global L shoulder MMT of at least 4 and without an onset of pain greater than 4/10. (Partially met 01/31/2023)  -Patient will be independent with her HEP.  (Partially met 01/31/2023)        Long term goals (6 weeks):  -Patient will achieve a resting pain level of no greater than 1/10 in the bilateral shoulders.  -Patient will be able to reach L shoulder end ROM for all 5 motions measured and without an onset of pain greater than 2/10.  -Patient will achieve a global L shoulder MMT of at least 4 and without an onset of pain greater than 2/10.  -Patient will be independent with her final HEP.     Plan:  Continue at same frequency of 2x/week for remaining  visits until end of POC on 02/20/2023. Continue with low resistance strengthening and based on MRI results.    Total Session Time 37  THERAPEUTIC EXERCISE 37 minutes      Creig Hines, PT  02/12/2023, 15:01

## 2023-02-13 ENCOUNTER — Ambulatory Visit (HOSPITAL_COMMUNITY)
Admission: RE | Admit: 2023-02-13 | Discharge: 2023-02-13 | Disposition: A | Discharge status: Still In Hospital | Payer: Medicare Other | Source: Ambulatory Visit

## 2023-02-13 DIAGNOSIS — T8089XA Other complications following infusion, transfusion and therapeutic injection, initial encounter: Secondary | ICD-10-CM | POA: Insufficient documentation

## 2023-02-13 NOTE — PT Treatment (Signed)
[facility]  Outpatient Physical Therapy  [address]  [place], [address]  [phone]  (Fax) [phone]    Physical Therapy Treatment Note    Date: 02/13/2023  Patient's Name: Dawn Riley  Date of Birth: 04-17-1956  Physical Therapy Visit        Visit #/POC: 9/12  Authorization:med nec  POC Signed?: no  POC Ends: 02/20/23  Next Progress Note Due: 10th visit or 01/30/23     Evaluating Physical Therapist: Georgeanna Lea, PT, DPT              PT diagnosis/Reason for Referral:   Next Scheduled Physician Appointment: bilat shoulder pain  Allergies/Contraindications: MRI of the R and L shoulders obtained on 06/25/2022.   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 Impingement of the supraspinatus musculotendinous junction.  -L shoulder indications are a partial tear of the supraspinatus tendon and possibility bursitis of the subscapularis.  Access Code: [email or web address]      Subjective: Patient reports she had a little discomfort yesterday in (B) shoulders following her last visit.  States that  today it is better.     OBJECTIVE        EXERCISE/ACTIVITY NAME    REPETITIONS RESISTANCE COMPLETED THIS DOS   Kinesio tape: bilat shoulders  --GH joint mechanical correction  --L: UT and supra inhibitory  --R UT inhibitory        n   Exercises  - Seated Upper Trapezius Stretch  - 2-3 x daily - 7 x weekly - 5 reps - 10s hold  - Seated Shoulder Row with Anchored Resistance  - 1-2 x daily - 7 x weekly - 3 sets - 12 reps  - Sidelying Shoulder External Rotation  - 1-2 x daily - 7 x weekly - 3 sets - 8 reps  - Standing Isometric Shoulder Internal Rotation at Doorway  - 1-2 x daily - 7 x weekly - 2 sets - 5 reps - 5s hold  - Seated Shoulder Flexion AAROM with Pulley Behind  - 2-3 x daily - 7 x weekly - 12 reps - 2s hold  - Seated Shoulder Scaption AAROM with Pulley at Side  - 2-3 x daily - 7 x weekly - 12 reps  - 2s hold        HEP given at eval 01/02/23   NuStep  UBE X62min(UE and LE)  7 min lvl3 N  Y   Supine horizontal abd  Supine unilateral extension  Supine unilateral scaption    X15 ea  X15 ea  X9 ea Yellow Tband  Yellow Tband  Yellow Tband n  n  n   Seated lat pull    3x8 30# N   Single arm row X16 ea 10# n   Wall push ups: WBOS, NBOS, triangle X5 ea   N WBOS    MFR bilat UT and periscap X5 min Roller ball n   Row machine 2x15 30# y   Sidelying ER  Sidelying  Abd  Sidelying  flexion 3x12, 1x10 NW, 1lbs Y  N  N   Banded Shoulder IR  Banded Shoulder ER 3x10  2x10 Yellow TB  Red TB Y  N   Over head ER banded seated x8 YTB N   Chicken Wing 3x14, x10 NW Y         Assessment: Noted UT activation however after VC she was  able to correct. No adverse effects noted from treatment.      Short term goals (3 weeks):  -Patient will achieve a resting pain level of no greater than 3/10 in the bilateral shoulders. (Progressing 01/31/2023)  -Patient will be able to reach L shoulder end ROM for all 5 motions measured and without an onset of pain greater than 4/10.  (Progressing 01/31/2023)  -Patient will achieve a global L shoulder MMT of at least 4 and without an onset of pain greater than 4/10. (Partially met 01/31/2023)  -Patient will be independent with her HEP.  (Partially met 01/31/2023)        Long term goals (6 weeks):  -Patient will achieve a resting pain level of no greater than 1/10 in the bilateral shoulders.  -Patient will be able to reach L shoulder end ROM for all 5 motions measured and without an onset of pain greater than 2/10.  -Patient will achieve a global L shoulder MMT of at least 4 and without an onset of pain greater than 2/10.  -Patient will be independent with her final HEP.     Plan:  Continue at same frequency of 2x/week for remaining visits until end of POC on 02/20/2023. Continue with low resistance strengthening and based on MRI results.             Total Session Time 38 and Timed code minutes 38  THERAPEUTIC  EXERCISE 38 minutes      Trilby Leaver, PTA  02/13/2023, 16:21

## 2023-02-15 ENCOUNTER — Ambulatory Visit (HOSPITAL_COMMUNITY): Payer: Self-pay

## 2023-02-18 ENCOUNTER — Ambulatory Visit
Admission: RE | Admit: 2023-02-18 | Discharge: 2023-02-18 | Disposition: A | Discharge status: Still In Hospital | Payer: Medicare Other | Source: Ambulatory Visit | Attending: FAMILY PRACTICE | Admitting: FAMILY PRACTICE

## 2023-02-18 NOTE — PT Treatment (Signed)
[facility]  Outpatient Physical Therapy  [address]  [place], [address]  [phone]  (Fax) [phone]    Physical Therapy Treatment Note    Date: 02/18/2023  Patient's Name: Dawn Riley  Date of Birth: 1956-04-25  Physical Therapy Progress Note     Visit #/POC: 10/12  Authorization:med nec  POC Signed?: no  POC Ends: 02/20/23  Next Progress Note Due: 17th visit or 03/02/23     Evaluating Physical Therapist: Georgeanna Lea, PT, DPT              PT diagnosis/Reason for Referral:   Next Scheduled Physician Appointment: bilat shoulder pain  Allergies/Contraindications: n/a  Access Code: [email or web address]      Subjective: Patient rates her current pain level in the R shoulder is a 4/10 and a 3/10 on the L. 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     Objective: Higher incidence of R scapular elevation during lat pulldown.     EXERCISE/ACTIVITY NAME    REPETITIONS RESISTANCE COMPLETED THIS DOS   Kinesio tape: bilat shoulders  --GH joint mechanical correction  --L: UT and supra inhibitory  --R UT inhibitory        n   Exercises  - Seated Upper Trapezius Stretch  - 2-3 x daily - 7 x weekly - 5 reps - 10s hold  - Seated Shoulder Row with Anchored Resistance  - 1-2 x daily - 7 x weekly - 3 sets - 12 reps  - Sidelying Shoulder External Rotation  - 1-2 x daily - 7 x weekly - 3 sets - 8 reps  - Standing Isometric Shoulder Internal Rotation at Doorway  - 1-2 x daily - 7 x weekly - 2 sets - 5 reps - 5s hold  - Seated Shoulder Flexion AAROM with Pulley Behind  - 2-3 x daily - 7 x weekly - 12 reps - 2s hold  - Seated Shoulder Scaption AAROM with Pulley at Side  - 2-3 x daily - 7 x weekly - 12 reps - 2s hold        HEP given at eval 01/02/23   NuStep  UBE X22min(UE and LE)  X74min each lvl3 N  Y   Supine horizontal abd  Supine unilateral extension  Supine unilateral scaption    X15 ea  X15 ea  X9 ea Yellow  Tband  Yellow Tband  Yellow Tband n  n  n   Seated lat pull    4x8 30# Y   Single arm row X16 ea 10# n   Wall push ups: WBOS, NBOS, triangle X5 ea   n WBOS    MFR bilat UT and periscap X5 min Roller ball n   Row machine 2x16 30# Y   Sidelying ER, abd, flexion X16, x10, x10 AROM only n   Banded Shoulder IR  Banded Shoulder ER 2x14  3x10 Red TB  Yellow TB n  Y   Over head ER banded seated x8 YTB n   Shoulder Hori Abd/Add @ flex 1x10 each #3 Y   Shoulder Hori Abd/Add @ abd 1x10 each #3 Y        Assessment: Patient continues to tolerate treatment well during sessions although no significant increases in resistance have been made. Improvement in pain levels are irregular and do not appear to be directly related to duration of UE, but the specific  direction of movement. I did have a detailed discussion with the patient regarding possible supplementation during this session.     Short term goals (3 weeks):  -Patient will achieve a resting pain level of no greater than 3/10 in the bilateral shoulders. (Progressing 01/31/2023)  -Patient will be able to reach L shoulder end ROM for all 5 motions measured and without an onset of pain greater than 4/10.  (Progressing 01/31/2023)  -Patient will achieve a global L shoulder MMT of at least 4 and without an onset of pain greater than 4/10. (Partially met 01/31/2023)  -Patient will be independent with her HEP.  (Partially met 01/31/2023)        Long term goals (6 weeks):  -Patient will achieve a resting pain level of no greater than 1/10 in the bilateral shoulders.  -Patient will be able to reach L shoulder end ROM for all 5 motions measured and without an onset of pain greater than 2/10.  -Patient will achieve a global L shoulder MMT of at least 4 and without an onset of pain greater than 2/10.  -Patient will be independent with her final HEP.     Plan:  Discharge during next session assuming no major regression.    Total Session Time 44 and Untimed code minutes 7  THERAPEUTIC EXERCISE  36 minutes      Creig Hines, PT  02/18/2023, 14:03

## 2023-02-19 ENCOUNTER — Ambulatory Visit
Admission: RE | Admit: 2023-02-19 | Discharge: 2023-02-19 | Disposition: A | Discharge status: Still In Hospital | Payer: BLUE CROSS/BLUE SHIELD | Source: Ambulatory Visit | Attending: FAMILY PRACTICE | Admitting: FAMILY PRACTICE

## 2023-02-19 DIAGNOSIS — M25511 Pain in right shoulder: Secondary | ICD-10-CM | POA: Insufficient documentation

## 2023-02-19 DIAGNOSIS — M25512 Pain in left shoulder: Secondary | ICD-10-CM | POA: Insufficient documentation

## 2023-02-19 DIAGNOSIS — M546 Pain in thoracic spine: Secondary | ICD-10-CM | POA: Insufficient documentation

## 2023-02-19 NOTE — PT Treatment (Signed)
[facility]  Outpatient Physical Therapy  [address]  [place], [address]  [phone]  (Fax) [phone]    Physical Therapy Discharge Note    Date: 02/19/2023  Patient's Name: Dawn Riley  Date of Birth: [birth year]  Physical Therapy Discharge    Visit #/POC: 11/12  Authorization: med nec  POC Signed?: no  POC Ends: 02/20/23  Next Progress Note Due: 17th visit or 02/20/23     Evaluating Physical Therapist: Georgeanna Lea, PT, DPT              PT diagnosis/Reason for Referral:   Next Scheduled Physician Appointment: bilat shoulder pain  Allergies/Contraindications: n/a  Access Code: [email or web address]      Subjective: Patient rates her current pain level in the R shoulder is a 3/10 and a 3/10 on the L. She is comfortable with discharge today and states that she does not have any questions regarding the HEP she has been given.    Patient-Specific Functional Score:     Problem Score   1. Washing the dishes 8   2. Dressing 5   3. Dusting/Vacuuming  5   TOTAL 6         OBJECTIVE     Shoulder AROM    right left   Flexion 120 124   Extension 54 42   Abduction 121 124   ER 88 42   IR 53 35         Strength (defined as __/5 based on 0/5 - 5/5 grading system)       right left   Shoulder flexion 4+ 4   Shoulder abduction  4- 4-   Shoulder extension 4 4+   Shoulder IR 4 4+   Shoulder ER 4- 5         EXERCISE/ACTIVITY NAME    REPETITIONS RESISTANCE COMPLETED THIS DOS   Kinesio tape: bilat shoulders  --GH joint mechanical correction  --L: UT and supra inhibitory  --R UT inhibitory        n   Exercises  - Seated Upper Trapezius Stretch  - 2-3 x daily - 7 x weekly - 5 reps - 10s hold  - Seated Shoulder Row with Anchored Resistance  - 1-2 x daily - 7 x weekly - 3 sets - 12 reps  - Sidelying Shoulder External Rotation  - 1-2 x daily - 7 x weekly - 3 sets - 8 reps  - Standing Isometric Shoulder Internal Rotation at Doorway  - 1-2 x daily - 7 x weekly - 2 sets - 5 reps - 5s hold  - Seated Shoulder  Flexion AAROM with Pulley Behind  - 2-3 x daily - 7 x weekly - 12 reps - 2s hold  - Seated Shoulder Scaption AAROM with Pulley at Side  - 2-3 x daily - 7 x weekly - 12 reps - 2s hold        HEP given at eval 01/02/23   NuStep  UBE X11min(UE and LE)  X98min  lvl3 N  Y   Supine horizontal abd  Supine unilateral extension  Supine unilateral scaption    X15 ea  X15 ea  X9 ea Yellow Tband  Yellow Tband  Yellow Tband n  n  n   Seated lat pull    4x8 30# n   Single arm row X16 ea 10# n   Wall push ups: WBOS, NBOS, triangle X5 ea   n WBOS    MFR bilat  UT and periscap X5 min Roller ball n   Row machine 2x16 30# n   Sidelying ER, abd, flexion X16, x10, x10 AROM only n   Banded Shoulder IR  Banded Shoulder ER 2x14  3x10 Red TB  Yellow TB n  n   Over head ER banded seated x8 YTB n   Shoulder Hori Abd/Add @ flex 1x10 each #3 n   Shoulder Hori Abd/Add @ abd 1x10 each #3 n         Assessment: Patient continues to tolerate treatment well during sessions although no significant increases in resistance have been made. Improvement in pain levels are irregular and do not appear to be directly related to duration of use of the UE, but rather the specific direction of movement itself. I did have a detailed discussion with the patient regarding possible supplementation and POC following discharge during this session. Patient was instructed to trial the water based HEP she had been given during the visit on 01/31/2023 which she has not yet been able to perform as she is still waiting for a swimming suit. PT interventions have largely failed to improve her level of function to any meaningful degree and she is having a f/u appointment with the orthopedic department in order to discuss possible surgical options.      Short term goals (3 weeks):  -Patient will achieve a resting pain level of no greater than 3/10 in the bilateral shoulders. (Met 02/19/2023)  -Patient will be able to reach L shoulder end ROM for all 5 motions measured and without an  onset of pain greater than 4/10.  (Partially met 02/19/2023)  -Patient will achieve a global L shoulder MMT of at least 4 and without an onset of pain greater than 4/10. (Unmet 01/31/2023)  -Patient will be independent with her HEP.  (Partially met 02/19/2023)     Long term goals (6 weeks):  -Patient will achieve a resting pain level of no greater than 1/10 in the bilateral shoulders. (Unmet 02/19/2023)  -Patient will be able to reach L shoulder end ROM for all 5 motions measured and without an onset of pain greater than 2/10. (Unmet 02/19/2023)  -Patient will achieve a global L shoulder MMT of at least 4 and without an onset of pain greater than 2/10. (Unmet 02/19/2023)  -Patient will be independent with her final HEP. (Partially met 02/19/2023)     Plan:  Patient discharged during this session. Patient to f/u with orthopedic department to discuss possible surgical options.    Total Session Time 27  THERAPEUTIC EXERCISE 27 minutes      Creig Hines, PT  02/19/2023, 16:18

## 2023-03-12 DIAGNOSIS — R55 Syncope and collapse: Secondary | ICD-10-CM | POA: Insufficient documentation

## 2023-03-12 DIAGNOSIS — G609 Hereditary and idiopathic neuropathy, unspecified: Secondary | ICD-10-CM | POA: Insufficient documentation

## 2023-03-12 DIAGNOSIS — K760 Fatty (change of) liver, not elsewhere classified: Secondary | ICD-10-CM | POA: Insufficient documentation

## 2023-03-12 DIAGNOSIS — I739 Peripheral vascular disease, unspecified: Secondary | ICD-10-CM | POA: Insufficient documentation

## 2023-04-12 ENCOUNTER — Other Ambulatory Visit (INDEPENDENT_AMBULATORY_CARE_PROVIDER_SITE_OTHER): Payer: Self-pay | Admitting: OTOLARYNGOLOGY

## 2023-04-12 MED ORDER — FLUTICASONE PROPIONATE 50 MCG/ACTUATION NASAL SPRAY,SUSPENSION
2.0000 | Freq: Every day | NASAL | 11 refills | Status: DC
Start: 2023-04-12 — End: 2023-08-26

## 2023-05-21 NOTE — OR Surgeon (Signed)
 DATE OF SURGERY: 05/21/2023      PREOPERATIVE DIAGNOSIS:     Left carpal tunnel syndrome       POSTOPERATIVE DIAGNOSIS: same      PROCEDURE:   Left carpal tunnel release. [phone]).      SURGEON: Surgeons and Role:     * Zhongyu Gilberto Better, MD - Primary      ANESTHESIA:  Local       TOURNIQUET TIME: 0.      BLOOD LOSS: Minimal.      COMPLICATIONS: None.      PATHOLOGY: None.      TIME OUT: A time out was performed before the procedure started.      INDICATIONS: The patient is a 67 y.o. -year-old female who presented with carpal tunnel syndrome failing nonsurgical management, indicated for surgical release.      DESCRIPTION OF PROCEDURE: Supine position, Local infiltration with 10 cc of 1% lidocaine with Epinephrine, ChloraPrep and sterile drape.       A 2 cm palmar incision was made about 5 mm ulnar to the thenar crease. Palmar aponeurosis was exposed and divided in line with the skin incision. The palmaris brevis was visualized and divided. The distal edge of the transverse carpal ligament was identified. Hemostat was inserted into the carpal tunnel to protect the median nerve and the flexor tendons. The transverse carpal ligament was released under direct visualization. Proximally, a subcutaneous tunnel was made allowing a Sewell retractor to be placed. The distal portion of the antebrachial fascia was released. Distally, all fibrous bands were released. Median nerve was visualized, fat pad was exposed.  Hemostasis achieved.  The wound was irrigated. There was no intraneural lesion. Median nerve was enlarged and hyperemic. The wound closed with 4-0 Monocryl sutures. Sterile dressing applied. The patient was transferred to the recovery room in stable condition after all counts were correct.      POSTOPERATIVE PLAN: No heavy lifting for four weeks. Return in 8-14 days  to see PA/NP/RN for wound check, suture removal and indicated

## 2023-06-25 ENCOUNTER — Other Ambulatory Visit (HOSPITAL_COMMUNITY): Payer: Self-pay

## 2023-06-25 DIAGNOSIS — G5602 Carpal tunnel syndrome, left upper limb: Secondary | ICD-10-CM

## 2023-06-28 ENCOUNTER — Ambulatory Visit (HOSPITAL_COMMUNITY)
Admission: RE | Admit: 2023-06-28 | Discharge: 2023-06-28 | Disposition: A | Discharge status: Still In Hospital | Payer: Medicare Other | Source: Ambulatory Visit | Attending: NURSE PRACTITIONER | Admitting: NURSE PRACTITIONER

## 2023-06-28 ENCOUNTER — Other Ambulatory Visit: Payer: Self-pay

## 2023-06-28 ENCOUNTER — Encounter (HOSPITAL_COMMUNITY): Payer: Self-pay

## 2023-06-28 DIAGNOSIS — G5602 Carpal tunnel syndrome, left upper limb: Secondary | ICD-10-CM | POA: Insufficient documentation

## 2023-06-28 DIAGNOSIS — E559 Vitamin D deficiency, unspecified: Secondary | ICD-10-CM | POA: Insufficient documentation

## 2023-06-28 NOTE — OT Evaluation (Addendum)
[facility] Medicine [facility]  Outpatient Occupational Therapy  [address]  [place], [address]  [phone]  ([phone]      Occupational Therapy Evaluation    Date: 06/28/2023  Patient's Name: Dawn Riley  Date of Birth: 04-24-1956  Occupational Therapy Evaluation     Diagnosis/Chief complaint left carpal tunnel syndrome G 56.02    Patient Report of PLOF patient reports she was working full-time in [employer] and as a Engineer, structural.  PMH includes but is not limited to the following:   bilateral rotator cuff tear  without follow-up surgical repair.  Patient reports receiving outpatient physical therapy services for rotator cuff tears with good success.  Umbilical hernia s/p repair 2019, leaky heart valve, history of carpal tunnel in right-hand with follow-up surgical repair, history of right and left TKA surgically repaired separately a year apart.  Patient reports allergy to sulfa medications.  Currently taking vitamin D3 prescribed by PCP.    Patient Report of changes in functional level patient had has repair on her left hand 05/19/2023 in [facility] (outpatient surgery) and was discharged home same day    Marital Status; Married    Occupation : part time job doing Theatre stage manager at [employer], caregiver through [employer]    Residence: HOUSE 2 LEVEL HOME BUT PATIENT REPORTS THAT SHE DOES NOT GO DOWN TO LOWER LEVEL GARAGE.  PATIENT HAS MAIN FLOOR ACCESSIBILITY    Number of stairs inside: 8-10      handrail: 1 rail on right side descending  Number of stairs outside: 2 small steps going up to enter patient's home.  handrail: 0 patient reports stabilizing herself with her right arm going up the steps placing hand on outside surface of her home, exiting patient reports that she has a banister on the right side when descending stairs     Hearing: Normal     Vision: WEARS GLASSES    Assistive device:  None reported by patient    Adaptive equipment: None patient reporting  that she has elevated commode that she ordered for her home patient does not have seat riser, grab bars    Use of oxygen: is not on home oxygen therapy.    Precautions: NONE    Pain rating: 7/10 DAILY   Location:  BILATERAL SHOULDERS PAIN IS DESCRIBED AS CONSTANT AND COLD WEATHER, "MAKES IT WORSE. "  REALLY PATIENT REPORTS TAKING OTC TYLENOL ARTHRITIS 650 MG , IBUPROFEN 600 M. PATIENT ALSO REPORTING THAT SHE USES TOPICAL CREAMS SUCH AS BIOFREEZE TO ASSIST WITH PAIN MANAGEMENT AS NEEDED     Cognition: WFL    Orientation: time, place, and person, SITUATION    Follows directions: Complex    Long term memory: Long term intact  Short term memory: Short term intact    ADLs    Feeding: Independent   Grooming:Minimal Assistance PATIENT REPORTING DAUGHTER'S ASSIST WITH HAIR STYLING MANAGEMENT AND CARE AS NEEDED   Bathing UB: Modified Independent    Bathing VO:[id number] Independent    Toileting: Independent  Toileting hygiene:Modified Independent   Toilet transfers:Independent              HAS WALK-IN TUB, BUILT-IN SEAT, GRAB BAR, HANDHELD SHOWER SPRAYER     Dressing UB: Modified Independent   Dressing KV:[id number] Independent    Meal prep: Minimal Assistance PATIENT REPORTS THAT SPOUSE WILL ASSIST WITH CUTTING, LIFTING, PLACING THINGS INTO/OUT OF THE OVEN AS NEEDED.   Housework: Hydrographic surveyor  activities: Modified Independent     IADLs      Bedmaking:Modified Independent    Laundry:Minimal Assistance SPOUSE UNLOAD WASHER/DRYER AND WILL BRING CLOTHING FOR PATIENT TO SORT, FOLD, STACK WHILE SHE WAS SITTING.  SHE ALSO REPORTS HE WILL HELP WITH HEAVIER ITEMS SUCH AS BED SPREAD, BLANKETS FOR FOLDING AND CARRYING.   Dishes:Minimal Assistance SPOUSE WILL ASSIST WITH HAND WASHING POTS/PANS OR HEAVIER DISHES.   Vacumming:Modified Independent  SWIFFER  WET AND DRY FOR VACUUMING AND MOPPING   Sweeping:Modified Independent       Garbage removal:Maximum Assistance SPOUSE TYPICALLY DOES AS HIS ROLE.   Medication  management:Modified Independent    Grocery shopping:Modified Independent    Driving:Modified Independent USES PADDED STEERING WHEEL   Yardwork:Total Assistance SPOUSE DOES AS HIS PRIMARY ROLE    Functional Mobility Comment AMBULATORY WITHOUT THE USE OF AN ASSISTIVE DEVICE    Upper Extremity ROM : AROM WFL (75%)    Upper Extremity Strength:WFL BILATERAL SHOULDERS 3+ WITH PATIENT REPORTING PAIN WITH MMT.  ELBOWS 4/5, WRISTS F:  4/5, E 3+/5, RIGHT HAND AND FINGERS WFL FOR THUMB ABD/ADD AND FLEXION, MP/PIP/DIP F/E WITH OT NOTING DEGENERATIVE JOINT CHANGES ALL JOINTS BOTH HANDS.  LEFT HAND AND FINGERS 3+/5.  GRIP STRENGTH ASSESSMENT WITH DYNAMOMETER: RUE 50 PSI WITHIN NORMAL LIMITS FOR PATIENT'S AGE/DOMINANCE PER NORMATIVE DATA. LUE: 20 PSI 50% BELOW AVERAGE IN NON DOMINANT HAND PER NORMATIVE DATA.    Balance scores:   Sitting: Good    Standing:  Good    Dynamic:  Good -    Endurance: Within functional limits    Tone: WNL    Patient goals: RTW, INCREASE STRENGTH/ENDURANCE, INCREASE INDEPENDENCE WITH ADL/IADL, and INCREASE HAND DEXTERITY PATIENT REPORTS HAVING PERSONAL GOAL OF WEIGHT LOSS THROUGH INCREASED ACTIVITY AND FUNCTIONAL GAINS MADE TO USE BOTH UPPER EXTREMITIES MORE EFFICIENTLY AND WITH LESS PAIN FOR ALL DAILY LIVING ROUTINES    Learning preference: AUDIO, VISUAL, DEMONSTRATION, and PRINTED MATERIAL    Barriers to learning: None noted     Primary language  ENGLISH    Religious/cultural beliefs affecting care: No    POC/Statement of Necessity: DECREASED ROM RUE, DECREASED ROM LUE, DECREASED ROM BUE, DECREASED STRENGTH AFFECTING FUNCTION, DECREASED ADL/IADL FUNCTIONAL ACTIVITIES, INCREASED PAIN/CHRONIC PAIN AFFECTING FUNCTION, DECREASED WORK SIMPLIFICATION/ENERGY CONSERVATION TECHNIQUES, DECREASED GMC, and DECREASED FMC    Treatment plan:  EDUCATION, UB ROM/ENDURANCE, MYOFASCIAL MASSAGE, SCAR MASSAGE, IN-HAND MANIPULATION, GMC/FMC SKILLS, EC/WS, JP/BM AND HAND ERGONOMICS TRAINING.    Impression:  Patient is a  pleasant 67 year old female presenting to outpatient occupational therapy services this day status post left hand carpal tunnel surgery repair 05/19/2023.  Patient reports no S/S tingling/numbness since day of surgery.  Patient reports having increased difficulty with lift/carry/push/pull, fine motor and gross motor activities relating to ADLs/IADLs, job performance duties from primary diagnosis and comorbidity of bilateral rotator cuff tears without surgical repair but reporting that prior physical therapy services "were beneficial at the time. "  Patient has chronic pain in both shoulders, decreased AROM for shoulder F/E, IR, ER, ABD but range is [facility].  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  Patient is motivated and has goals to increase her level of activity, ROM, strength, and endurance promoting independence with less pain with self-care, transfers, job performance duties, all daily and occupational functional roles and responsibilities.  OT  providing home program (ASL worksheet to begin AROM all digits left hand requesting patient to complete per her tolerance b.i.d.).  OT providing education for beginning scar massage techniques, myofascial massage with patient demonstrating understanding and verbalizing understanding of all educational information, beginning AROM programs, instruction for use of vitamin-E oil promoting skin integrity.  OT noting skin is closed apex of left hand with dried skin surrounding incision without redness, warmth, no pain reported by patient.                                                                 PLAN                      Patient will attend 2 times per week x 8 weeks.       SHORT TERM GOALS TO BE MET WITHIN 4 WEEKS    1. PATIENT WILL RETURN DEMO: PACING, JP/BM, RETROGRADE/SCAR  MASSAGE AS APPROPRIATE DURING CLINICAL ACTIVITIES 75% OR MORE OF THE TIME WITH MIN VC/DC/TC AND/OR ASSISTANCE FROM OT PROMOTING INCREASED PARTICIPATION AND INDEPENDENCE W/ SELF CARE, SOCIAL AND  COMMUNITY REINTEGRATION.   2. PATIENT WILL RETURN DEMO CORRECT HAND ERGONOMICS 75% OR MORE OF THE TIME TO PROMOTE DECREASING JOINT PAIN/FATIGUE FOR MORE PRODUCTIVE USE OF BUE'S WITH DAILY ROUTINES.  3. THROUGH PARTICIPATION IN GRADED UB/ROM/ENDURANCE AND GRADED THERAPEUTIC ACTIVITY: PATIENT WILL INCREASE :  GMC/FMC SKILLS DEMONSTRATING 75% ACCURACY 7/10 TRIALS,  FUNCTIONAL GRIP STRENGTH X 5 PSI, AND IN-HAND MANIPULATION INCREASING OCCUPATIONAL PERFORMANCE WITH ALL DAILY LIFE ROUTINES.     LONG TERM GOALS TO BE MET WITHIN 8 WEEKS  1. PATIENT WILL RETURN DEMO: PACING, JP/BM, RETROGRADE/SCAR MASSAGE AS APPROPRIATE DURING CLINICAL ACTIVITIES 90% OR MORE OF THE TIME MOD I PROMOTING INCREASED PARTICIPATION AND INDEPENDENCE W/ SELF CARE, SOCIAL AND  COMMUNITY REINTEGRATION.   2. PATIENT WILL RETURN DEMO CORRECT HAND ERGONOMICS 90% OR MORE OF THE TIME MOD I FOR LIFT/CARRY/PUSH/PULL FOR MORE PRODUCTIVE USE OF BUE'S WITH DAILY ROUTINES W/ MAX PAIN REPORT 2/10.  3. THROUGH PARTICIPATION IN GRADED UB/ROM/ENDURANCE AND GRADED THERAPEUTIC ACTIVITY: PATIENT WILL INCREASE :  GMC/FMC SKILLS DEMONSTRATING 90% FOR MORE ACCURACY X 10 TRIALS,  FUNCTIONAL GRIP STRENGTH X 15 PSI, AND IN-HAND MANIPULATION INCREASING OCCUPATIONAL PERFORMANCE WITH ALL DAILY LIFE ROUTINES, JOB PERFORMANCE DUTIES.   Evaluation complexity:   Personal factors impacting POC: UNUSUAL DOMESTIC DEMANDS (IE CARING FOR SPOUSE/CHILD/PARENT), FREQUENT OR CHRONIC PAIN, PRE-EXISTING FUNCTIONAL LIMITATIONS, and OCCUPATIONAL ADLS (IE HEAVY LIFTING, REPETITIVE TASKS, LONG HOURS)   Co-morbidities impacting POC: HEARING/VISUAL IMPAIRMENTS and PREVIOUS SURGERIES  Complexity of physical exam: INCLUDING MUSCULOSKELETAL SYSTEM (POSTURE, ROM, STRENGTH, HEIGHT/WEIGHT), INCLUDING  NEUROMUSCULAR EXAM (BALANCE, GAIT, LOCOMOTION, MOBILITY), INCLUDING INTEGUMENTARY EXAM (TISSUE PLIABILITY, SCAR TISSUE, SKIN COLOR), INCLUDING ACTIVITY/MOBILITY RESTRICTIONS, and REFERRAL IS FOR A CHRONIC PROBLEM   Clinical Presentation: STABLE   Evaluation Complexity: LOW-HISTORY 0, EXAMINATION 1-2, STABLE PRESENTATION      Total Session Time 58 and Timed code minutes 58        Intervention minutes: EVALUATION 30 minutes and THERAPEUTIC ACTIVITY 28 minutes    Hillery Jacks, OTR/L, 06/28/2023 9:42 A.M.      Certification:    From:    Through:        I certify the need for these services furnished  under this plan of treatment and while under my care.    Referring Provider Signature:      Date:        Printed Name of Referring Provider:___________________________________________

## 2023-07-01 ENCOUNTER — Inpatient Hospital Stay (HOSPITAL_COMMUNITY)
Admission: RE | Admit: 2023-07-01 | Discharge: 2023-07-01 | Disposition: A | Discharge status: Still In Hospital | Payer: Self-pay | Source: Ambulatory Visit

## 2023-07-01 ENCOUNTER — Other Ambulatory Visit: Payer: Self-pay

## 2023-07-01 NOTE — OT Treatment (Signed)
[facility] Medicine [facility]  Outpatient Occupational Therapy  [address]  [place], [address]  [phone]  (Fax) [phone]    Occupational Therapy Treatment Note    Date: 07/01/2023  Patient's Name: Dawn Riley  Date of Birth: 28-Jun-1956  Occupational Therapy Visit  Visit 2/POC: 2 X WEEK X 8 WEEKS  Authorization:MDR, medical necessity  OT diagnosis: eft carpal tunnel syndrome G 56.02   Next Scheduled Physician Appointment: 07/05/23 at 8:15 a.m.  S:  Patient arrived in good spirits reporting that she was tired and sore but had been practicing home programs.  Patient reported that work was very long and tedious yesterday she is "feeling it. "  O:  Myofacial Massage. Patient continued and concluded session with tendon glides, AROM, and active stretching.  A:  Patient came in reporting she was feeling tired and unsure of how much to do and not to do with her left hand.  OT began assessing for skin integrity noting that incision is completely closed, there is an oval shape of darker pigmentation to the skin but patient reports that it does not, "hurt. "  OT requested for patient to position herself in supine on activity mat followed by OT performing myofascial massage to thenar, hypothenar eminence, dorsum and volar surfaces of patient's wrist and forearm noting tightness throughout.  OT concluded with stretch thumb and digit 5 for abduction followed by wrist extension prolonged per patient tolerance.  Patient reporting that her surgeon also told her that she was "tight' during her procedure.  Patient was able to tolerate extended stretching with positive results of patient reporting less tightness throughout her hand wrist and forearm.  Patient continued and concluded completing 1 x 3 reps tendon glides to include radial and medial, ASL letters A Z bilateral as she has history of CTS with surgical repair in right-hand.  Patient demonstrated improved mindfulness of posture, pacing and  positioning without increasing pressure through bilateral elbows.  Patient requiring Min verbal cues, tactile cues, demo cues from OT for correction of forms with radial/medial tendon glides.  OT encouraging patient to practice at home per her tolerance with understanding that patient is still working part-time 2 separate jobs.  OT discussed with patient her balance between home and personal time to allow herself time to relax, stretch and complete home programs knowing that she was investing time back into herself and report back to OT on follow-up session.  Patient demonstrating ASL demonstrating improved AROM with all letters, formation 75% plus accuracy with OT encouraging patient to pace with all activity throughout rehab process.  Patient thanking OT for 1st follow-up session post eval stating, "I will see on Friday." TOTAL TREATMENT TIME: 45 MINUTES  SHORT TERM GOALS TO BE MET WITHIN 4 WEEKS     1. PATIENT WILL RETURN DEMO: PACING, JP/BM, RETROGRADE/SCAR MASSAGE AS APPROPRIATE DURING CLINICAL ACTIVITIES 75% OR MORE OF THE TIME WITH MIN VC/DC/TC AND/OR ASSISTANCE FROM OT PROMOTING INCREASED PARTICIPATION AND INDEPENDENCE W/ SELF CARE, SOCIAL AND  COMMUNITY REINTEGRATION.  PROGRESSING  2. PATIENT WILL RETURN DEMO CORRECT HAND ERGONOMICS 75% OR MORE OF THE TIME TO PROMOTE DECREASING JOINT PAIN/FATIGUE FOR MORE PRODUCTIVE USE OF BUE'S WITH DAILY ROUTINES. PROGRESSING  3. THROUGH PARTICIPATION IN GRADED UB/ROM/ENDURANCE AND GRADED THERAPEUTIC ACTIVITY: PATIENT WILL INCREASE :  GMC/FMC SKILLS DEMONSTRATING 75% ACCURACY 7/10 TRIALS,  FUNCTIONAL GRIP STRENGTH X 5 PSI, AND IN-HAND MANIPULATION INCREASING OCCUPATIONAL PERFORMANCE WITH ALL DAILY LIFE ROUTINES. PROGRESSING     LONG TERM  GOALS TO BE MET WITHIN 8 WEEKS  1. PATIENT WILL RETURN DEMO: PACING, JP/BM, RETROGRADE/SCAR MASSAGE AS APPROPRIATE DURING CLINICAL ACTIVITIES 90% OR MORE OF THE TIME MOD I PROMOTING INCREASED PARTICIPATION AND INDEPENDENCE W/ SELF CARE,  SOCIAL AND  COMMUNITY REINTEGRATION.   2. PATIENT WILL RETURN DEMO CORRECT HAND ERGONOMICS 90% OR MORE OF THE TIME MOD I FOR LIFT/CARRY/PUSH/PULL FOR MORE PRODUCTIVE USE OF BUE'S WITH DAILY ROUTINES W/ MAX PAIN REPORT 2/10.  3. THROUGH PARTICIPATION IN GRADED UB/ROM/ENDURANCE AND GRADED THERAPEUTIC ACTIVITY: PATIENT WILL INCREASE :  GMC/FMC SKILLS DEMONSTRATING 90% FOR MORE ACCURACY X 10 TRIALS,  FUNCTIONAL GRIP STRENGTH X 15 PSI, AND IN-HAND MANIPULATION INCREASING OCCUPATIONAL PERFORMANCE WITH ALL DAILY LIFE ROUTINES, JOB PERFORMANCE DUTIES.   P:  Continue POC with followed up appointments scheduled.     Total Session Time 45 and Timed code minutes 45  THERAPEUTIC ACTIVITY 45 minutes    Hillery Jacks, OT  07/01/2023, 9:15 A.M.

## 2023-07-03 ENCOUNTER — Ambulatory Visit (HOSPITAL_COMMUNITY): Payer: Self-pay

## 2023-07-05 ENCOUNTER — Other Ambulatory Visit: Payer: Self-pay

## 2023-07-05 ENCOUNTER — Ambulatory Visit (HOSPITAL_COMMUNITY)
Admission: RE | Admit: 2023-07-05 | Discharge: 2023-07-05 | Disposition: A | Discharge status: Still In Hospital | Payer: Medicare Other | Source: Ambulatory Visit

## 2023-07-05 DIAGNOSIS — G5602 Carpal tunnel syndrome, left upper limb: Secondary | ICD-10-CM

## 2023-07-05 NOTE — OT Treatment (Signed)
[facility] Medicine [facility]  Outpatient Occupational Therapy  [address]  [place], [address]  [phone]  (Fax) [phone]    Occupational Therapy Treatment Note    Date: 07/05/2023  Patient's Name: Dawn Riley  Date of Birth: 11-06-1955  Occupational Therapy Visit  Visit 3/POC: 2 X WEEK X 8 WEEKS  Authorization: MDR, medical necessity  OT diagnosis: eft carpal tunnel syndrome G 56.02   Next Scheduled Physician Appointment:   S:  Patient arrived in good spirits reporting that she had been practicing home programs.  Patient reported, " I'm feeling more optimistic about what I am doing in occupational therapy.  The home programs are really helping me out a lot.  Feel like I can really see and understand how what I am doing is benefitting my hands and my whole-body. "  O:  Patient continued and concluded session with tendon glides, AROM, and active stretching.  A:   OT began with active stretch standing completing 3 x 10 reps wall slides in vertical/horizontal, circular positions.  Patient reporting increased ability to extend her wrist feeling, " less tight" during therapeutic activity in clinic and at home.  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  Patient demonstrated improved mindfulness of posture, pacing and positioning without increasing pressure through bilateral elbows.  Patient requiring Min verbal cues, tactile cues, demo cues from OT for correction of forms with radial/medial tendon glides.  OT encouraging patient to practice at home per her tolerance with understanding that patient is still working part-time 2 separate jobs.  OT discussed with patient her balance between home and personal time to allow herself time to relax, stretch and complete home programs knowing that she was investing time back into herself and report back to OT on  follow-up session.  Patient demonstrating ASL demonstrating improved AROM with all letters, formation 75% plus accuracy with OT encouraging patient to pace with all activity throughout rehab process.  Patient thanking OT for 1st follow-up session post eval stating, "I will see on Friday." TOTAL TREATMENT TIME: 45 MINUTES  SHORT TERM GOALS TO BE MET WITHIN 4 WEEKS     1. PATIENT WILL RETURN DEMO: PACING, JP/BM, RETROGRADE/SCAR MASSAGE AS APPROPRIATE DURING CLINICAL ACTIVITIES 75% OR MORE OF THE TIME WITH MIN VC/DC/TC AND/OR ASSISTANCE FROM OT PROMOTING INCREASED PARTICIPATION AND INDEPENDENCE W/ SELF CARE, SOCIAL AND  COMMUNITY REINTEGRATION.  PROGRESSING  2. PATIENT WILL RETURN DEMO CORRECT HAND ERGONOMICS 75% OR MORE OF THE TIME TO PROMOTE DECREASING JOINT PAIN/FATIGUE FOR MORE PRODUCTIVE USE OF BUE'S WITH DAILY ROUTINES. PROGRESSING  3. THROUGH PARTICIPATION IN GRADED UB/ROM/ENDURANCE AND GRADED THERAPEUTIC ACTIVITY: PATIENT WILL INCREASE :  GMC/FMC SKILLS DEMONSTRATING 75% ACCURACY 7/10 TRIALS,  FUNCTIONAL GRIP STRENGTH X 5 PSI, AND IN-HAND MANIPULATION INCREASING OCCUPATIONAL PERFORMANCE WITH ALL DAILY LIFE ROUTINES. PROGRESSING     LONG TERM GOALS TO BE MET WITHIN 8 WEEKS  1. PATIENT WILL RETURN DEMO: PACING, JP/BM, RETROGRADE/SCAR MASSAGE AS APPROPRIATE DURING CLINICAL ACTIVITIES 90% OR MORE OF THE TIME MOD I PROMOTING INCREASED PARTICIPATION AND INDEPENDENCE W/ SELF CARE, SOCIAL AND  COMMUNITY REINTEGRATION.   2. PATIENT WILL RETURN DEMO CORRECT HAND ERGONOMICS 90% OR MORE OF THE TIME MOD I FOR LIFT/CARRY/PUSH/PULL FOR MORE PRODUCTIVE USE OF BUE'S WITH DAILY ROUTINES W/ MAX PAIN REPORT 2/10.  3. THROUGH PARTICIPATION IN GRADED UB/ROM/ENDURANCE AND GRADED  THERAPEUTIC ACTIVITY: PATIENT WILL INCREASE :  GMC/FMC SKILLS DEMONSTRATING 90% FOR MORE ACCURACY X 10 TRIALS,  FUNCTIONAL GRIP STRENGTH X 15 PSI, AND IN-HAND MANIPULATION INCREASING OCCUPATIONAL PERFORMANCE WITH ALL DAILY LIFE ROUTINES, JOB PERFORMANCE  DUTIES.   P:  Continue POC with followed up appointments scheduled.     Total Session Time 45 and Timed code minutes 45  THERAPEUTIC ACTIVITY 45 minutes    Hillery Jacks, OT  07/05/2023, 3:24 P.M.

## 2023-07-08 ENCOUNTER — Other Ambulatory Visit: Payer: Self-pay

## 2023-07-08 ENCOUNTER — Ambulatory Visit (HOSPITAL_COMMUNITY)
Admission: RE | Admit: 2023-07-08 | Discharge: 2023-07-08 | Disposition: A | Discharge status: Still In Hospital | Payer: Medicare Other | Source: Ambulatory Visit

## 2023-07-08 DIAGNOSIS — R109 Unspecified abdominal pain: Secondary | ICD-10-CM | POA: Insufficient documentation

## 2023-07-08 DIAGNOSIS — G5602 Carpal tunnel syndrome, left upper limb: Secondary | ICD-10-CM

## 2023-07-08 NOTE — OT Treatment (Signed)
[facility]  Outpatient Occupational Therapy  [address]  [place], [address]  [phone]  (Fax) [phone]    Occupational Therapy Treatment Note    Date: 07/08/2023  Patient's Name: Dawn Riley  Date of Birth: 05/01/1956  Occupational Therapy Visit  Visit 4/POC: 2 X WEEK X 8 WEEKS.   Next Physician Appointment  S:   Patient arrived to treatment session reporting she continues to practice all home programs, but requested review with desensitization techniques and use of vitamin-E oil for myofascial massage.  Patient reported, " I'm feeling more optimistic about what I am doing in occupational therapy.  Yesterday, I worked at [employer].  I noticed I was having some pain in my left wrist in the muscles shortly past that area.  I did take the time after I came home to stretch my wrist using tendon glide techniques, which helped alleviate pain over time.  I am learning that I think should stretch more while I am at work but it is just remembering to take the time and give myself the time stretch as I need to.  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  I slide the booster seat and high chairs, push the tables instead of picking these items up, and I am not carrying food trays.  My doctor provided me with and accommodating medical note to not lift anything over 5 lbs, which has been a big help along with learning modification techniques in OT"  O:  OT began with review: Application of vitamin-E oil, myofascial massage and desensitization using light toothbrush over thenar/hypothenar eminence, apex of left-hand.  Patient engaging in UB ROM/endurance: 2 x 10 reps with therapy ball 3.3 lbs.  OT providing written handout as visual aid.  Patient continued and concluded session w/ AROM, and active stretching.  A:   OT began with review: Application of vitamin-E oil, myofascial massage and desensitization using light toothbrush over  thenar/hypothenar eminence, apex of left-hand.  Patient reporting massage feels good and had questions regarding overall healing of her hand with OT providing reassurance that skin integrity is good, pliability improves with all myofascial massage with patient return demo physical demonstration myofascial massage using her R hand with OT providing SPV for pressure application, positioning, pacing.  TOTAL TIME:  15 MIN.   Patient engaging in UB ROM/endurance: 2 x 10 reps with therapy ball 3.3 lbs.  OT providing written handout as visual aid, Min VC/DC/DC for posture, pacing and correction of forms.  Patient remained in sitting and was able to return demo the following, bilateral:  Shoulder flexion, overhead triceps extension, chest press/elbow extension while maintaining functional grasp on therapy ball, pronation/supination, diagonals and concluding with individual bicep curls.  TOTAL TIME: 15 MIN.  Patient continued and concluded session demo  active stretch standing completing 3 x 10 reps wall slides in vertical/horizontal, circular positions.  Patient reporting increased ability to extend her wrist feeling, " less tight" during therapeutic activity in clinic and at home.  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  Patient demonstrated improved mindfulness of posture, pacing and positioning without increasing pressure through bilateral elbows.  Patient demonstrating ASL demonstrating improved AROM with all letters, formation 90% plus accuracy with OT encouraging patient to pace with all  activity throughout rehab process.  TOTAL TIME:  15 MIN.  Patient thanking OT for 1st follow-up session post eval stating, "I will see on THURSDAY." TOTAL TREATMENT TIME: 45 MINUTES  SHORT TERM GOALS TO BE MET WITHIN 4 WEEKS     1. PATIENT WILL RETURN DEMO: PACING, JP/BM, RETROGRADE/SCAR MASSAGE AS  APPROPRIATE DURING CLINICAL ACTIVITIES 75% OR MORE OF THE TIME WITH MIN VC/DC/TC AND/OR ASSISTANCE FROM OT PROMOTING INCREASED PARTICIPATION AND INDEPENDENCE W/ SELF CARE, SOCIAL AND  COMMUNITY REINTEGRATION.  PROGRESSING  2. PATIENT WILL RETURN DEMO CORRECT HAND ERGONOMICS 75% OR MORE OF THE TIME TO PROMOTE DECREASING JOINT PAIN/FATIGUE FOR MORE PRODUCTIVE USE OF BUE'S WITH DAILY ROUTINES. PROGRESSING  3. THROUGH PARTICIPATION IN GRADED UB/ROM/ENDURANCE AND GRADED THERAPEUTIC ACTIVITY: PATIENT WILL INCREASE :  GMC/FMC SKILLS DEMONSTRATING 75% ACCURACY 7/10 TRIALS,  FUNCTIONAL GRIP STRENGTH X 5 PSI, AND IN-HAND MANIPULATION INCREASING OCCUPATIONAL PERFORMANCE WITH ALL DAILY LIFE ROUTINES. PROGRESSING     LONG TERM GOALS TO BE MET WITHIN 8 WEEKS  1. PATIENT WILL RETURN DEMO: PACING, JP/BM, RETROGRADE/SCAR MASSAGE AS APPROPRIATE DURING CLINICAL ACTIVITIES 90% OR MORE OF THE TIME MOD I PROMOTING INCREASED PARTICIPATION AND INDEPENDENCE W/ SELF CARE, SOCIAL AND  COMMUNITY REINTEGRATION.   2. PATIENT WILL RETURN DEMO CORRECT HAND ERGONOMICS 90% OR MORE OF THE TIME MOD I FOR LIFT/CARRY/PUSH/PULL FOR MORE PRODUCTIVE USE OF BUE'S WITH DAILY ROUTINES W/ MAX PAIN REPORT 2/10.  3. THROUGH PARTICIPATION IN GRADED UB/ROM/ENDURANCE AND GRADED THERAPEUTIC ACTIVITY: PATIENT WILL INCREASE :  GMC/FMC SKILLS DEMONSTRATING 90% FOR MORE ACCURACY X 10 TRIALS,  FUNCTIONAL GRIP STRENGTH X 15 PSI, AND IN-HAND MANIPULATION INCREASING OCCUPATIONAL PERFORMANCE WITH ALL DAILY LIFE ROUTINES, JOB PERFORMANCE DUTIES.   P:  Continue POC with followed up appointments scheduled.     Total Session Time 45 and Timed code minutes 45  THERAPEUTIC ACTIVITY 45 minutes    Hillery Jacks, OTR/L 07/08/2023, 8:52 A.M.

## 2023-07-11 ENCOUNTER — Ambulatory Visit
Admission: RE | Admit: 2023-07-11 | Discharge: 2023-07-11 | Disposition: A | Discharge status: Still In Hospital | Payer: Medicare Other | Source: Ambulatory Visit

## 2023-07-11 ENCOUNTER — Other Ambulatory Visit: Payer: Self-pay

## 2023-07-11 DIAGNOSIS — G5602 Carpal tunnel syndrome, left upper limb: Secondary | ICD-10-CM

## 2023-07-11 NOTE — OT Treatment (Signed)
[facility]  Outpatient Occupational Therapy  [address]  [place], [address]  [phone]  (Fax) [phone]    Occupational Therapy Treatment Note    Date: 07/11/2023  Patient's Name: Dawn Riley  Date of Birth: Sep 24, 1955  Occupational Therapy Visit  Visit 5/POC: 2 X WEEK X 8 WEEKS.   Next Physician Appointment  S:   Patient arrived to treatment session reporting she continues to practice all home programs. Patient reported, " I'm blessed.  I am setting boundaries, learning to say no when I need to for my own mental, emotional and physical well-being.  That is not how I used to do things in the past.  I am learning that for me to continue to help other people which is something I love to do, I have to love me just as much. "  O:  OT began with review: Application of vitamin-E oil, myofascial massage over thenar/hypothenar eminence, apex of left-hand.  Continued session engaging in active tendon glides (median, ulnar) with OT providing Min assist for positioning as needed.  Patient engaging in UB ROM/endurance: 2 x 10 reps with therapy ball 3.3 lbs.  OT providing written handout as visual aid.  Patient continued and concluded session w/ AROM, and active stretching.  A:   OT began with review: Application of vitamin-E oil, myofascial massage  using over thenar/hypothenar eminence, apex of left-hand.  Patient reporting massage feels good and had questions regarding overall healing of her hand with OT providing reassurance that skin integrity is good, pliability improves with all myofascial massage with patient return demo physical demonstration myofascial massage using her R hand with OT providing SPV for pressure application, positioning, pacing.  TOTAL TIME:  15 MIN. Continued session engaging in active tendon glides (median, ulnar) with OT providing Min assist for positioning as needed.  Patient demonstrating 75% tolerance to all ROM/active stretching with use of  tendon glides reminding OT that some positions are, "uncomfortable because of my previous rotator cuff tears.  " TOTAL TIME: 13 MIN.  Patient CONCLUDED SESSION engaging in UB ROM/endurance: 2 x 10 reps with therapy ball 3.3 lbs.  OT providing written handout as visual aid, Min VC/DC/DC for posture, pacing and correction of forms.  Patient remained in sitting and was able to return demo the following, bilateral:  Shoulder flexion, overhead triceps extension, chest press/elbow extension while maintaining functional grasp on therapy ball, pronation/supination, diagonals and concluding with individual bicep curls.  TOTAL TIME: 15 MIN.   TOTAL TREATMENT TIME: 45 MINUTES  SHORT TERM GOALS TO BE MET WITHIN 4 WEEKS     1. PATIENT WILL RETURN DEMO: PACING, JP/BM, RETROGRADE/SCAR MASSAGE AS APPROPRIATE DURING CLINICAL ACTIVITIES 75% OR MORE OF THE TIME WITH MIN VC/DC/TC AND/OR ASSISTANCE FROM OT PROMOTING INCREASED PARTICIPATION AND INDEPENDENCE W/ SELF CARE, SOCIAL AND  COMMUNITY REINTEGRATION.  PROGRESSING  2. PATIENT WILL RETURN DEMO CORRECT HAND ERGONOMICS 75% OR MORE OF THE TIME TO PROMOTE DECREASING JOINT PAIN/FATIGUE FOR MORE PRODUCTIVE USE OF BUE'S WITH DAILY ROUTINES. PROGRESSING  3. THROUGH PARTICIPATION IN GRADED UB/ROM/ENDURANCE AND GRADED THERAPEUTIC ACTIVITY: PATIENT WILL INCREASE :  GMC/FMC SKILLS DEMONSTRATING 75% ACCURACY 7/10 TRIALS,  FUNCTIONAL GRIP STRENGTH X 5 PSI, AND IN-HAND MANIPULATION INCREASING OCCUPATIONAL PERFORMANCE WITH ALL DAILY LIFE ROUTINES. PROGRESSING     LONG TERM GOALS TO BE MET WITHIN 8 WEEKS  1. PATIENT WILL RETURN DEMO: PACING, JP/BM, RETROGRADE/SCAR MASSAGE AS APPROPRIATE DURING CLINICAL ACTIVITIES 90% OR MORE OF THE TIME MOD  I PROMOTING INCREASED PARTICIPATION AND INDEPENDENCE W/ SELF CARE, SOCIAL AND  COMMUNITY REINTEGRATION.   2. PATIENT WILL RETURN DEMO CORRECT HAND ERGONOMICS 90% OR MORE OF THE TIME MOD I FOR LIFT/CARRY/PUSH/PULL FOR MORE PRODUCTIVE USE OF BUE'S WITH DAILY  ROUTINES W/ MAX PAIN REPORT 2/10.  3. THROUGH PARTICIPATION IN GRADED UB/ROM/ENDURANCE AND GRADED THERAPEUTIC ACTIVITY: PATIENT WILL INCREASE :  GMC/FMC SKILLS DEMONSTRATING 90% FOR MORE ACCURACY X 10 TRIALS,  FUNCTIONAL GRIP STRENGTH X 15 PSI, AND IN-HAND MANIPULATION INCREASING OCCUPATIONAL PERFORMANCE WITH ALL DAILY LIFE ROUTINES, JOB PERFORMANCE DUTIES.   P:  Continue POC with followed up appointments scheduled.     Total Session Time 45 and Timed code minutes 45  THERAPEUTIC ACTIVITY 45 minutes    Hillery Jacks, OTR/L 07/11/2023, 9:02 A.M.

## 2023-07-15 ENCOUNTER — Other Ambulatory Visit: Payer: Self-pay

## 2023-07-15 ENCOUNTER — Inpatient Hospital Stay (HOSPITAL_COMMUNITY)
Admission: RE | Admit: 2023-07-15 | Discharge: 2023-07-15 | Disposition: A | Discharge status: Still In Hospital | Payer: Self-pay | Source: Ambulatory Visit

## 2023-07-15 DIAGNOSIS — G5602 Carpal tunnel syndrome, left upper limb: Secondary | ICD-10-CM | POA: Insufficient documentation

## 2023-07-15 NOTE — OT Treatment (Signed)
[facility]  Outpatient Occupational Therapy  [address]  [place], [address]  [phone]  (Fax) [phone]    Occupational Therapy Treatment Note    Date: 07/15/2023  Patient's Name: Dawn Riley  Date of Birth: 12-26-1955  Occupational Therapy Visit  Visit 6/POC: 2 X WEEK X 8 WEEKS.   Next Physician Appointment  S:   Patient arrived to treatment session in good spirits. Patient stated that she could only participate in 30 minutes of the session.  O:  OT began with review: Application of vitamin-E oil, myofascial massage over thenar/hypothenar eminence, apex of left-hand.  Continued session engaging in active tendon glides (median, ulnar) with OT providing Min assist for positioning as needed.  Patient engaging in UB ROM/endurance: 2 x 10 reps with therapy ball 3.3 lbs.  OT providing written handout as visual aid.  Patient continued and concluded session w/ AROM, and active stretching.  A:   Patient began with UE ergometer.   TOTAL TIME:  15 MIN. Continued session engaging in flexbar strengthening exercises as follows: grip, wrist flexion/extension, wrist pronation and supination, wrist UD/RD, shoulder abduction and adduction, thumb abduction and adduction, thumb flexion. TOTAL TIME: 15 MIN.   TOTAL TREATMENT TIME: 30 MINUTES  P:  SHORT TERM GOALS TO BE MET WITHIN 4 WEEKS     1. PATIENT WILL RETURN DEMO: PACING, JP/BM, RETROGRADE/SCAR MASSAGE AS APPROPRIATE DURING CLINICAL ACTIVITIES 75% OR MORE OF THE TIME WITH MIN VC/DC/TC AND/OR ASSISTANCE FROM OT PROMOTING INCREASED PARTICIPATION AND INDEPENDENCE W/ SELF CARE, SOCIAL AND  COMMUNITY REINTEGRATION.  PROGRESSING  2. PATIENT WILL RETURN DEMO CORRECT HAND ERGONOMICS 75% OR MORE OF THE TIME TO PROMOTE DECREASING JOINT PAIN/FATIGUE FOR MORE PRODUCTIVE USE OF BUE'S WITH DAILY ROUTINES. PROGRESSING  3. THROUGH PARTICIPATION IN GRADED UB/ROM/ENDURANCE AND GRADED THERAPEUTIC ACTIVITY: PATIENT WILL INCREASE :  GMC/FMC SKILLS  DEMONSTRATING 75% ACCURACY 7/10 TRIALS,  FUNCTIONAL GRIP STRENGTH X 5 PSI, AND IN-HAND MANIPULATION INCREASING OCCUPATIONAL PERFORMANCE WITH ALL DAILY LIFE ROUTINES. PROGRESSING     LONG TERM GOALS TO BE MET WITHIN 8 WEEKS  1. PATIENT WILL RETURN DEMO: PACING, JP/BM, RETROGRADE/SCAR MASSAGE AS APPROPRIATE DURING CLINICAL ACTIVITIES 90% OR MORE OF THE TIME MOD I PROMOTING INCREASED PARTICIPATION AND INDEPENDENCE W/ SELF CARE, SOCIAL AND  COMMUNITY REINTEGRATION.   2. PATIENT WILL RETURN DEMO CORRECT HAND ERGONOMICS 90% OR MORE OF THE TIME MOD I FOR LIFT/CARRY/PUSH/PULL FOR MORE PRODUCTIVE USE OF BUE'S WITH DAILY ROUTINES W/ MAX PAIN REPORT 2/10.  3. THROUGH PARTICIPATION IN GRADED UB/ROM/ENDURANCE AND GRADED THERAPEUTIC ACTIVITY: PATIENT WILL INCREASE :  GMC/FMC SKILLS DEMONSTRATING 90% FOR MORE ACCURACY X 10 TRIALS,  FUNCTIONAL GRIP STRENGTH X 15 PSI, AND IN-HAND MANIPULATION INCREASING OCCUPATIONAL PERFORMANCE WITH ALL DAILY LIFE ROUTINES, JOB PERFORMANCE DUTIES.   P:  Continue POC with followed up appointments scheduled.     Total Session Time 30 and Timed code minutes 30  THERAPEUTIC ACTIVITY 30 minutes    Hillery Jacks, OTR/L 07/11/2023, 9:02 A.M.

## 2023-07-17 ENCOUNTER — Ambulatory Visit (HOSPITAL_COMMUNITY)
Admission: RE | Admit: 2023-07-17 | Discharge: 2023-07-17 | Disposition: A | Discharge status: Still In Hospital | Payer: Medicare Other | Source: Ambulatory Visit

## 2023-07-17 ENCOUNTER — Other Ambulatory Visit: Payer: Self-pay

## 2023-07-17 DIAGNOSIS — G5602 Carpal tunnel syndrome, left upper limb: Secondary | ICD-10-CM

## 2023-07-17 NOTE — OT Treatment (Addendum)
[facility]  Outpatient Occupational Therapy  [address]  [place], [address]  [phone]  (Fax) [phone]    Occupational Therapy Treatment Note    Date: 07/17/2023  Patient's Name: Dawn Riley  Date of Birth: September 16, 1955  Occupational Therapy Visit  Visit 7/POC: 2 X WEEK X 8 WEEKS.   Next Physician Appointment  S:   Patient arrived to treatment session in good spirits. Patient reporting she is excited because she will be going [facility] today where they are offering free blood work services.  "In the meantime, I know my grip continues to get better.  I was able to sweep the floor and hold a broom and dust pan with less exertion and did not feel tired or in pain at the end.  I feel better about myself in the progress made so far.  "  O:  Patient began session with bilateral ASL, continuing session engaging in active tendon glides (median, ulnar) with OT providing GEN SPV for positioning as needed.  Patient engaging in UB ROM/endurance:  FLEX BAR 10 LB S RESISTANCE 2 x 10 reps in sitting using written handout as visual aid.  Patient concluded session w/ AROM, and active stretching.  A:   Patient   session with bilateral ASL, continuing session engaging in active tendon glides (median, ulnar) with OT providing GEN SPV for positioning as needed.  Patient reports that she enjoys doing her active stretches with ASL while she is driving, "when I am at a red light or a stop sign.  I feel like it helps prep me and give me a better session after I get here.  " OT to continue active stretching with incorporation of ASL and tendon glides bilateral until patient demonstrating full independence with notation that OT provided GEN SPV today's session.  OT noting improved posture, pacing, correction of forms.   TOTAL TIME:  20 MIN.   Patient engaging in UB ROM/endurance:  FLEX BAR 10 LB S RESISTANCE 2 x 10 reps in sitting using written handout as visual aid.  Patient able to  return demo the following: grip strengthening, wrist flexion/extension, wrist pronation/supination, wrist UD/RD, shoulder abduction/adduction, thumb abduction/adduction, thumb flexion, elbow oscillation, shoulder oscillation, hand soft tissue mobilization.  OT providing Min VC/DC enhancing patient education, application for improved posture, pacing, hand ergonomics and correction of forms.  Patient reporting minimal fatigue that resolved post active stretching  Patient concluded session w/ AROM, and active stretching.  Continuing to progress towards all STG/LTG from POC.  TOTAL TIME: 25 MIN.   TOTAL TREATMENT TIME: 45 MINUTES  P:  Continue treatment per POC with follow-up appointment scheduled.  SHORT TERM GOALS TO BE MET WITHIN 4 WEEKS     1. PATIENT WILL RETURN DEMO: PACING, JP/BM, RETROGRADE/SCAR MASSAGE AS APPROPRIATE DURING CLINICAL ACTIVITIES 75% OR MORE OF THE TIME WITH MIN VC/DC/TC AND/OR ASSISTANCE FROM OT PROMOTING INCREASED PARTICIPATION AND INDEPENDENCE W/ SELF CARE, SOCIAL AND  COMMUNITY REINTEGRATION.  Met 07/17/2023 X 1.  2. PATIENT WILL RETURN DEMO CORRECT HAND ERGONOMICS 75% OR MORE OF THE TIME TO PROMOTE DECREASING JOINT PAIN/FATIGUE FOR MORE PRODUCTIVE USE OF BUE'S WITH DAILY ROUTINES.  Met 07/17/2023 X 1.  3. THROUGH PARTICIPATION IN GRADED UB/ROM/ENDURANCE AND GRADED THERAPEUTIC ACTIVITY: PATIENT WILL INCREASE :  GMC/FMC SKILLS DEMONSTRATING 75% ACCURACY 7/10 TRIALS,  FUNCTIONAL GRIP STRENGTH X 5 PSI, AND IN-HAND MANIPULATION INCREASING OCCUPATIONAL PERFORMANCE WITH ALL DAILY LIFE ROUTINES.  Met 07/17/2023 X 1.  LONG TERM GOALS TO BE MET WITHIN 8 WEEKS  1. PATIENT WILL RETURN DEMO: PACING, JP/BM, RETROGRADE/SCAR MASSAGE AS APPROPRIATE DURING CLINICAL ACTIVITIES 90% OR MORE OF THE TIME MOD I PROMOTING INCREASED PARTICIPATION AND INDEPENDENCE W/ SELF CARE, SOCIAL AND  COMMUNITY REINTEGRATION.   2. PATIENT WILL RETURN DEMO CORRECT HAND ERGONOMICS 90% OR MORE OF THE TIME MOD I FOR  LIFT/CARRY/PUSH/PULL FOR MORE PRODUCTIVE USE OF BUE'S WITH DAILY ROUTINES W/ MAX PAIN REPORT 2/10.  3. THROUGH PARTICIPATION IN GRADED UB/ROM/ENDURANCE AND GRADED THERAPEUTIC ACTIVITY: PATIENT WILL INCREASE :  GMC/FMC SKILLS DEMONSTRATING 90% FOR MORE ACCURACY X 10 TRIALS,  FUNCTIONAL GRIP STRENGTH X 15 PSI, AND IN-HAND MANIPULATION INCREASING OCCUPATIONAL PERFORMANCE WITH ALL DAILY LIFE ROUTINES, JOB PERFORMANCE DUTIES.   P:  Continue POC with followed up appointments scheduled.     Total Session Time 45 and Timed code minutes 45  THERAPEUTIC ACTIVITY 45 minutes    Hillery Jacks, OTR/L 07/17/2023, 9:24 A.M.

## 2023-07-23 ENCOUNTER — Other Ambulatory Visit: Payer: Self-pay

## 2023-07-23 ENCOUNTER — Ambulatory Visit (HOSPITAL_COMMUNITY)
Admission: RE | Admit: 2023-07-23 | Discharge: 2023-07-23 | Disposition: A | Discharge status: Still In Hospital | Payer: Medicare Other | Source: Ambulatory Visit

## 2023-07-23 DIAGNOSIS — G5602 Carpal tunnel syndrome, left upper limb: Secondary | ICD-10-CM

## 2023-07-23 NOTE — OT Treatment (Addendum)
[facility]  Outpatient Occupational Therapy  [address]  [place], [address]  [phone]  (Fax) [phone]    Occupational Therapy Treatment Note    Date: 07/23/2023  Patient's Name: Dawn Riley  Date of Birth: 1955/10/22  Occupational Therapy Visit  Visit 8/POC: 2 X WEEK X 8 WEEKS.   Next Physician Appointment  S:   Patient arrived to treatment session in good spirits. Patient reporting she is somewhat concerned expressing, "I am not sure if I have had a stitch that has not dissolved.  I feel a couple of hard places underneath my skin and I just want to make sure everything is okay.  It feels like I have not did not in my hand at the base of my hand. "  O:  Patient began session with bilateral ASL, continuing session engaging in active tendon glides (median, ulnar) with OT providing GEN SPV for positioning as needed.  Patient engaging in UB ROM/endurance:  FLEX BAR 10 LB S RESISTANCE 2 x 15 reps in sitting using written handout as visual aid.  Patient concluded session w/ AROM, and active stretching.  A:   Patient   session with bilateral ASL, continuing session engaging in active tendon glides (median, ulnar) with OT providing GEN SPV for positioning as needed.  Patient reports that she enjoys doing her active stretches with ASL while she is driving, "when I am at a red light or a stop sign.  I feel like it helps prep me and give me a better session after I get here.  " OT to continue active stretching with incorporation of ASL and tendon glides bilateral until patient demonstrating full independence with notation that OT provided GEN SPV today's session.  OT noting improved posture, pacing, correction of forms.   TOTAL TIME:  20 MIN.   Patient engaging in UB ROM/endurance:  FLEX BAR 10 LB S RESISTANCE 2 x 15 reps in sitting using written handout as visual aid.  Patient able to return demo the following: grip strengthening, wrist flexion/extension, wrist  pronation/supination, wrist UD/RD, shoulder abduction/adduction, thumb abduction/adduction, thumb flexion, elbow oscillation, shoulder oscillation, hand soft tissue mobilization.  OT providing GEN SPV enhancing patient education, application for improved posture, pacing, hand ergonomics and correction of forms.  Patient reporting minimal fatigue that resolved post active stretching  Patient concluded session w/ AROM, and active stretching.  Continuing to progress towards all STG/LTG from POC.  TOTAL TIME: 25 MIN.   TOTAL TREATMENT TIME: 45 MINUTES  P:  Continue treatment per POC with follow-up appointment scheduled.  SHORT TERM GOALS TO BE MET WITHIN 4 WEEKS     1. PATIENT WILL RETURN DEMO: PACING, JP/BM, RETROGRADE/SCAR MASSAGE AS APPROPRIATE DURING CLINICAL ACTIVITIES 75% OR MORE OF THE TIME WITH MIN VC/DC/TC AND/OR ASSISTANCE FROM OT PROMOTING INCREASED PARTICIPATION AND INDEPENDENCE W/ SELF CARE, SOCIAL AND  COMMUNITY REINTEGRATION.  Met 07/17/2023 X 1.  Met 07/23/2023 X 2.  2. PATIENT WILL RETURN DEMO CORRECT HAND ERGONOMICS 75% OR MORE OF THE TIME TO PROMOTE DECREASING JOINT PAIN/FATIGUE FOR MORE PRODUCTIVE USE OF BUE'S WITH DAILY ROUTINES.  Met 07/17/2023 X 1.  Met 07/23/2023 X 2.  3. THROUGH PARTICIPATION IN GRADED UB/ROM/ENDURANCE AND GRADED THERAPEUTIC ACTIVITY: PATIENT WILL INCREASE :  GMC/FMC SKILLS DEMONSTRATING 75% ACCURACY 7/10 TRIALS,  FUNCTIONAL GRIP STRENGTH X 5 PSI, AND IN-HAND MANIPULATION INCREASING OCCUPATIONAL PERFORMANCE WITH ALL DAILY LIFE ROUTINES.  Met 07/17/2023 X 1.    LONG TERM GOALS TO  BE MET WITHIN 8 WEEKS  1. PATIENT WILL RETURN DEMO: PACING, JP/BM, RETROGRADE/SCAR MASSAGE AS APPROPRIATE DURING CLINICAL ACTIVITIES 90% OR MORE OF THE TIME MOD I PROMOTING INCREASED PARTICIPATION AND INDEPENDENCE W/ SELF CARE, SOCIAL AND  COMMUNITY REINTEGRATION.   2. PATIENT WILL RETURN DEMO CORRECT HAND ERGONOMICS 90% OR MORE OF THE TIME MOD I FOR LIFT/CARRY/PUSH/PULL FOR MORE PRODUCTIVE USE OF BUE'S  WITH DAILY ROUTINES W/ MAX PAIN REPORT 2/10.  3. THROUGH PARTICIPATION IN GRADED UB/ROM/ENDURANCE AND GRADED THERAPEUTIC ACTIVITY: PATIENT WILL INCREASE :  GMC/FMC SKILLS DEMONSTRATING 90% FOR MORE ACCURACY X 10 TRIALS,  FUNCTIONAL GRIP STRENGTH X 15 PSI, AND IN-HAND MANIPULATION INCREASING OCCUPATIONAL PERFORMANCE WITH ALL DAILY LIFE ROUTINES, JOB PERFORMANCE DUTIES.   P:  Continue POC with followed up appointments scheduled.     Total Session Time 45 and Timed code minutes 45  THERAPEUTIC ACTIVITY 45 minutes    Hillery Jacks, OTR/L 07/23/2023, 8:43 A.M.

## 2023-07-25 ENCOUNTER — Ambulatory Visit (HOSPITAL_COMMUNITY)
Admission: RE | Admit: 2023-07-25 | Discharge: 2023-07-25 | Disposition: A | Discharge status: Still In Hospital | Payer: Medicare Other | Source: Ambulatory Visit

## 2023-07-25 ENCOUNTER — Ambulatory Visit (INDEPENDENT_AMBULATORY_CARE_PROVIDER_SITE_OTHER): Payer: Self-pay | Admitting: OTOLARYNGOLOGY

## 2023-07-25 ENCOUNTER — Other Ambulatory Visit: Payer: Self-pay

## 2023-07-25 DIAGNOSIS — G5602 Carpal tunnel syndrome, left upper limb: Secondary | ICD-10-CM

## 2023-07-25 NOTE — OT Treatment (Signed)
[facility]  Outpatient Occupational Therapy  [address]  [place], [address]  [phone]  (Fax) [phone]    Occupational Therapy Treatment Note    Date: 07/25/2023  Patient's Name: Dawn Riley  Date of Birth: Nov 20, 1955  Occupational Therapy Visit  Visit 9/POC: 2 X WEEK X 8 WEEKS.   Next Physician Appointment  S:   Patient arrived to treatment session in good spirits. Patient reporting she is somewhat concerned expressing, "I am not sure if I have had a stitch that has not dissolved.  I feel a couple of hard places underneath my skin and I just want to make sure everything is okay.  It feels like I have not did not in my hand at the base of my hand. "  O:  Patient began session with bilateral ASL, continuing session engaging in active tendon glides (median, ulnar) with OT providing GEN SPV for positioning as needed.  Patient engaging in UB ROM/endurance:  MEDIUM RESISTANCE THERA PUTTY 1 X 5 reps each hand using written handout as visual aid.  Patient concluded session w/ AROM, and active stretching.  A:   Patient   session with bilateral ASL, continuing session engaging in active tendon glides (median, ulnar) with OT providing GEN SPV for positioning as needed.  Patient reports that she enjoys doing her active stretches with ASL while she is driving, "when I am at a red light or a stop sign.  I feel like it helps prep me and give me a better session after I get here.  " OT to continue active stretching with incorporation of ASL and tendon glides bilateral until patient demonstrating full independence with notation that OT provided GEN SPV today's session.  OT noting improved posture, pacing, correction of forms.   TOTAL TIME:  15 MIN.   Patient engaging in UB ROM/endurance: MEDIUM  RESISTANCE 1 x 5 reps in sitting using written handout as visual aid.  Patient able to return demo the following:  Scissors spread, thumb press, thumb extension, thumb pinch  strengthening, thumb abduction, 3 jaw chuck pinch, finger hook, full grip, finger pinch, finger extension all digits left hand, finger scissor and finger spread.  OT providing Min visual/verbal/tactile cues to include Min assist as needed for formation and positioning of left hand, enhancing patient education, application for improved posture, pacing, hand ergonomics and correction of forms.  Patient reporting minimal fatigue that resolved post active stretching  Patient concluded session w/ AROM, and active stretching.  Continuing to progress towards all STG/LTG from POC.  TOTAL TIME: 25 MIN.   TOTAL TREATMENT TIME: 45 MINUTES  P:  Continue treatment per POC with follow-up appointment scheduled.  SHORT TERM GOALS TO BE MET WITHIN 4 WEEKS     1. PATIENT WILL RETURN DEMO: PACING, JP/BM, RETROGRADE/SCAR MASSAGE AS APPROPRIATE DURING CLINICAL ACTIVITIES 75% OR MORE OF THE TIME WITH MIN VC/DC/TC AND/OR ASSISTANCE FROM OT PROMOTING INCREASED PARTICIPATION AND INDEPENDENCE W/ SELF CARE, SOCIAL AND  COMMUNITY REINTEGRATION.  Met 07/17/2023 X 1.  Met 07/23/2023 X 2.  2. PATIENT WILL RETURN DEMO CORRECT HAND ERGONOMICS 75% OR MORE OF THE TIME TO PROMOTE DECREASING JOINT PAIN/FATIGUE FOR MORE PRODUCTIVE USE OF BUE'S WITH DAILY ROUTINES.  Met 07/17/2023 X 1.  Met 07/23/2023 X 2.  3. THROUGH PARTICIPATION IN GRADED UB/ROM/ENDURANCE AND GRADED THERAPEUTIC ACTIVITY: PATIENT WILL INCREASE :  GMC/FMC SKILLS DEMONSTRATING 75% ACCURACY 7/10 TRIALS,  FUNCTIONAL GRIP STRENGTH X 5 PSI, AND IN-HAND MANIPULATION INCREASING  OCCUPATIONAL PERFORMANCE WITH ALL DAILY LIFE ROUTINES.  Met 07/17/2023 X 1.    LONG TERM GOALS TO BE MET WITHIN 8 WEEKS  1. PATIENT WILL RETURN DEMO: PACING, JP/BM, RETROGRADE/SCAR MASSAGE AS APPROPRIATE DURING CLINICAL ACTIVITIES 90% OR MORE OF THE TIME MOD I PROMOTING INCREASED PARTICIPATION AND INDEPENDENCE W/ SELF CARE, SOCIAL AND  COMMUNITY REINTEGRATION.   2. PATIENT WILL RETURN DEMO CORRECT HAND ERGONOMICS 90% OR  MORE OF THE TIME MOD I FOR LIFT/CARRY/PUSH/PULL FOR MORE PRODUCTIVE USE OF BUE'S WITH DAILY ROUTINES W/ MAX PAIN REPORT 2/10.  3. THROUGH PARTICIPATION IN GRADED UB/ROM/ENDURANCE AND GRADED THERAPEUTIC ACTIVITY: PATIENT WILL INCREASE :  GMC/FMC SKILLS DEMONSTRATING 90% FOR MORE ACCURACY X 10 TRIALS,  FUNCTIONAL GRIP STRENGTH X 15 PSI, AND IN-HAND MANIPULATION INCREASING OCCUPATIONAL PERFORMANCE WITH ALL DAILY LIFE ROUTINES, JOB PERFORMANCE DUTIES.   P:  Continue POC with followed up appointments scheduled.     Total Session Time 45 and Timed code minutes 45  THERAPEUTIC ACTIVITY 45 minutes    Hillery Jacks, OTR/L 07/25/2023, 1:27 P.M.

## 2023-07-30 ENCOUNTER — Other Ambulatory Visit: Payer: Self-pay

## 2023-07-30 ENCOUNTER — Ambulatory Visit (HOSPITAL_COMMUNITY)
Admission: RE | Admit: 2023-07-30 | Discharge: 2023-07-30 | Disposition: A | Discharge status: Still In Hospital | Payer: Medicare Other | Source: Ambulatory Visit

## 2023-07-30 ENCOUNTER — Ambulatory Visit (INDEPENDENT_AMBULATORY_CARE_PROVIDER_SITE_OTHER): Payer: Self-pay | Admitting: OTOLARYNGOLOGY

## 2023-07-30 DIAGNOSIS — G5602 Carpal tunnel syndrome, left upper limb: Secondary | ICD-10-CM

## 2023-07-30 NOTE — OT Treatment (Signed)
[facility]  Outpatient Occupational Therapy  [address]  [place], [address]  [phone]  (Fax) [phone]    Occupational Therapy Treatment Note    Date: 07/30/2023  Patient's Name: Dawn Riley  Date of Birth: 01/25/1956  Occupational Therapy Visit  Visit 10/POC: 2 X WEEK X 8 WEEKS.   Next Physician Appointment  S:   Patient arrived to treatment session in good spirits. Patient reporting she is somewhat concerned expressing, "I a little sore in my forearms and at the base of my hand on my pinky side after I did the Thera Putty the 1st time.  I enjoyed using the Thera Putty and want to know if I can purchase for use at home with my other programs that I am already doing.  I wanted to show you that my stretching has improved in both arms by using my median and ulnar nerve glides.  "  O:  Patient began session with bilateral ASL, continuing session engaging in active tendon glides (median, ulnar) with OT providing GEN SPV for positioning as needed.  Patient engaging in UB ROM/endurance:  MEDIUM RESISTANCE THERA PUTTY 1 X 5 reps each hand using written handout as visual aid.  Patient concluded session w/ AROM, and active stretching.  OT reassessing MMT bilateral hands.  A:   Patient   session with bilateral ASL, continuing session engaging in active tendon glides (median, ulnar) with OT providing GEN SPV for positioning as needed.  OT reviewing hand ergonomics with patient today after observing hyper extension DIP joint of right thumb during therapeutic activity.  OT explaining how hyperextension increases pressure through MCP joint and having patient return demo thumb ABD/ADD with thumb aligned, DIP neutral and patient reporting decreased pain through MCP joint, acknowledging understanding of importance of positioning for pinch, grasp, release, lift/carry, push/pull to protect both hands/thumbs with all daily activities.  Patient reports that she enjoys doing her  active stretches with ASL while she is driving, "when I am at a red light or a stop sign.  I feel like it helps prep me and give me a better session after I get here.  " OT to continue active stretching with incorporation of ASL and tendon glides bilateral until patient demonstrating full independence with notation that OT provided GEN SPV today's session.  OT noting improved posture, pacing, correction of forms.   TOTAL TIME:  20 MIN.   Patient engaging in UB ROM/endurance: MEDIUM  RESISTANCE 1 x 5 reps in sitting using written handout as visual aid.  Patient able to return demo the following:  Scissors spread, thumb press, thumb extension, thumb pinch strengthening, thumb abduction, 3 jaw chuck pinch, finger hook, full grip, finger pinch, finger extension all digits left hand, finger scissor and finger spread.  OT providing Min visual/verbal/tactile cues to include Min assist as needed for formation and positioning of left hand, enhancing patient education, application for improved posture, pacing, hand ergonomics and correction of forms.  Patient reporting minimal fatigue that resolved post active stretching  Patient concluded session w/ AROM, and active stretching.  Continuing to progress towards all STG/LTG from POC.  OT reassessing MMT and grip strength with patient continuing to progress to LTG increasing grip strength 10 psi left hand, MMT thumb opposition, ABD/ADD 4/5, MP/PIP/DIP joint F/E, ABD/ADD digits 2-5 4/5.  TOTAL TIME: 25 MIN.   TOTAL TREATMENT TIME: 45 MINUTES  P:  Continue treatment per POC with follow-up appointment scheduled.  SHORT TERM  GOALS TO BE MET WITHIN 4 WEEKS     1. PATIENT WILL RETURN DEMO: PACING, JP/BM, RETROGRADE/SCAR MASSAGE AS APPROPRIATE DURING CLINICAL ACTIVITIES 75% OR MORE OF THE TIME WITH MIN VC/DC/TC AND/OR ASSISTANCE FROM OT PROMOTING INCREASED PARTICIPATION AND INDEPENDENCE W/ SELF CARE, SOCIAL AND  COMMUNITY REINTEGRATION.  Met 07/17/2023 X 1.  Met 07/23/2023 X 2.  2.  PATIENT WILL RETURN DEMO CORRECT HAND ERGONOMICS 75% OR MORE OF THE TIME TO PROMOTE DECREASING JOINT PAIN/FATIGUE FOR MORE PRODUCTIVE USE OF BUE'S WITH DAILY ROUTINES.  Met 07/17/2023 X 1.  Met 07/23/2023 X 2.  3. THROUGH PARTICIPATION IN GRADED UB/ROM/ENDURANCE AND GRADED THERAPEUTIC ACTIVITY: PATIENT WILL INCREASE :  GMC/FMC SKILLS DEMONSTRATING 75% ACCURACY 7/10 TRIALS,  FUNCTIONAL GRIP STRENGTH X 5 PSI, AND IN-HAND MANIPULATION INCREASING OCCUPATIONAL PERFORMANCE WITH ALL DAILY LIFE ROUTINES.  Met 07/17/2023 X 1.    LONG TERM GOALS TO BE MET WITHIN 8 WEEKS  1. PATIENT WILL RETURN DEMO: PACING, JP/BM, RETROGRADE/SCAR MASSAGE AS APPROPRIATE DURING CLINICAL ACTIVITIES 90% OR MORE OF THE TIME MOD I PROMOTING INCREASED PARTICIPATION AND INDEPENDENCE W/ SELF CARE, SOCIAL AND  COMMUNITY REINTEGRATION.   2. PATIENT WILL RETURN DEMO CORRECT HAND ERGONOMICS 90% OR MORE OF THE TIME MOD I FOR LIFT/CARRY/PUSH/PULL FOR MORE PRODUCTIVE USE OF BUE'S WITH DAILY ROUTINES W/ MAX PAIN REPORT 2/10.  3. THROUGH PARTICIPATION IN GRADED UB/ROM/ENDURANCE AND GRADED THERAPEUTIC ACTIVITY: PATIENT WILL INCREASE :  GMC/FMC SKILLS DEMONSTRATING 90% FOR MORE ACCURACY X 10 TRIALS,  FUNCTIONAL GRIP STRENGTH X 15 PSI, AND IN-HAND MANIPULATION INCREASING OCCUPATIONAL PERFORMANCE WITH ALL DAILY LIFE ROUTINES, JOB PERFORMANCE DUTIES.   P:  Continue POC with followed up appointments scheduled.     Total Session Time 45 and Timed code minutes 45  THERAPEUTIC ACTIVITY 45 minutes    Hillery Jacks, OTR/L 07/30/2023, 8:56 a.m.Marland Kitchen

## 2023-08-01 ENCOUNTER — Ambulatory Visit (HOSPITAL_COMMUNITY)
Admission: RE | Admit: 2023-08-01 | Discharge: 2023-08-01 | Disposition: A | Discharge status: Still In Hospital | Payer: Medicare Other | Source: Ambulatory Visit

## 2023-08-01 ENCOUNTER — Other Ambulatory Visit: Payer: Self-pay

## 2023-08-01 NOTE — OT Treatment (Signed)
[facility]  Outpatient Occupational Therapy  [address]  [place], [address]  [phone]  (Fax) [phone]    Occupational Therapy Treatment Note    Date: 08/01/2023  Patient's Name: Dawn Riley  Date of Birth: [birth year]  Occupational Therapy Visit  Visit 11/POC: 2 X WEEK X 8 WEEKS.   Next Physician Appointment  S:   Patient arrived to treatment session in good spirits. Patient reporting she is somewhat concerned expressing, "I am able to carry increased weight with less difficulty at home and my job.  I think the active stretching along with learning how to strengthen my body while protecting my hands continues to make a big difference.  I am looking forward to learning more.  I did have a HIDA scan recently which did show that my gallbladder because last year testing showed that it was functioning low.  I do not know my current results but my doctor (general surgeon) said that I am not symptomatic.  I think that is good news because it means right now that I do not have to prepare myself for surgery.  "  O:  Patient began session with bilateral ASL, continuing session engaging in active tendon glides (median, ulnar) with OT providing GEN SPV for positioning as needed.  Patient engaging in UB ROM/endurance:  MEDIUM RESISTANCE THERA PUTTY 1 X 5 reps each hand using written handout as visual aid.  Patient concluded session w/ AROM, and active stretching.  OT concluded with patient review of hand ergonomics for lift, carry, pour from coffee pot 75% full and patient return demo with OT providing Min verbal cues for posture, positioning of both hands.  A:   Patient   session with bilateral ASL, continuing session engaging in active tendon glides (median, ulnar) with OT providing GEN SPV for positioning as needed.  OT reviewing hand ergonomics with patient today after observing hyper extension DIP joint of right thumb during therapeutic activity.  OT explaining how  hyperextension increases pressure through MCP joint and having patient return demo thumb ABD/ADD with thumb aligned, DIP neutral and patient reporting decreased pain through MCP joint, acknowledging understanding of importance of positioning for pinch, grasp, release, lift/carry, push/pull to protect both hands/thumbs with all daily activities.   OT to continue active stretching with incorporation of ASL and tendon glides bilateral until patient demonstrating full independence with notation that OT provided GEN SPV today's session.  OT noting improved posture, pacing, correction of forms.   TOTAL TIME:  15 MIN.   Patient engaging in UB ROM/endurance: MEDIUM  RESISTANCE 1 x 5 reps in sitting using written handout as visual aid.  Patient able to return demo the following:  Scissors spread, thumb press, thumb extension, thumb pinch strengthening, thumb abduction, 3 jaw chuck pinch, finger hook, full grip, finger pinch, finger extension all digits left hand, finger scissor and finger spread.  OT providing Min visual/verbal/tactile cues to include Min assist as needed for formation and positioning of left hand, enhancing patient education, application for improved posture, pacing, hand ergonomics and correction of forms.  Patient reporting minimal fatigue that resolved post active stretching  Patient concluded session w/ AROM, and active stretching.  TOTAL TIME: 25 MIN.  OT concluded with patient review of hand ergonomics for lift, carry, pour from coffee pot 75% full and patient return demo with OT providing Min verbal cues for posture, positioning of both hands.  Patient demonstrating knowledge and application with repetition of trials for  lift, pouring coffee using 2 hands and positioning of right hand to decrease pressure through MCP joint of thumb.  OT also incorporating safety for patient to remember at home when lifting, carrying or pouring from containers that are round or sq in shape, how to position both hands  and use both hands, promoting joint protection, body mechanics, hand ergonomics.  Patient responding, "WOW, I did not think I was going to be able to pickup that copy pot at 1st, but realized how using both of my hands in the way I positioned my hands made such a difference!  I am going to share this information with other people I know who might have carpal tunnel or arthritis."  TOTAL TIME: 10 MIN.  TOTAL TREATMENT TIME: 45 MINUTES  P:  Continue treatment per CPOC with follow-up appointment scheduled.  SHORT TERM GOALS TO BE MET WITHIN 4 WEEKS     1. PATIENT WILL RETURN DEMO: PACING, JP/BM, RETROGRADE/SCAR MASSAGE AS APPROPRIATE DURING CLINICAL ACTIVITIES 75% OR MORE OF THE TIME WITH MIN VC/DC/TC AND/OR ASSISTANCE FROM OT PROMOTING INCREASED PARTICIPATION AND INDEPENDENCE W/ SELF CARE, SOCIAL AND  COMMUNITY REINTEGRATION.  Met 07/17/2023 X 1.  Met 07/23/2023 X 2.  Met 08/01/2023 X 3.  Discontinue and proceed toward LTG  2. PATIENT WILL RETURN DEMO CORRECT HAND ERGONOMICS 75% OR MORE OF THE TIME TO PROMOTE DECREASING JOINT PAIN/FATIGUE FOR MORE PRODUCTIVE USE OF BUE'S WITH DAILY ROUTINES.  Met 07/17/2023 X 1.  Met 07/23/2023 X 2.  Met 08/01/2023 X 3.  Discontinue and proceed toward LTG  3. THROUGH PARTICIPATION IN GRADED UB/ROM/ENDURANCE AND GRADED THERAPEUTIC ACTIVITY: PATIENT WILL INCREASE :  GMC/FMC SKILLS DEMONSTRATING 75% ACCURACY 7/10 TRIALS,  FUNCTIONAL GRIP STRENGTH X 5 PSI, AND IN-HAND MANIPULATION INCREASING OCCUPATIONAL PERFORMANCE WITH ALL DAILY LIFE ROUTINES.  Met 07/17/2023 X 1.  Discontinue and proceed toward LTG  LONG TERM GOALS TO BE MET WITHIN 8 WEEKS  1. PATIENT WILL RETURN DEMO: PACING, JP/BM, RETROGRADE/SCAR MASSAGE AS APPROPRIATE DURING CLINICAL ACTIVITIES 90% OR MORE OF THE TIME MOD I PROMOTING INCREASED PARTICIPATION AND INDEPENDENCE W/ SELF CARE, SOCIAL AND  COMMUNITY REINTEGRATION.  PROGRESSING  2. PATIENT WILL RETURN DEMO CORRECT HAND ERGONOMICS 90% OR MORE OF THE TIME MOD I FOR  LIFT/CARRY/PUSH/PULL FOR MORE PRODUCTIVE USE OF BUE'S WITH DAILY ROUTINES W/ MAX PAIN REPORT 2/10.  PROGRESSING  3. THROUGH PARTICIPATION IN GRADED UB/ROM/ENDURANCE AND GRADED THERAPEUTIC ACTIVITY: PATIENT WILL INCREASE :  GMC/FMC SKILLS DEMONSTRATING 90% FOR MORE ACCURACY X 10 TRIALS,  FUNCTIONAL GRIP STRENGTH X 15 PSI, AND IN-HAND MANIPULATION INCREASING OCCUPATIONAL PERFORMANCE WITH ALL DAILY LIFE ROUTINES, JOB PERFORMANCE DUTIES.   PROGRESSING  P:  Continue POC with followed up appointments scheduled.     Total Session Time 45 and Timed code minutes 45  THERAPEUTIC ACTIVITY 45 minutes    Hillery Jacks, OTR/L 08/01/2023, 8:55 a.m.Marland Kitchen

## 2023-08-06 ENCOUNTER — Ambulatory Visit (HOSPITAL_COMMUNITY)
Admission: RE | Admit: 2023-08-06 | Discharge: 2023-08-06 | Disposition: A | Discharge status: Still In Hospital | Payer: Medicare Other | Source: Ambulatory Visit

## 2023-08-06 ENCOUNTER — Other Ambulatory Visit: Payer: Self-pay

## 2023-08-06 DIAGNOSIS — G5602 Carpal tunnel syndrome, left upper limb: Secondary | ICD-10-CM

## 2023-08-06 NOTE — OT Treatment (Signed)
[facility]  Outpatient Occupational Therapy  [address]  [place], [address]  [phone]  (Fax) [phone]    Occupational Therapy Treatment Note    Date: 08/06/2023  Patient's Name: Dawn Riley  Date of Birth: 12-18-1955  Occupational Therapy Visit  Visit 12/POC: 2 X WEEK X 8 WEEKS.   Next Physician Appointment  S:   Patient arrived to treatment session in good spirits. Patient reporting she is somewhat concerned expressing, "I am able paying much more attention to how I use both of my hands for lifting, carrying, pushing, pulling, sliding, using my energy conservation strategies, using my hand ergonomics.  It really blue my mind the other day when we practiced pouring coffee.  I have shared the educational experience with family members on how much of a difference it made for me to feel more confident about myself, less afraid that I would spill something or burn myself and I am going to continue to practice everyday.  "  O:  Patient began session with bilateral ASL, continuing session engaging in active tendon glides (median, ulnar) with GEN SPV  for positioning as needed.  Patient engaging in UB ROM/endurance tolerating functional upgrade :  FIRM RESISTANCE THERA PUTTY 1 X 5 reps each hand using written handout as visual aid.  Patient concluded session w/ AROM, and active stretching.  OT concluded with patient review of hand ergonomics for lift, carry, pour from coffee pot 75% full and patient return demo with OT providing Min verbal cues for posture, positioning of both hands.  A:   Patient   session with bilateral ASL, continuing session engaging in active tendon glides (median, ulnar) with OT providing GEN SPV for positioning as needed.   Patient return demo increased awareness and application of thumb positioning for all active tendon glides, active stretch, avoiding hyperextension of DIP joint demonstrating improved positioning for pinch, grasp, release,  lift/carry, push/pull to protect both hands/thumbs during all clinical activity.   OT to continue active stretching with incorporation of ASL and tendon glides bilateral until patient demonstrating full independence with notation that OT provided GEN SPV today's session.  OT noting improved posture, pacing, correction of forms.   TOTAL TIME:  15 MIN.   Patient engaging in UB ROM/endurance: FIRM RESISTANCE 1 x 5 reps in sitting using written handout as visual aid.  Patient able to return demo the following:  Scissors spread, thumb press, thumb extension, thumb pinch strengthening, thumb abduction, 3 jaw chuck pinch, finger hook, full grip, finger pinch, finger extension all digits left hand, finger scissor and finger spread.  OT providing Min visual/verbal/tactile cues to include Min assist as needed for formation and positioning of left hand, enhancing patient education, application for improved posture, pacing, hand ergonomics and correction of forms.  Patient reporting minimal fatigue that resolved post active stretching  Patient concluded session w/ AROM, and active stretching.  TOTAL TIME: 25 MIN.  TOTAL TREATMENT TIME: 40 MINUTES  P:  Continue treatment per CPOC with follow-up appointment scheduled.  SHORT TERM GOALS TO BE MET WITHIN 4 WEEKS     1. PATIENT WILL RETURN DEMO: PACING, JP/BM, RETROGRADE/SCAR MASSAGE AS APPROPRIATE DURING CLINICAL ACTIVITIES 75% OR MORE OF THE TIME WITH MIN VC/DC/TC AND/OR ASSISTANCE FROM OT PROMOTING INCREASED PARTICIPATION AND INDEPENDENCE W/ SELF CARE, SOCIAL AND  COMMUNITY REINTEGRATION.  Met 07/17/2023 X 1.  Met 07/23/2023 X 2.  Met 08/01/2023 X 3.  Discontinue and proceed toward LTG  2. PATIENT  WILL RETURN DEMO CORRECT HAND ERGONOMICS 75% OR MORE OF THE TIME TO PROMOTE DECREASING JOINT PAIN/FATIGUE FOR MORE PRODUCTIVE USE OF BUE'S WITH DAILY ROUTINES.  Met 07/17/2023 X 1.  Met 07/23/2023 X 2.  Met 08/01/2023 X 3.  Discontinue and proceed toward LTG  3. THROUGH PARTICIPATION IN  GRADED UB/ROM/ENDURANCE AND GRADED THERAPEUTIC ACTIVITY: PATIENT WILL INCREASE :  GMC/FMC SKILLS DEMONSTRATING 75% ACCURACY 7/10 TRIALS,  FUNCTIONAL GRIP STRENGTH X 5 PSI, AND IN-HAND MANIPULATION INCREASING OCCUPATIONAL PERFORMANCE WITH ALL DAILY LIFE ROUTINES.  Met 07/17/2023 X 1.  Discontinue and proceed toward LTG  LONG TERM GOALS TO BE MET WITHIN 8 WEEKS  1. PATIENT WILL RETURN DEMO: PACING, JP/BM, RETROGRADE/SCAR MASSAGE AS APPROPRIATE DURING CLINICAL ACTIVITIES 90% OR MORE OF THE TIME MOD I PROMOTING INCREASED PARTICIPATION AND INDEPENDENCE W/ SELF CARE, SOCIAL AND  COMMUNITY REINTEGRATION.  Met 08/06/2023 X 1.  2. PATIENT WILL RETURN DEMO CORRECT HAND ERGONOMICS 90% OR MORE OF THE TIME MOD I FOR LIFT/CARRY/PUSH/PULL FOR MORE PRODUCTIVE USE OF BUE'S WITH DAILY ROUTINES W/ MAX PAIN REPORT 2/10.  PROGRESSING  3. THROUGH PARTICIPATION IN GRADED UB/ROM/ENDURANCE AND GRADED THERAPEUTIC ACTIVITY: PATIENT WILL INCREASE :  GMC/FMC SKILLS DEMONSTRATING 90% FOR MORE ACCURACY X 10 TRIALS,  FUNCTIONAL GRIP STRENGTH X 15 PSI, AND IN-HAND MANIPULATION INCREASING OCCUPATIONAL PERFORMANCE WITH ALL DAILY LIFE ROUTINES, JOB PERFORMANCE DUTIES.   PROGRESSING  P:  Continue CPOC with followed up appointments scheduled.     Total Session Time 40 and Timed code minutes 40  THERAPEUTIC ACTIVITY 40 minutes    Hillery Jacks, OTR/L 08/06/2023,  16:43 PM.

## 2023-08-09 ENCOUNTER — Ambulatory Visit
Admission: RE | Admit: 2023-08-09 | Discharge: 2023-08-09 | Disposition: A | Discharge status: Still In Hospital | Payer: Medicare Other | Source: Ambulatory Visit

## 2023-08-09 ENCOUNTER — Other Ambulatory Visit: Payer: Self-pay

## 2023-08-09 NOTE — OT Treatment (Signed)
[facility]  Outpatient Occupational Therapy  [address]  [place], [address]  [phone]  (Fax) [phone]    Occupational Therapy Treatment Note    Date: 08/09/2023  Patient's Name: Dawn Riley  Date of Birth: 04-05-1956  Occupational Therapy Visit  Visit 13 /POC: 2 X WEEK X 8 WEEKS.   Next Physician Appointment N/A  S:   Patient arrived to treatment session in good spirits. Patient reporting.  "I have a surprise for you! I brought my arthritis support gloves.  They arrived I believe on Wednesday of this week.  I have been wearing them since they came in the mail.  They really help to keep my hands warm and I feel good support around all of my job weight.  I have noticed that my thumbs do not seem to bother me when I am wearing them, which is a big plus when working at [employer] and at home.  "  O:  Patient began session with bilateral ASL, continuing session engaging in active tendon glides (median, ulnar) with GEN SPV  for positioning as needed.  Patient engaging in UB ROM/endurance tolerating functional upgrade :  15 LBS FLEX BAR  2 X 15 reps each hand using written handout as visual aid.  Patient concluded session w/ AROM, and active stretching.  A:   Patient   session with bilateral ASL, continuing session engaging in active tendon glides (median, ulnar) with OT providing GEN SPV for positioning as needed.   Patient return demo increased awareness and application of thumb positioning for all active tendon glides, active stretch, avoiding hyperextension of DIP joint demonstrating improved positioning for pinch, grasp, release, lift/carry, push/pull to protect both hands/thumbs during all clinical activity.   OT to continue active stretching with incorporation of ASL and tendon glides bilateral until patient demonstrating full independence with notation that OT provided GEN SPV today's session.  OT noting improved posture, pacing, correction of forms.   TOTAL TIME:   10 MIN.   Patient engaging in UB ROM/endurance TOLERATING FUNCTIONAL UPGRADE:   15 LBS FLEX BAR  2 X 15  reps in sitting using written handout as visual aid.  Patient able to return demo the following:  Grip strengthening, wrist F/E, UD/RD, pronation/supination, shoulder ABD/ADD, thumb ABD/ADD and flexion, elbow and shoulder oscillation shoulder 90/elbow full extension followed by oscillation with shoulder abducted 150, maintaining functional grasp and demonstrating appropriate hand ergonomics.  Patient concluding with hand soft tissue mobilization.  OT providing Min visual/verbal/tactile cues for formation and positioning of left hand, enhancing patient education, application for improved posture, pacing, hand ergonomics and correction of forms.  Patient reporting minimal fatigue that resolved post active stretching  Patient concluded session w/ AROM, and active stretching.  TOTAL TIME: 25 MIN.  Patient concluded session w/ AROM, and active stretching using written guide as visual aids for tendon glides, ASL at modified independent level.  TOTAL TIME: 8 MIN.  TOTAL TREATMENT TIME: 43 MINUTES  P:  Continue treatment per CPOC with follow-up appointment scheduled.  SHORT TERM GOALS TO BE MET WITHIN 4 WEEKS     1. PATIENT WILL RETURN DEMO: PACING, JP/BM, RETROGRADE/SCAR MASSAGE AS APPROPRIATE DURING CLINICAL ACTIVITIES 75% OR MORE OF THE TIME WITH MIN VC/DC/TC AND/OR ASSISTANCE FROM OT PROMOTING INCREASED PARTICIPATION AND INDEPENDENCE W/ SELF CARE, SOCIAL AND  COMMUNITY REINTEGRATION.  Met 07/17/2023 X 1.  Met 07/23/2023 X 2.  Met 08/01/2023 X 3.  Discontinue and proceed toward LTG  2. PATIENT WILL RETURN DEMO CORRECT HAND ERGONOMICS 75% OR MORE OF THE TIME TO PROMOTE DECREASING JOINT PAIN/FATIGUE FOR MORE PRODUCTIVE USE OF BUE'S WITH DAILY ROUTINES.  Met 07/17/2023 X 1.  Met 07/23/2023 X 2.  Met 08/01/2023 X 3.  Discontinue and proceed toward LTG  3. THROUGH PARTICIPATION IN GRADED UB/ROM/ENDURANCE AND GRADED  THERAPEUTIC ACTIVITY: PATIENT WILL INCREASE :  GMC/FMC SKILLS DEMONSTRATING 75% ACCURACY 7/10 TRIALS,  FUNCTIONAL GRIP STRENGTH X 5 PSI, AND IN-HAND MANIPULATION INCREASING OCCUPATIONAL PERFORMANCE WITH ALL DAILY LIFE ROUTINES.  Met 07/17/2023 X 1.  Discontinue and proceed toward LTG  LONG TERM GOALS TO BE MET WITHIN 8 WEEKS  1. PATIENT WILL RETURN DEMO: PACING, JP/BM, RETROGRADE/SCAR MASSAGE AS APPROPRIATE DURING CLINICAL ACTIVITIES 90% OR MORE OF THE TIME MOD I PROMOTING INCREASED PARTICIPATION AND INDEPENDENCE W/ SELF CARE, SOCIAL AND  COMMUNITY REINTEGRATION.  Met 08/06/2023 X 1.   2. PATIENT WILL RETURN DEMO CORRECT HAND ERGONOMICS 90% OR MORE OF THE TIME MOD I FOR LIFT/CARRY/PUSH/PULL FOR MORE PRODUCTIVE USE OF BUE'S WITH DAILY ROUTINES W/ MAX PAIN REPORT 2/10.  PROGRESSING  3. THROUGH PARTICIPATION IN GRADED UB/ROM/ENDURANCE AND GRADED THERAPEUTIC ACTIVITY: PATIENT WILL INCREASE :  GMC/FMC SKILLS DEMONSTRATING 90% FOR MORE ACCURACY X 10 TRIALS,  FUNCTIONAL GRIP STRENGTH X 15 PSI, AND IN-HAND MANIPULATION INCREASING OCCUPATIONAL PERFORMANCE WITH ALL DAILY LIFE ROUTINES, JOB PERFORMANCE DUTIES.   PROGRESSING  P:  Continue CPOC with followed up appointments scheduled.     Total Session Time 43 and Timed code minutes 43  THERAPEUTIC ACTIVITY 43 minutes    Hillery Jacks, OTR/L 08/09/2023,  8:49 AM

## 2023-08-12 ENCOUNTER — Ambulatory Visit (HOSPITAL_COMMUNITY): Payer: Self-pay

## 2023-08-14 ENCOUNTER — Other Ambulatory Visit: Payer: Self-pay

## 2023-08-14 ENCOUNTER — Ambulatory Visit (HOSPITAL_COMMUNITY)
Admission: RE | Admit: 2023-08-14 | Discharge: 2023-08-14 | Disposition: A | Discharge status: Still In Hospital | Payer: Medicare Other | Source: Ambulatory Visit

## 2023-08-14 DIAGNOSIS — G5602 Carpal tunnel syndrome, left upper limb: Secondary | ICD-10-CM

## 2023-08-14 NOTE — OT Treatment (Signed)
[facility]  Outpatient Occupational Therapy  [address]  [place], [address]  [phone]  (Fax) [phone]    Occupational Therapy Treatment Note    Date: 08/14/2023  Patient's Name: Dawn Riley  Date of Birth: 1956/02/04  Occupational Therapy Visit  Visit 14 /POC: 2 X WEEK X 8 WEEKS.   Next Physician Appointment N/A  S:   Patient arrived to treatment session at 8:03 a.m. in good spirits. Patient reporting.  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  "  O:  Patient began session  with application and return demo joint protection, body mechanics and energy conservation/work simplification techniques.  Patient concluded session w/ AROM, and active stretching.  A:   Patient   began with application and return demo joint protection, body mechanics and energy conservation/work simplification techniques related to push/pull, lift/carry, cleaning overhead, at waist level and below waist level promoting positioning of hands, hand ergonomics, reduce unnecessary bending twisting.  OT providing tubing grip to aid grip for dry mop/swiffer.  Patient return demo good hand positioning with OT providing review/education to walk with object when using as a cleaning Tool, repositioned hands as needed to reduce stress on shoulders and arms when working overhead, at waist level and below waist level including using gravity to assist movement.  Patient continued with application related to job performance duties using bedside activity table, hook grip, hands in pronation on front and sides, keeping back straight and using weight of her body to slide table toward herself, in front of herself, L/R sides.  Patient demonstrating improved awareness and application for positioning of thumbs to reduce stress on MCP joints, promote hand ergonomics and functional grip  while performing all activities.  Patient stating, "I can not believe how much of a difference my body and my arms feel just by using a few simplification techniques.  Everybody should be doing this!  I know I am guilty of just doing the job in front of me without thinking about how it is affecting my body or my hands.  I am really changing my mind set because I realize that I do not have to physically be in pain or fatigued if I just take the time to apply techniques I used in session today.  It may take just a little extra time but I am decreasing wear and tear on my hands, shoulders, back, legs and feet.  I am definitely going to remember this and continue to use these techniques!"  Patient concluded utilizing joint protection, energy conservation and hand ergonomics to tip vacuum cleaner back, hold hand by her side, roll vacuum cleaner out of, into closet storage area reporting no stress on her hand (S) for bilateral shoulders.  Patient was able to stand 38 minutes while demonstrating all techniques related to job performance duties, IADLs with OT providing GEN SPV, educational review while patient completing entirety of therapeutic activity with improved application and understanding of all techniques utilized in today's session.  TOTAL TIME:  38 MIN.   Patient concluding today's session with active stretch, tendon glides and ASL bilateral X 3 trials.   Patient return demo increased awareness and application of thumb positioning for all active tendon glides, active stretch, avoiding hyperextension of DIP joint demonstrating independence, no verbal cues from OT required for  active and successful completion.  TOTAL TIME:  8 MIN.   TOTAL TREATMENT TIME: 46 MIN  P:  Continue treatment per CPOC with follow-up appointment scheduled.  SHORT TERM GOALS TO BE MET WITHIN 4 WEEKS     1. PATIENT WILL RETURN DEMO: PACING, JP/BM, RETROGRADE/SCAR MASSAGE AS APPROPRIATE DURING CLINICAL ACTIVITIES 75% OR MORE OF THE TIME WITH  MIN VC/DC/TC AND/OR ASSISTANCE FROM OT PROMOTING INCREASED PARTICIPATION AND INDEPENDENCE W/ SELF CARE, SOCIAL AND  COMMUNITY REINTEGRATION.  Met 07/17/2023 X 1.  Met 07/23/2023 X 2.  Met 08/01/2023 X 3.  Discontinue and proceed toward LTG  2. PATIENT WILL RETURN DEMO CORRECT HAND ERGONOMICS 75% OR MORE OF THE TIME TO PROMOTE DECREASING JOINT PAIN/FATIGUE FOR MORE PRODUCTIVE USE OF BUE'S WITH DAILY ROUTINES.  Met 07/17/2023 X 1.  Met 07/23/2023 X 2.  Met 08/01/2023 X 3.  Discontinue and proceed toward LTG  3. THROUGH PARTICIPATION IN GRADED UB/ROM/ENDURANCE AND GRADED THERAPEUTIC ACTIVITY: PATIENT WILL INCREASE :  GMC/FMC SKILLS DEMONSTRATING 75% ACCURACY 7/10 TRIALS,  FUNCTIONAL GRIP STRENGTH X 5 PSI, AND IN-HAND MANIPULATION INCREASING OCCUPATIONAL PERFORMANCE WITH ALL DAILY LIFE ROUTINES.  Met 07/17/2023 X 1.  Discontinue and proceed toward LTG  LONG TERM GOALS TO BE MET WITHIN 8 WEEKS  1. PATIENT WILL RETURN DEMO: PACING, JP/BM, RETROGRADE/SCAR MASSAGE AS APPROPRIATE DURING CLINICAL ACTIVITIES 90% OR MORE OF THE TIME MOD I PROMOTING INCREASED PARTICIPATION AND INDEPENDENCE W/ SELF CARE, SOCIAL AND  COMMUNITY REINTEGRATION.  Met 08/06/2023 X 1.  Met 08/14/2023 X 2.  2. PATIENT WILL RETURN DEMO CORRECT HAND ERGONOMICS 90% OR MORE OF THE TIME MOD I FOR LIFT/CARRY/PUSH/PULL FOR MORE PRODUCTIVE USE OF BUE'S WITH DAILY ROUTINES W/ MAX PAIN REPORT 2/10.  Met 08/14/2023 X 1.  3. THROUGH PARTICIPATION IN GRADED UB/ROM/ENDURANCE AND GRADED THERAPEUTIC ACTIVITY: PATIENT WILL INCREASE :  GMC/FMC SKILLS DEMONSTRATING 90% FOR MORE ACCURACY X 10 TRIALS,  FUNCTIONAL GRIP STRENGTH X 15 PSI, AND IN-HAND MANIPULATION INCREASING OCCUPATIONAL PERFORMANCE WITH ALL DAILY LIFE ROUTINES, JOB PERFORMANCE DUTIES.   PROGRESSING  P:  Continue CPOC with followed up appointments scheduled.     Total Session Time 46 and Timed code minutes 46  THERAPEUTIC ACTIVITY 46 minutes    Hillery Jacks, OTR/L 08/14/2023, 9:17 a.m.

## 2023-08-16 ENCOUNTER — Ambulatory Visit (HOSPITAL_COMMUNITY): Payer: Self-pay

## 2023-08-16 DIAGNOSIS — K8020 Calculus of gallbladder without cholecystitis without obstruction: Secondary | ICD-10-CM | POA: Insufficient documentation

## 2023-08-19 ENCOUNTER — Other Ambulatory Visit: Payer: Self-pay

## 2023-08-19 ENCOUNTER — Ambulatory Visit (HOSPITAL_COMMUNITY)
Admission: RE | Admit: 2023-08-19 | Discharge: 2023-08-19 | Disposition: A | Discharge status: Still In Hospital | Payer: Medicare Other | Source: Ambulatory Visit

## 2023-08-19 DIAGNOSIS — G5602 Carpal tunnel syndrome, left upper limb: Secondary | ICD-10-CM

## 2023-08-19 NOTE — OT Treatment (Signed)
[facility]  Outpatient Occupational Therapy  [address]  [place], [address]  [phone]  (Fax) [phone]    Occupational Therapy Treatment Note    Date: 08/19/2023  Patient's Name: Dawn Riley  Date of Birth: 06/29/1956  Occupational Therapy Visit  Visit 15 /POC: 2 X WEEK X 8 WEEKS.   Next Physician Appointment N/A  S:   Patient arrived to treatment session at 8:00 a.m. in good spirits. Patient reporting.  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 "  O:  Patient began session  with active stretch, tendon glides and ASL. Continued and concluded w/ UB ROM/endurance: Extra Firm Theraputty w/ written guide as visual aid.   A:   Patient   began   session  with active stretch, tendon glides and ASL.  Patient able to return demo good awareness of body mechanics, hand ergonomics, posture, pacing and positioning during bilateral movement without use of written guide as visual aids completing entirety of program X 12 minutes modified independent.  TOTAL TIME:  12 MIN.  Continued and concluded w/ UB ROM/endurance:  Firm and Extra Firm Theraputty w/ written guide as visual aid.  Patient able to return demo 1 x 3 reps each hand using firm and extra firm Thera Putty/bilateral using written guide as visual aid:  Scissors spread, thumb press, thumb extension, thumb pinch strengthening, thumb abduction, 3 jaw chuck pinch, finger hook, full grip, finger pinch, finger extension all digits both hands, finger scissor and finger spread.  Patient enquiring from OT maintenance level of therapy that she would like to use at home.  OT inpatient reviewing together via Internet resources for patient to Select from OT recommendations (firm/extra firm 6 oz Thera Putty) based on pricing, patient preference.  Patient  thanking OT for extra time, education and information regarding all programs as she is preparing for discharge on her next visit.  TOTAL TIME: 23 MIN.  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  OT showing patient options, pricing, providing education for temperature of wax, how to utilize at home, pricing options and options for home use such as using crab pot with liner, paraffin wax, cooking thermometer to make sure wax is 126-132 degrees.  Patient demonstrating modified independence/independence with home programs utilized to date.  TOTAL TIME: 8 MIN.  TOTAL TREATMENT TIME: 43 MIN.    SHORT TERM GOALS TO BE MET WITHIN 4 WEEKS     1. PATIENT WILL RETURN DEMO: PACING, JP/BM, RETROGRADE/SCAR MASSAGE AS APPROPRIATE DURING CLINICAL ACTIVITIES 75% OR MORE OF THE TIME WITH MIN VC/DC/TC AND/OR ASSISTANCE FROM OT PROMOTING INCREASED PARTICIPATION AND INDEPENDENCE W/ SELF CARE, SOCIAL AND  COMMUNITY REINTEGRATION.  Met 07/17/2023 X 1.  Met 07/23/2023 X 2.  Met 08/01/2023 X 3.  Discontinue and proceed toward LTG  2. PATIENT WILL RETURN DEMO CORRECT HAND ERGONOMICS 75% OR MORE OF THE TIME TO PROMOTE DECREASING JOINT PAIN/FATIGUE FOR MORE PRODUCTIVE USE OF BUE'S WITH DAILY ROUTINES.  Met 07/17/2023 X 1.  Met 07/23/2023 X 2.  Met 08/01/2023 X 3.  Discontinue and proceed toward LTG  3. THROUGH PARTICIPATION IN GRADED UB/ROM/ENDURANCE AND GRADED THERAPEUTIC ACTIVITY: PATIENT WILL INCREASE :  GMC/FMC  SKILLS DEMONSTRATING 75% ACCURACY 7/10 TRIALS,  FUNCTIONAL GRIP STRENGTH X 5 PSI, AND IN-HAND MANIPULATION INCREASING OCCUPATIONAL PERFORMANCE WITH ALL DAILY LIFE ROUTINES.  Met 07/17/2023 X 1.  Discontinue and proceed toward LTG  LONG TERM GOALS TO BE MET WITHIN 8 WEEKS  1. PATIENT WILL RETURN DEMO: PACING, JP/BM, RETROGRADE/SCAR MASSAGE AS APPROPRIATE DURING CLINICAL ACTIVITIES 90% OR MORE OF THE TIME MOD I PROMOTING  INCREASED PARTICIPATION AND INDEPENDENCE W/ SELF CARE, SOCIAL AND  COMMUNITY REINTEGRATION.  Met 08/06/2023 X 1.  Met 08/14/2023 X 2.  2. PATIENT WILL RETURN DEMO CORRECT HAND ERGONOMICS 90% OR MORE OF THE TIME MOD I FOR LIFT/CARRY/PUSH/PULL FOR MORE PRODUCTIVE USE OF BUE'S WITH DAILY ROUTINES W/ MAX PAIN REPORT 2/10.  Met 08/14/2023 X 1.  Met 08/19/2023 X 2.  3. THROUGH PARTICIPATION IN GRADED UB/ROM/ENDURANCE AND GRADED THERAPEUTIC ACTIVITY: PATIENT WILL INCREASE :  GMC/FMC SKILLS DEMONSTRATING 90% FOR MORE ACCURACY X 10 TRIALS,  FUNCTIONAL GRIP STRENGTH X 15 PSI, AND IN-HAND MANIPULATION INCREASING OCCUPATIONAL PERFORMANCE WITH ALL DAILY LIFE ROUTINES, JOB PERFORMANCE DUTIES.   PROGRESSING  P:  Continue CPOC with plan to discharge scheduled next visit.     Total Session Time 43 and Timed code minutes 43  THERAPEUTIC ACTIVITY 43 minutes    Hillery Jacks, OTR/L 08/19/2023, 9:13 a.m.

## 2023-08-21 ENCOUNTER — Other Ambulatory Visit: Payer: Self-pay

## 2023-08-21 ENCOUNTER — Ambulatory Visit (INDEPENDENT_AMBULATORY_CARE_PROVIDER_SITE_OTHER): Payer: Self-pay | Admitting: Surgery

## 2023-08-21 ENCOUNTER — Encounter (INDEPENDENT_AMBULATORY_CARE_PROVIDER_SITE_OTHER): Payer: Self-pay | Admitting: Surgery

## 2023-08-21 VITALS — BP 137/76 | HR 66 | Ht 64.0 in | Wt 239.0 lb

## 2023-08-21 DIAGNOSIS — M25561 Pain in right knee: Secondary | ICD-10-CM | POA: Insufficient documentation

## 2023-08-21 DIAGNOSIS — K432 Incisional hernia without obstruction or gangrene: Secondary | ICD-10-CM | POA: Insufficient documentation

## 2023-08-21 DIAGNOSIS — M12819 Other specific arthropathies, not elsewhere classified, unspecified shoulder: Secondary | ICD-10-CM | POA: Insufficient documentation

## 2023-08-21 DIAGNOSIS — K828 Other specified diseases of gallbladder: Secondary | ICD-10-CM

## 2023-08-21 DIAGNOSIS — Z8719 Personal history of other diseases of the digestive system: Secondary | ICD-10-CM

## 2023-08-21 DIAGNOSIS — R109 Unspecified abdominal pain: Secondary | ICD-10-CM

## 2023-08-21 DIAGNOSIS — R1033 Periumbilical pain: Secondary | ICD-10-CM

## 2023-08-21 NOTE — H&P (Signed)
GENERAL SURGERY, [facility]  [address]  [place] [address]    History and Physical    Name: Dawn Riley MRN:  [id number]   Date: 08/21/2023 DOB:  1956/05/02 (67 y.o.)           H&P    Referring Provider: No ref. provider found     Chief Complaint:  New Patient (Patient referred from [facility] curry for cholelithiasis w/o obstruction /HIDA done 07/31/2023 )       HPI:  Patient is referred for the evaluation and treatment of right upper quadrant pain where her HIDA scan showing ejection fraction of 16% reported to have cholelithiasis however I do not see any sonogram in the records received as well as in the [facility] records.    Patient does not remember having any abdominal ultrasound for the right upper quadrant pain.  She claimed that she was on Ozempic last year and had some right upper quadrant flank pain when ejection fraction was done with a HIDA scan that was low so she was taken off Ozempic know the current test was repeated on November 20th of this year showing 60% ejection fraction.    Patient claimed that she gets occasional heartburn and takes over-the-counter medications otherwise she does not get any nausea vomiting or intolerance to any kind of food including greasy food.  No bowel symptoms no GI bleeding or any history suggestive of jaundice.    Sometimes she gets right flank pain and back pain which is intermittent but currently she does not have any urinary complaints.    She claimed that she had emergency operation for incarcerated ventral hernia in the early part of COVID that came back now she gets occasional pain in the umbilical area and feels a lump but does not give any symptoms suggestive of bowel obstruction at any time.  She claimed that mesh was not used.  Those records are not present in the current system but I am going to get it and cut those    ROS  No transient ischemic attacks.  No active chest pains or dyspnea no cough or hemoptysis no palpitations.  No claudication or  other complaints    Past Medical History:  History reviewed. No pertinent past medical history.  Past Medical History was reviewed and is negative for other than what is mentioned above    Past Surgical History was reviewed and is negative for except ventral hernia repair and knee surgery      Family Medical History:    None          Social History     Tobacco Use    Smoking status: Never    Smokeless tobacco: Never        Allergies:  Allergies   Allergen Reactions    Sulfa (Sulfonamides)      Other Reaction(s): other    Sulfamethoxazole  Other Adverse Reaction (Add comment)     Patient had numbness in her extremities    Azelastine Mental Status Effect     Felt anxious and could not sleep.        Medications:  Current Outpatient Medications   Medication Sig    ammonium lactate (GERI-HYDROLAC) 12 % Cream Apply topically Twice daily    azelastine (ASTELIN) 137 mcg (0.1 %) Nasal Aerosol, Spray Administer 2 Sprays into each nostril Every evening Use in each nostril as directed    cholecalciferol, vitamin D3, 1,250 mcg (50,000 unit) Oral Capsule Take 1 Capsule (50,000  Units total) by mouth Every 7 days    FLUoxetine (PROZAC) 10 mg Oral Capsule Take 1 Capsule (10 mg total) by mouth Once a day    fluticasone propionate (FLONASE) 50 mcg/actuation Nasal Spray, Suspension Administer 2 Sprays into each nostril Once a day    loratadine (CLARITIN) 5 mg/5 mL Oral Solution Take 10 mL (10 mg total) by mouth    LORazepam (ATIVAN) 0.5 mg Oral Tablet lorazepam 0.5 mg tablet   TAKE 1 TABLET BY MOUTH ONCE DAILY    urea 20 % Cream Apply topically Once a day        Physical Exam:    Objective:  Vital: BP 137/76 (Site: Left Arm, Patient Position: Sitting, Cuff Size: Adult)   Pulse 66   Ht 1.626 m (5\' 4" )   Wt 108 kg (239 lb)   SpO2 97%   BMI 41.02 kg/m     Patient is fully awake and alert and fully oriented and in no distress.  Vital signs are stable.  She is overweight.  There is no scleral icterus  Neck supple   Lungs are clear    Heart sounds normal  Abdomen is soft protuberant but there is no epigastric or right upper quadrant tenderness.  Surgical scar is noted with a small reducible lump just below the umbilicus.  There is no ankle edema or calf tenderness.      [facility] records including imaging studies reviewed.  Records from the PCP also reviewed.  Her CBC and blood chemistry done in October of this year was quite unremarkable.    Assessment and Plan:    ICD-10-CM    1. 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 Right flank pain  R10.9         Detailed discussion was done with the patient.  I will get a complete abdominal sonogram particularly to look in the right kidney gallbladder and the ventral hernia.    She will return after that for further evaluation.  Follow Up:  No follow-ups on file.    Olevia Perches, MD

## 2023-08-22 ENCOUNTER — Ambulatory Visit
Admission: RE | Admit: 2023-08-22 | Discharge: 2023-08-22 | Disposition: A | Discharge status: Still In Hospital | Payer: Medicare Other | Source: Ambulatory Visit

## 2023-08-26 ENCOUNTER — Ambulatory Visit: Payer: Medicare Other | Attending: OTOLARYNGOLOGY | Admitting: OTOLARYNGOLOGY

## 2023-08-26 ENCOUNTER — Other Ambulatory Visit: Payer: Self-pay

## 2023-08-26 ENCOUNTER — Encounter (INDEPENDENT_AMBULATORY_CARE_PROVIDER_SITE_OTHER): Payer: Self-pay | Admitting: OTOLARYNGOLOGY

## 2023-08-26 VITALS — Ht 64.0 in | Wt 239.0 lb

## 2023-08-26 DIAGNOSIS — J309 Allergic rhinitis, unspecified: Secondary | ICD-10-CM | POA: Insufficient documentation

## 2023-08-26 DIAGNOSIS — H9313 Tinnitus, bilateral: Secondary | ICD-10-CM | POA: Insufficient documentation

## 2023-08-26 DIAGNOSIS — H6120 Impacted cerumen, unspecified ear: Secondary | ICD-10-CM | POA: Insufficient documentation

## 2023-08-26 DIAGNOSIS — R42 Dizziness and giddiness: Secondary | ICD-10-CM | POA: Insufficient documentation

## 2023-08-26 DIAGNOSIS — H6993 Unspecified Eustachian tube disorder, bilateral: Secondary | ICD-10-CM | POA: Insufficient documentation

## 2023-08-26 DIAGNOSIS — H6123 Impacted cerumen, bilateral: Secondary | ICD-10-CM

## 2023-08-26 MED ORDER — CETIRIZINE 10 MG TABLET
10.0000 mg | ORAL_TABLET | Freq: Every evening | ORAL | 2 refills | Status: AC
Start: 2023-08-26 — End: ?

## 2023-08-26 MED ORDER — FLUTICASONE PROPIONATE 50 MCG/ACTUATION NASAL SPRAY,SUSPENSION
2.0000 | Freq: Every day | NASAL | 11 refills | Status: DC
Start: 2023-08-26 — End: 2024-01-29

## 2023-08-26 NOTE — H&P (Signed)
[facility]  [address]  [place] [address]  Operated by [facility]  Return Patient Visit    Name: ELYSSE Riley MRN:  [id number]   Date: 08/26/2023 DOB: June 27, 1956 (67 y.o.)       Referring Provider:  No ref. provider found    Reason for Visit:   Chief Complaint   Patient presents with    Ear Problem(s)     Complains of recurrent tinnitus.        History of Present Illness:  Dawn Riley is a 67 y.o. female who is FU on AR and dizziness. She questions if her bifocals make her feel dizzy and lasts seconds at a time. Using Flonase PRN. No change in tinnitus. Denies rotary vertigo.     Patient History:  Patient Active Problem List   Diagnosis    Anxiety    Arthritis of carpometacarpal (CMC) joint of right thumb    Atrial fibrillation (CMS HCC)    Attention deficit hyperactivity disorder (ADHD)    Left carpal tunnel syndrome    Chest pain    Chronic pain of both shoulders    DDD (degenerative disc disease), lumbar    Disorder of bone density and structure, unspecified    HLD (hyperlipidemia)    Hyperglycemia    Impingement syndrome of shoulder region    Morton metatarsalgia    Neck pain    Nontraumatic complete tear of left rotator cuff    Obesity    Sleep apnea    Ovarian cyst    Osteoarthritis of spine with radiculopathy, cervical region    Palpitations    Postmenopause    Primary osteoarthritis of both knees    PVCs (premature ventricular contractions)    Rotator cuff tear, right    Status post total left knee replacement using cement    Tachycardia    Tear of left supraspinatus tendon    Mitral valve regurgitation    Pain in right shoulder    Low back pain    Osteoarthritis of knee    Vitamin D3 deficiency    Rotator cuff tear arthropathy    Right flank pain    Near syncope    Pain in right knee    Nonalcoholic fatty liver    Inflammation at injection site    Idiopathic peripheral neuropathy    Cholelithiasis without obstruction    Dysfunctional gallbladder    Recurrent ventral  hernia     Current Outpatient Medications   Medication Sig    ammonium lactate (GERI-HYDROLAC) 12 % Cream Apply topically Twice daily    azelastine (ASTELIN) 137 mcg (0.1 %) Nasal Aerosol, Spray Administer 2 Sprays into each nostril Every evening Use in each nostril as directed    cetirizine (ZYRTEC) 10 mg Oral Tablet Take 1 Tablet (10 mg total) by mouth Every evening    cholecalciferol, vitamin D3, 1,250 mcg (50,000 unit) Oral Capsule Take 1 Capsule (50,000 Units total) by mouth Every 7 days    FLUoxetine (PROZAC) 10 mg Oral Capsule Take 1 Capsule (10 mg total) by mouth Once a day    fluticasone propionate (FLONASE) 50 mcg/actuation Nasal Spray, Suspension Administer 2 Sprays into each nostril Once a day    loratadine (CLARITIN) 5 mg/5 mL Oral Solution Take 10 mL (10 mg total) by mouth    LORazepam (ATIVAN) 0.5 mg Oral Tablet lorazepam 0.5 mg tablet   TAKE 1 TABLET BY MOUTH ONCE DAILY    urea 20 % Cream  Apply topically Once a day      Allergies   Allergen Reactions    Sulfa (Sulfonamides)      Other Reaction(s): other    Sulfamethoxazole  Other Adverse Reaction (Add comment)     Patient had numbness in her extremities    Azelastine Mental Status Effect     Felt anxious and could not sleep.     History reviewed. No pertinent past medical history.  Past Surgical History:   Procedure Laterality Date    COLONOSCOPY      HX HERNIA REPAIR      HX KNEE REPLACMENT       Family Medical History:    None         Social History     Tobacco Use    Smoking status: Never    Smokeless tobacco: Never   Vaping Use    Vaping status: Never Used       Review of Systems:  Review of Systems    Physical Exam:  Ht 1.626 m (5\' 4" )   Wt 108 kg (239 lb)   BMI 41.02 kg/m       ENT Physical Exam  Constitutional  Appearance: patient appears well-developed, well-nourished and well-groomed,  Communication/Voice: communication appropriate for developmental age; vocal quality normal;  Head and Face  Appearance: head appears normal, face appears  normal and face appears atraumatic;  Palpation: facial palpation normal;  Salivary: glands normal;  Ear  Hearing: intact;  Auricles: right auricle normal; left auricle normal;  External Mastoids: right external mastoid normal; left external mastoid normal;  Ear Canals: bilateral ear canals impacted cerumen observed;  Tympanic Membranes: right tympanic membrane normal; left tympanic membrane normal;  Nose  External Nose: nares patent bilaterally; external nose normal;  Internal Nose: bilateral intranasal mucosa edematous; nasal septal deviation present; bilateral inferior turbinates with hypertrophy;  Oral Cavity/Oropharynx  Lips: normal;  Teeth: normal;  Gums: gingiva normal;  Tongue: normal;  Oral mucosa: normal;  Hard palate: normal;  Neck  Neck: neck normal; neck palpation normal;  Thyroid: thyroid normal;  Respiratory  Inspection: breathing unlabored; normal breathing rate;  Lymphatic  Palpation: lymph nodes normal;  Neurovestibular  Mental Status: alert and oriented;  Psychiatric: mood normal; affect is appropriate;  Cranial Nerves: cranial nerves intact;       Assessment:  ENCOUNTER DIAGNOSES     ICD-10-CM   1. 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 ETD (Eustachian tube dysfunction), bilateral  H69.93   3. Chronic allergic rhinitis  J30.9   4. Dizziness and giddiness  R42   5. Impacted cerumen, unspecified laterality  H61.20       Plan:  Medical records reviewed on 08/26/2023.  Cont Flonase and zyrtec. Stable. Cerumen debrided.   Orders Placed This Encounter    [phone] - NASAL ENDOSCOPY DIAGNOSTIC UNILATERAL OR BILATERAL (AMB ONLY)    56213 - REMOVAL IMPACTED CERUMEN W/ INSTRUMENT, UNILATERAL (AMB ONLY-PD)    POCT HEARING/VISION/TYMPANOGRAM (AMB ONLY)    fluticasone propionate (FLONASE) 50 mcg/actuation Nasal Spray, Suspension    cetirizine (ZYRTEC) 10 mg Oral Tablet     Return in about 6 months (around 02/24/2024).    Marcelline Deist, PA-C  The advanced practice clinician's documentation was reviewed/amended in  its entirety with the assessment and plan portion completely performed independently by me during this separate encounter.

## 2023-08-26 NOTE — Procedures (Signed)
[facility]  [address]  [place] [address]  Operated by [facility]  Procedure Note    Name: HERTA LEUENBERGER MRN:  [id number]   Date: 08/26/2023 DOB:  1956/03/16 (67 y.o.)         31231 - NASAL ENDOSCOPY DIAGNOSTIC UNILATERAL OR BILATERAL (AMB ONLY)    Performed by: Conchita Paris, DO  Authorized by: Conchita Paris, DO    Time Out:     Immediately before the procedure, a time out was called:  Yes    Patient verified:  Yes    Procedure Verified:  Yes    Site Verified:  Yes  Documentation:      [facility]  [address]  [place] [address]  Operated by [facility]  Procedure Note    Name: BREAUNA ARIMA MRN:  [id number]  Date: 08/26/2023 DOB:  May 24, 1956 (67 y.o.)        @PROCDOC @    Indications for procedure: Monitor chronic disease    Anesthesia: Oxymetazoline nasal spray    Description: Nasal endoscopy with rigid scope was performed with examination of the  septum, inferior, middle, and superior meatus, turbinates, sphenoethmoidal recess, and nasopharynx.     There were no polyps, pus, or granulation tissue noted.  ET orifices and nasopharynx were normal.     Findings: Allergic rhinitis    The patient tolerated the procedure well.    [address] [place], [place]         [address] - REMOVAL IMPACTED CERUMEN W/ INSTRUMENT, UNILATERAL (AMB ONLY-PD)    Performed by: Conchita Paris, DO  Authorized by: Conchita Paris, DO    Time Out:     Immediately before the procedure, a time out was called:  Yes    Patient verified:  Yes    Procedure Verified:  Yes    Site Verified:  Yes  Documentation:      Procedure: Cerumen cleaning  Pre-op Dx: Cerumen impaction      Bilateral EAC(s) examined under binocular microscopy.  Cerumen and/or debris was cleaned from the canal(s) using curettes, suction, and alligator forceps.  Patient tolerated procedure well.        Conchita Paris, DO

## 2023-09-03 NOTE — OT Treatment (Signed)
[facility]  Outpatient Occupational Therapy  [address]  [place], [address]  [phone]  (Fax) [phone]    Occupational Therapy Treatment Note    Date: 09/03/2023  Patient's Name: Dawn Riley  Date of Birth: [birth year]  Occupational Therapy Visit  Visit 16 /POC: 2 X WEEK X 8 WEEKS.   Next Physician Appointment N/A                 OCCUPATIONAL THERAPY DISCHARGE SUMMARY  S:   Patient arrived to treatment session at 4:00 P.M. in good spirits. Patient reporting.  "I feel good about my discharge today.  I am excited to work with the max resist Thera Putty, find out how much stronger my grip strength is and anything else you want me to do, I am ready.  I have really enjoyed occupational therapy and learned so much more about myself, my whole-body and how to take care of my whole-body at home and with job related activities, work smarter not harder and just have a very different perspective of how much I can accomplish now that I have the right knowledge.  "  O:  Patient completed active stretch, tendon glides and ASL independently at home. Continued with application of EC/WS and JP/BM, standing tolerance use of BUE using dry dust mop overhead, above head level, chest level, waist level, below waist level, below knee level.  Patient concluded w/ UB ROM/endurance:  Max resistance Theraputty w/ written guide as visual aid.  OT concluding with reassessment of AROM, MMT, grip strength using dynamometer bilateral.  A:   Patient  reported completing active stretch, tendon glides and ASL independently at home and OT has previously reviewed patient return demo independence with above-listed home programs.  Patient  continued with application of EC/WS and JP/BM, standing tolerance use of BUE using dry dust mop overhead, above head level, chest level, waist level, below waist level, below knee level.  Patient return demo understanding and application of how to stand, positioned  hands on handle without grasping to type for over exertion, relaxing shoulders, walking and using gravity to assist while keeping cleaning object inpatients midline and moving BUE up/down cleaning Tool promoting EC/WS and JP/BM based on position while using slide side to side/lower 1 row and repeat steps technique.  Patient requiring extra time but demonstrating modified independence reporting, "this just make so much since along with everything else that I have learned.  I do not think I have any problems following up doing this at home or if it relates to what I do at work. "  OT noting patient standing total time 30 MIN demo modified independent with all EC/WS, JP/BM, activity tolerance, hand ergonomics incorporation while OT noting increased activity tolerance remaining engaged in education and follow-up return demo while standing 30 minutes. TOTAL TIME:  30 MIN. Patient concluded w/ UB ROM/endurance:  Max resistance Theraputty w/ written guide as visual aid.  Patient able to return demo 1 x 3 reps each hand using firm and extra firm Thera Putty/bilateral using written guide as visual aid:  Scissors spread, thumb press, thumb extension, thumb pinch strengthening, thumb abduction, 3 jaw chuck pinch, finger hook, full grip, finger pinch, finger extension all digits both hands, finger scissor and finger spread.  Patient REPORTING TO OT THAT SHE HAS ORDERED FROM [employer] extra firm resistance Thera Putty to continue home program with written guide as visual aid.  TOTAL TIME: 15 MIN. OT concluding with  reassessment of AROM, MMT, grip strength using dynamometer bilateral with following outcomes:  AROM left wrist for F/E, UD/RD, all digits within normal limits.  AROM BUE all joints within normal limits.  MMT left wrist F/E, UD/RD 4+/5, forearm pronation/supination: 4+/5.  Grip strength reassessed using dynamometer with following outcomes RUE 67 psi, left upper extremity 56.3 psi meeting all long-term goals from  POC/C POC.  TOTAL TREATMENT TIME: 45 MIN.    SHORT TERM GOALS TO BE MET WITHIN 4 WEEKS     1. PATIENT WILL RETURN DEMO: PACING, JP/BM, RETROGRADE/SCAR MASSAGE AS APPROPRIATE DURING CLINICAL ACTIVITIES 75% OR MORE OF THE TIME WITH MIN VC/DC/TC AND/OR ASSISTANCE FROM OT PROMOTING INCREASED PARTICIPATION AND INDEPENDENCE W/ SELF CARE, SOCIAL AND  COMMUNITY REINTEGRATION.  Met 07/17/2023 X 1.  Met 07/23/2023 X 2.  Met 08/01/2023 X 3.  Discontinue and proceed toward LTG  2. PATIENT WILL RETURN DEMO CORRECT HAND ERGONOMICS 75% OR MORE OF THE TIME TO PROMOTE DECREASING JOINT PAIN/FATIGUE FOR MORE PRODUCTIVE USE OF BUE'S WITH DAILY ROUTINES.  Met 07/17/2023 X 1.  Met 07/23/2023 X 2.  Met 08/01/2023 X 3.  Discontinue and proceed toward LTG  3. THROUGH PARTICIPATION IN GRADED UB/ROM/ENDURANCE AND GRADED THERAPEUTIC ACTIVITY: PATIENT WILL INCREASE :  GMC/FMC SKILLS DEMONSTRATING 75% ACCURACY 7/10 TRIALS,  FUNCTIONAL GRIP STRENGTH X 5 PSI, AND IN-HAND MANIPULATION INCREASING OCCUPATIONAL PERFORMANCE WITH ALL DAILY LIFE ROUTINES.  Met 07/17/2023 X 1.  Discontinue and proceed toward LTG  LONG TERM GOALS TO BE MET WITHIN 8 WEEKS  1. PATIENT WILL RETURN DEMO: PACING, JP/BM, RETROGRADE/SCAR MASSAGE AS APPROPRIATE DURING CLINICAL ACTIVITIES 90% OR MORE OF THE TIME MOD I PROMOTING INCREASED PARTICIPATION AND INDEPENDENCE W/ SELF CARE, SOCIAL AND  COMMUNITY REINTEGRATION.  Met 08/06/2023 X 1.  Met 08/14/2023 X 2.  Met 08/22/2023 X 3.  Discontinue  2. PATIENT WILL RETURN DEMO CORRECT HAND ERGONOMICS 90% OR MORE OF THE TIME MOD I FOR LIFT/CARRY/PUSH/PULL FOR MORE PRODUCTIVE USE OF BUE'S WITH DAILY ROUTINES W/ MAX PAIN REPORT 2/10.  Met 08/14/2023 X 1.  Met 08/19/2023 X 2.  Met 08/22/2023 X 3.  Discontinue  3. THROUGH PARTICIPATION IN GRADED UB/ROM/ENDURANCE AND GRADED THERAPEUTIC ACTIVITY: PATIENT WILL INCREASE :  GMC/FMC SKILLS DEMONSTRATING 90% FOR MORE ACCURACY X 10 TRIALS,  FUNCTIONAL GRIP STRENGTH X 15 PSI, AND IN-HAND MANIPULATION  INCREASING OCCUPATIONAL PERFORMANCE WITH ALL DAILY LIFE ROUTINES, JOB PERFORMANCE DUTIES.  Met 08/22/2023 for MMT, AROM, grip strength from POC/C POC.  Patient has been demonstrating meeting goal for GMC/FMC skills 12/4, 12/9 and 08/22/2023 (X 3) meeting LTG from POC/C POC.  P:  Discharge from outpatient occupational therapy services this day as patient has met all long-term goals from POC/C POC.  Patient thanking OT for all education, encouragement, adaptive techniques, home programs incorporated into whole-body experience for patient.  OT presenting patient with certificate of completion prior to patient leaving treatment room.    Total Session Time 45 and Timed code minutes 45  THERAPEUTIC ACTIVITY 45 minutes    Hillery Jacks, OTR/L 08/22/2023, 4:55 p.m.

## 2023-09-09 ENCOUNTER — Other Ambulatory Visit (HOSPITAL_COMMUNITY): Payer: Self-pay | Admitting: NURSE PRACTITIONER

## 2023-09-09 DIAGNOSIS — G4733 Obstructive sleep apnea (adult) (pediatric): Secondary | ICD-10-CM

## 2023-09-10 ENCOUNTER — Inpatient Hospital Stay (INDEPENDENT_AMBULATORY_CARE_PROVIDER_SITE_OTHER)
Admission: RE | Admit: 2023-09-10 | Discharge: 2023-09-10 | Disposition: A | Discharge status: Home / Self Care | Payer: Medicare Other | Source: Ambulatory Visit

## 2023-09-10 ENCOUNTER — Other Ambulatory Visit (INDEPENDENT_AMBULATORY_CARE_PROVIDER_SITE_OTHER): Payer: Self-pay

## 2023-09-10 DIAGNOSIS — R109 Unspecified abdominal pain: Secondary | ICD-10-CM

## 2023-09-10 DIAGNOSIS — K828 Other specified diseases of gallbladder: Secondary | ICD-10-CM

## 2023-09-12 ENCOUNTER — Other Ambulatory Visit: Payer: Self-pay

## 2023-09-12 ENCOUNTER — Ambulatory Visit
Admission: RE | Admit: 2023-09-12 | Discharge: 2023-09-12 | Disposition: A | Discharge status: Home / Self Care | Payer: Medicare Other | Source: Ambulatory Visit | Attending: NURSE PRACTITIONER | Admitting: NURSE PRACTITIONER

## 2023-09-12 DIAGNOSIS — Z882 Allergy status to sulfonamides status: Secondary | ICD-10-CM

## 2023-09-12 DIAGNOSIS — G4733 Obstructive sleep apnea (adult) (pediatric): Secondary | ICD-10-CM | POA: Insufficient documentation

## 2023-09-13 NOTE — Procedures (Signed)
[facility]    Sleep Study    Patient Name: Dawn Riley  Date of Birth: 18-Mar-1956  Gender: female  Provider: Ralph Dowdy, DO  Location: [facility]  Location Address: [address] [place], [address]  [facility]  Location Phone: [phone] [facility]      Primary Care Provider: Romie Levee, CRNP  Referring Provider: Larence Penning, NP    Sleep Study    Chief Complaint    Past Medical History  No past medical history on file.      Past Surgical History  Past Surgical History:   Procedure Laterality Date    COLONOSCOPY      HX HERNIA REPAIR      HX KNEE REPLACMENT             Medication LIst  No outpatient medications have been marked as taking for the 09/12/23 encounter [facility] Encounter) with SLEEP LAB BED 1 PRN.       Allergy List  Allergies   Allergen Reactions    Sulfa (Sulfonamides)      Other Reaction(s): other    Sulfamethoxazole  Other Adverse Reaction (Add comment)     Patient had numbness in her extremities    Azelastine Mental Status Effect     Felt anxious and could not sleep.       Family Medical History  Family Medical History:    None           Social History  Social History     Socioeconomic History    Marital status: Married   Tobacco Use    Smoking status: Never    Smokeless tobacco: Never   Vaping Use    Vaping status: Never Used         Patient Information  Sleep apnea symptoms: Excessive Daytime Sleepiness, Snoring, and Witnessed Apneas  Patient's Height: 64.0  Patient's Weight: 239.0  Patient's BMI: 41.0  Neck Circumference: 14  Epworth Scale Score: 19    Date of Study: 09/12/2023  Study Performed By: Jamelle Rushing, LPN   Study Reviewed By: Ula Lingo, RRT, RPSGT, CCSH    Procedure : Procedure: Complete PSG with digital sleep system using the international 10-20 electrode placement for recording EEG, EOG, EMG from chin, ECG, Respiratory effort, oximetry, body position, airflow, snoring sound, pulse rate, and limb movement channels.  Sleep Architecture: Total  Recording Time: 344.9 min Total Sleep Time: 226.9 min. Sleep Efficiency Percentage: 54.2%. Patient Sleep Latency (Minutes): 73.8 min. Patient REM Latency (Minutes): 71.5 min. Percentage of Stage I Sleep:  3.3 %. Percentage of Stage II Sleep: 57.0 %. Percentage of Stage III Sleep: 22.0 %. Percentage (%) of REM sleep: 17.6 %.   Respiratory Data: Number of Obstructive Apneas Observed: 1 . Number of Central Apneas Observed: 0.0. Number of Mixed Apneas Observed: 0.0. Total Number of Apneas: 1. Total number of Hypopneas: 20. Snoring During the Study: Yes, moderate  Apnea Hypopnea Index (AHI) per hour: 5.6. Percent of Time Patient Spent in Supine Position: 4.4%. AHI in Supine Position #: 0.0. Non-supine AHI #: 5.6. REM AHI #: 28.5.Non-REM AHI #:  0.6. Lowest Desaturation Percentage: 86%. Total Amount of Desaturated Time Below or Equal to 7.8 .  Cardiac Data: The Mean Heart Rate During the Study (Beats/Min): 74.4. Sinus Rhythm: Normal   Periodic Leg Movements: Total Number of Periodic Leg Movements During the Study: 0.0. PLM Index #: 0.0    The study was scored by: 30 second EPOCH,  4 % desaturation and  CMS guidelines.AASM Accredited     Assessment:   Diagnosis:   (R09.02) Hypoxemia ([id number]), (R06.83) Snoring (SCT), and (G47.33) OSA - Obstructive sleep apnea (SCT)    PLAN  Procedure Orders  Titration study    Instructions:   Patient advised to avoid sedatives, hypnotics, sedating antihistamines and alcohol until CPAP titration is completed and therapy initiated., Reviewed raw sleep data and agree with therapeutic directions outlined in report., The natural history of obstructive sleep apnea and its tendency to be associated with cardiac, pulmonary, neuropsychiatric discussed in detail with patient., The etiology of OSA was discussed in detail with patient, Driving while drowsy was discussed in detail, Reviews of trustworthy websites recommended, All benefits of CPAP/BiPAP discussed in detail, Advised to call if any  difficulties or questions regarding CPAP/BiPAP device, Positional Therapy may be helpful in snoring, and Nasal steroids may be helpful for primary snoring    Impression  Ahi 5.6 per hour and Mild OSA  All stages of sleep were achieved.     Dortha Schwalbe Ula Lingo, RRT, RPSGT, [facility]      Pulmonary Attending:  I have personally reviewed the raw data for the sleep study and  I agree with the assessment and plan as above.    Scoring seem appropriate.   Mild OSA noted with AHI of 5.6.   Will proceed with titration study  Will need to follow up in the office to review face to face within 90 days.     Duard Brady, DO  09/17/2023 11:12

## 2023-09-17 ENCOUNTER — Ambulatory Visit (INDEPENDENT_AMBULATORY_CARE_PROVIDER_SITE_OTHER): Payer: Self-pay | Admitting: OTOLARYNGOLOGY

## 2023-09-19 ENCOUNTER — Other Ambulatory Visit (INDEPENDENT_AMBULATORY_CARE_PROVIDER_SITE_OTHER): Payer: Self-pay

## 2023-09-23 ENCOUNTER — Other Ambulatory Visit: Payer: Self-pay

## 2023-09-23 ENCOUNTER — Encounter (HOSPITAL_COMMUNITY): Payer: Self-pay | Admitting: SLEEP MEDICINE

## 2023-09-23 ENCOUNTER — Ambulatory Visit: Payer: Medicare Other | Attending: SLEEP MEDICINE | Admitting: SLEEP MEDICINE

## 2023-09-23 VITALS — BP 153/67 | HR 76 | Resp 18 | Ht 64.0 in | Wt 242.0 lb

## 2023-09-23 DIAGNOSIS — G4733 Obstructive sleep apnea (adult) (pediatric): Secondary | ICD-10-CM | POA: Insufficient documentation

## 2023-09-23 DIAGNOSIS — F419 Anxiety disorder, unspecified: Secondary | ICD-10-CM

## 2023-09-23 DIAGNOSIS — J309 Allergic rhinitis, unspecified: Secondary | ICD-10-CM

## 2023-09-23 NOTE — H&P (Unsigned)
SLEEP DISORDERS, [facility]  [address]  [place] [address]  Operated by [facility]     New Patient Sleep Note    Patient Name: Dawn Riley  Date: 09/23/2023  Department:  SLEEP DISORDERS, [facility]  MRN: [id number]  DOB: Jan 11, 1956  Primary Care Provider:  Romie Levee, CRNP  Referring Provider:  Romie Levee, CRNP    Chief Complaint:   Chief Complaint   Patient presents with    Establish Care     Pt presents to establish primary sleep care. She was referred due to jerking in her sleep, difficulty sleeping through the night, reported snoring, daytime sleepiness and fatigue. She had a sleep study on 09/12/23 that showed mild OSA.          HPI:     Dawn Riley is a 68 y.o., Black/African American female presents today to establish care for sleep disorder.     Epworth assessment done in office today with a total score of 13.      Polysomnography results reviewed from 09/12/23 and results showing desaturation of oxygen with lowest at 86% And staying at or below 88% for 7.8 Minutes.  Patient shown to have mild obstructive sleep apnea (OSA) with AHI at 5.6 when.  Patient will require CPAP titration study.  Patient advised on possible treatments of obstructive sleep apnea and first option of treatment being CPAP.  CPAP study will be done and best pressures set up and ordered.  Will start CPAP after CPAP Titration sleep study.  Polysomnography study ordered and will be scheduled.        Past Medical history  Past Medical History:   Diagnosis Date    Leaky heart valve     OSA (obstructive sleep apnea)      Past Surgical History  Past Surgical History:   Procedure Laterality Date    COLONOSCOPY      HX CARPAL TUNNEL RELEASE Left     HX HERNIA REPAIR      HX KNEE REPLACMENT      Bilateral     Medication List  Current Outpatient Medications   Medication Sig    ammonium lactate (GERI-HYDROLAC) 12 % Cream Apply topically Twice daily    cetirizine (ZYRTEC) 10 mg  Oral Tablet Take 1 Tablet (10 mg total) by mouth Every evening    cholecalciferol, vitamin D3, 1,250 mcg (50,000 unit) Oral Capsule Take 1 Capsule (50,000 Units total) by mouth Every 7 days    fluticasone propionate (FLONASE) 50 mcg/actuation Nasal Spray, Suspension Administer 2 Sprays into each nostril Once a day    loratadine (CLARITIN) 5 mg/5 mL Oral Solution Take 10 mL (10 mg total) by mouth    LORazepam (ATIVAN) 0.5 mg Oral Tablet lorazepam 0.5 mg tablet   TAKE 1 TABLET BY MOUTH ONCE DAILY    urea 20 % Cream Apply topically Once a day     Allergy List  Allergy History as of 09/23/23       AZELASTINE         Noted Status Severity Type Reaction    02/19/22 0935 Elvia Collum, LPN 47/82/95 Active Low  Mental Status Effect    Comments: Felt anxious and could not sleep.               SULFAMETHOXAZOLE         Noted Status Severity Type Reaction    02/19/22 0935 Elvia Collum, LPN 62/13/08 Active  Other Adverse Reaction (Add comment)    Comments: Patient had numbness in her extremities               SULFA (SULFONAMIDES)         Noted Status Severity Type Reaction    08/21/23 0810 Gildardo Griffes, MA 08/21/23 Active       Comments: Other Reaction(s): other                   Family History   Family Medical History:    None         Social History  Social History     Socioeconomic History    Marital status: Married     Spouse name: Not on file    Number of children: Not on file    Years of education: Not on file    Highest education level: Not on file   Occupational History    Not on file   Tobacco Use    Smoking status: Never     Passive exposure: Never    Smokeless tobacco: Never   Vaping Use    Vaping status: Never Used   Substance and Sexual Activity    Alcohol use: Never    Drug use: Not on file    Sexual activity: Not on file   Other Topics Concern    Not on file   Social History Narrative    Not on file     Social Determinants of Health     Financial Resource Strain: Not on file   Transportation Needs: Not  on file   Social Connections: Not on file   Intimate Partner Violence: Not on file   Housing Stability: Not on file       Review of system  General:  Denies fever, chills, night sweats, loss of appetite.  Neurological:  Denies dizziness or seizures.  Gastrointestinal:  Denies uncontrolled reflux, heartburn, or diarrhea.  Cardiovascular:  Denies chest pain or irregular heartbeats.  Respiratory: see HPI  Musculoskeletal:  Denies uncontrolled joint pain or restless legs.  Endocrine/Metabolic:  Denies weight gain or weight loss.  Mental Status/Psychiatric:  Denies uncontrolled anxiety or depression.  Integumentary:  Denies rash or new abnormal skin lesions.    Vital Signs  Vitals:    09/23/23 1712   BP: (!) 153/67   Pulse: 76   Resp: 18   SpO2: 98%   Weight: 110 kg (242 lb)   Height: 1.626 m (5\' 4" )   BMI: 41.54     Neck (Inches): 13    PHYSICAL EXAMINATION:   Constitutional:  Vital signs stable.  General appearance of the patient:  Alert, no acute distress.  Normal appearance, well nourished.  Eyes:  Normal eye lids.  Conjunctiva normal.  Ears, Nose, Mouth, and Throat: External inspection of ears and nose with normal appearance.  Nares patent.  Neck: Supple with trachea midline, non tender, no nodules, no masses, gland position midline.  Respiratory:  Auscultation of lungs with normal breath sounds, no rales, no rhonchi, no wheezing.  Respiratory effort with no tractions, breathing regular and unlabored.  Cardiovascular:  Regular rhythm and regular rate.  No murmur, no peripheral edema.  Gastrointestinal: Abdomen non-tender.  Musculoskeletal:  Normal gait and station, normal digits, no digital cyanosis or clubbing.  Mental Status/Psychiatric:  Alert- oriented to person, place, and time.  Appropriate and normal mood.    Assessment & Plan  OSA (obstructive sleep apnea)    Orders:    POLYSOMNOGRAPHY -  WITH CPAP (MUST BE PRECEDED BY OVERNIGHT (ANP) STUDY; Future         Instructions  Continue medications as  prescribed/directed unless changed by provider.  Plan of care discussed with patient.    No follow-ups on file.    The patient was given the opportunity to ask questions and those questions were answered to the patient's satisfaction. The patient was encouraged to call with any additional questions or concerns. Discussed with patient effects and side effects of medications. Medication safety was discussed.  The patient was informed to contact the office within 7 business days if a message/lab results/referral/imaging results have not been conveyed to the patient.    Electronically signed by Larence Penning, FNP-BC   Pulmonary and Critical care    This note may have been partially generated using MModal Fluency Direct system, and there may be some incorrect words, spellings, and punctuation that were not noted in checking the note before saving.

## 2023-09-23 NOTE — H&P (Unsigned)
SLEEP DISORDERS, [facility]  [address]  [place] [address]  Operated by [facility]     New Patient Sleep Note    Patient Name: Dawn Riley  Date: 09/23/2023  Department:  SLEEP DISORDERS, [facility]  MRN: [id number]  DOB: Jan 22, 1956  Primary Care Provider:  Romie Levee, CRNP  Referring Provider:  Romie Levee, CRNP    Chief Complaint:   Chief Complaint   Patient presents with    Establish Care     Pt presents to establish primary sleep care. She was referred due to jerking in her sleep, difficulty sleeping through the night, reported snoring, daytime sleepiness and fatigue. She had a sleep study on 09/12/23 that showed mild OSA.          HPI:     Dawn Riley is a 68 y.o., Black/African American female presents today to establish care for sleep disorder.  Patient primary care ordered sleep study and was done.  Patient has had symptoms including excessive daytime sleepiness, snoring, fatigue, and witnessed apneas.     Epworth assessment done in office today with a total score of 13.    Polysomnography results reviewed from 09/12/23 and results showing desaturation of oxygen with lowest at 86% And staying at or below 88% for 7.8 Minutes.  Patient shown to have mild/moderate/severe obstructive sleep apnea (OSA) with AHI at 5.6.      Patient declines any vaccination.    Never smoker.    Past Medical history  Past Medical History:   Diagnosis Date    Leaky heart valve     OSA (obstructive sleep apnea)      Past Surgical History  Past Surgical History:   Procedure Laterality Date    COLONOSCOPY      HX CARPAL TUNNEL RELEASE Left     HX HERNIA REPAIR      HX KNEE REPLACMENT      Bilateral     Medication List  Current Outpatient Medications   Medication Sig    ammonium lactate (GERI-HYDROLAC) 12 % Cream Apply topically Twice daily    cetirizine (ZYRTEC) 10 mg Oral Tablet Take 1 Tablet (10 mg total) by mouth Every evening    cholecalciferol, vitamin D3, 1,250  mcg (50,000 unit) Oral Capsule Take 1 Capsule (50,000 Units total) by mouth Every 7 days    fluticasone propionate (FLONASE) 50 mcg/actuation Nasal Spray, Suspension Administer 2 Sprays into each nostril Once a day    loratadine (CLARITIN) 5 mg/5 mL Oral Solution Take 10 mL (10 mg total) by mouth    LORazepam (ATIVAN) 0.5 mg Oral Tablet lorazepam 0.5 mg tablet   TAKE 1 TABLET BY MOUTH ONCE DAILY    urea 20 % Cream Apply topically Once a day     Allergy List  Allergy History as of 09/24/23       AZELASTINE         Noted Status Severity Type Reaction    02/19/22 0935 Elvia Collum, LPN 95/62/13 Active Low  Mental Status Effect    Comments: Felt anxious and could not sleep.               SULFAMETHOXAZOLE         Noted Status Severity Type Reaction    02/19/22 0935 Elvia Collum, LPN 08/65/78 Active    Other Adverse Reaction (Add comment)    Comments: Patient had numbness in her extremities  SULFA (SULFONAMIDES)         Noted Status Severity Type Reaction    08/21/23 0810 Gildardo Griffes, MA 08/21/23 Active       Comments: Other Reaction(s): other                   Family History   Family Medical History:    None         Social History  Social History     Socioeconomic History    Marital status: Married     Spouse name: Not on file    Number of children: Not on file    Years of education: Not on file    Highest education level: Not on file   Occupational History    Not on file   Tobacco Use    Smoking status: Never     Passive exposure: Never    Smokeless tobacco: Never   Vaping Use    Vaping status: Never Used   Substance and Sexual Activity    Alcohol use: Never    Drug use: Not on file    Sexual activity: Not on file   Other Topics Concern    Not on file   Social History Narrative    Not on file     Social Determinants of Health     Financial Resource Strain: Not on file   Transportation Needs: Not on file   Social Connections: Not on file   Intimate Partner Violence: Not on file   Housing  Stability: Not on file       Review of system  General:  Denies fever, chills, night sweats, loss of appetite.  Neurological:  Denies dizziness or seizures.  Gastrointestinal:  Denies uncontrolled reflux, heartburn, or diarrhea.  Cardiovascular:  Denies chest pain or irregular heartbeats.  Respiratory: see HPI  Musculoskeletal:  Denies uncontrolled joint pain or restless legs.  Endocrine/Metabolic:  Denies weight gain or weight loss.  Mental Status/Psychiatric:  Denies uncontrolled anxiety or depression.  Integumentary:  Denies rash or new abnormal skin lesions.    Vital Signs  Vitals:    09/23/23 1712   BP: (!) 153/67   Pulse: 76   Resp: 18   SpO2: 98%   Weight: 110 kg (242 lb)   Height: 1.626 m (5\' 4" )   BMI: 41.54     Neck (Inches): 13    PHYSICAL EXAMINATION:   Constitutional:  Vital signs stable.  General appearance of the patient:  Alert, no acute distress.  Normal appearance, well nourished.  Eyes:  Normal eye lids.  Conjunctiva normal.  Ears, Nose, Mouth, and Throat: External inspection of ears and nose with normal appearance.  Nares patent.  Neck: Supple with trachea midline, non tender, no nodules, no masses, gland position midline.  Respiratory:  Auscultation of lungs with normal breath sounds, no rales, no rhonchi, no wheezing.  Respiratory effort with no tractions, breathing regular and unlabored.  Cardiovascular:  Regular rhythm and regular rate.  No murmur, no peripheral edema.  Gastrointestinal: Abdomen non-tender.  Musculoskeletal:  Normal gait and station, normal digits, no digital cyanosis or clubbing.  Mental Status/Psychiatric:  Alert- oriented to person, place, and time.  Appropriate and normal mood.    Assessment & Plan  OSA (obstructive sleep apnea)    Orders:    POLYSOMNOGRAPHY - WITH CPAP (MUST BE PRECEDED BY OVERNIGHT (ANP) STUDY; Future    Anxiety         Allergic rhinitis  Instructions  Patient will require CPAP titration study.  Patient advised on possible treatments of  obstructive sleep apnea and first option of treatment being CPAP.  CPAP study will be done and best pressures set up and ordered.  Will start CPAP after CPAP Titration sleep study.  Polysomnography study ordered and will be scheduled.      Continue medications as prescribed/directed unless changed by provider.    Plan of care discussed with patient.    Return in about 3 months (around 12/22/2023). Patient was advised to come back earlier if any symptoms get worse.  Also advised to come back for results of any test done.  If unable to keep regular appointment, patient was advised to schedule another appointment as soon as possible.     The patient was given the opportunity to ask questions and those questions were answered to the patient's satisfaction. The patient was encouraged to call with any additional questions or concerns. Discussed with patient effects and side effects of medications. Medication safety was discussed.  The patient was informed to contact the office within 7 business days if a message/lab results/referral/imaging results have not been conveyed to the patient.    Electronically signed by Larence Penning, FNP-BC   Pulmonary and Critical care    This note may have been partially generated using MModal Fluency Direct system, and there may be some incorrect words, spellings, and punctuation that were not noted in checking the note before saving.

## 2023-09-24 ENCOUNTER — Ambulatory Visit (HOSPITAL_COMMUNITY): Payer: Self-pay | Admitting: SLEEP MEDICINE

## 2023-09-24 DIAGNOSIS — J309 Allergic rhinitis, unspecified: Secondary | ICD-10-CM | POA: Insufficient documentation

## 2023-09-27 DIAGNOSIS — M65312 Trigger thumb, left thumb: Secondary | ICD-10-CM | POA: Insufficient documentation

## 2023-09-30 ENCOUNTER — Other Ambulatory Visit (INDEPENDENT_AMBULATORY_CARE_PROVIDER_SITE_OTHER): Payer: Self-pay

## 2023-10-02 ENCOUNTER — Encounter (INDEPENDENT_AMBULATORY_CARE_PROVIDER_SITE_OTHER): Payer: Self-pay | Admitting: Surgery

## 2023-10-02 ENCOUNTER — Other Ambulatory Visit: Payer: Self-pay

## 2023-10-02 ENCOUNTER — Ambulatory Visit (INDEPENDENT_AMBULATORY_CARE_PROVIDER_SITE_OTHER): Payer: Medicare Other | Admitting: Surgery

## 2023-10-02 VITALS — BP 141/70 | HR 61 | Ht 64.0 in | Wt 242.0 lb

## 2023-10-02 DIAGNOSIS — K828 Other specified diseases of gallbladder: Secondary | ICD-10-CM

## 2023-10-02 DIAGNOSIS — K432 Incisional hernia without obstruction or gangrene: Secondary | ICD-10-CM

## 2023-10-02 DIAGNOSIS — R7309 Other abnormal glucose: Secondary | ICD-10-CM

## 2023-10-02 NOTE — Addendum Note (Signed)
Addended by: Burnett Kanaris on: 10/02/2023 01:04 PM     Modules accepted: Orders

## 2023-10-02 NOTE — Progress Notes (Signed)
GENERAL SURGERY, [facility]  [address]  [place] [address]  Phone: [phone]  Fax: [phone]    Encounter Date: 10/02/2023    Patient ID:  Dawn Riley  BJS:[id number]    DOB: 26-Jul-1956  Age: 68 y.o. female    Subjective:     Chief Complaint   Patient presents with    Follow Up     Dysfunctional gallbladder   Recurrent ventral hernia   Right Flank pain   Abdominal US 09/10/2023      HPI:   This is a follow-up visit.  Patient claimed that she has not had any right upper quadrant pain since the last visit.  She generally avoids fried and greasy food but she did have fried pork chops the other day that did not cause any problem.  She has no nausea or vomiting bloating or other abdominal pain in the epigastric area however she did have some gas problem after eating beans.    She does get discomfort in the periumbilical area but no symptoms suggestive of bowel obstruction.  There is a history of ventral hernia repair without mesh in 2019 and there is report of CT scan done at [facility] in 2020 showing recurrence of ventral hernia.    Patient had sonogram of the gallbladder in December of 2023 that did not show any gallstones but on HIDA scan her ejection fraction was 15% this may with the time when she was using Ozempic which he did only for 6 weeks.  I repeated her gallbladder sonogram which was done last month that is also normal showing no stones or other abnormality.  Her ejection fraction in November of 12/11/2018 months ago did show an ejection fraction of 16%    2 months ago she had a CBC which was normal CMP was also unremarkable but and TSH was normal but she have hemoglobin A1c of 6.9 and elevated lipids    Current Outpatient Medications   Medication Sig    ammonium lactate (GERI-HYDROLAC) 12 % Cream Apply topically Twice daily    cetirizine (ZYRTEC) 10 mg Oral Tablet Take 1 Tablet (10 mg total) by mouth Every evening    cholecalciferol, vitamin D3, 1,250 mcg (50,000 unit) Oral  Capsule Take 1 Capsule (50,000 Units total) by mouth Every 7 days    fluticasone propionate (FLONASE) 50 mcg/actuation Nasal Spray, Suspension Administer 2 Sprays into each nostril Once a day    loratadine (CLARITIN) 5 mg/5 mL Oral Solution Take 10 mL (10 mg total) by mouth    LORazepam (ATIVAN) 0.5 mg Oral Tablet lorazepam 0.5 mg tablet   TAKE 1 TABLET BY MOUTH ONCE DAILY    magnesium oxide (MAG-OX) 400 mg Oral Tablet Take 1 tablet every day by oral route with meal(s).    meloxicam (MOBIC) 7.5 mg Oral Tablet Take 1 Tablet (7.5 mg total) by mouth Once a day    urea 20 % Cream Apply topically Once a day     Allergies   Allergen Reactions    Sulfa (Sulfonamides)      Other Reaction(s): other    Sulfamethoxazole  Other Adverse Reaction (Add comment)     Patient had numbness in her extremities    Azelastine Mental Status Effect     Felt anxious and could not sleep.     Past Medical History:   Diagnosis Date    Leaky heart valve     OSA (obstructive sleep apnea)  Past Surgical History:   Procedure Laterality Date    COLONOSCOPY      HX CARPAL TUNNEL RELEASE Left     HX HERNIA REPAIR      HX KNEE REPLACMENT      Bilateral         Family Medical History:    None         Social History     Tobacco Use    Smoking status: Never     Passive exposure: Never    Smokeless tobacco: Never   Vaping Use    Vaping status: Never Used   Substance Use Topics    Alcohol use: Never       ROS:   She does not have any transient ischemic attacks.  No active chest pains or palpitations.  No cough hemoptysis or dyspnea.  No fever chills or night sweats.  No urinary complaints no claudication.  Objective:   Vitals: BP (!) 141/70 (Site: Left Arm, Patient Position: Sitting, Cuff Size: Adult)   Pulse 61   Ht 1.626 m (5\' 4" )   Wt 110 kg (242 lb)   SpO2 97%   BMI 41.54 kg/m         Physical Exam:  Patient is awake and alert and fully oriented and in no distress.  Vital signs are stable.  She is significantly overweight.  Neck supple    Lungs are clear   Heart sounds normal  Abdomen is soft but protuberant there was no epigastric or upper quadrant tenderness.  Surgical scar is noted in the lower midline umbilical area with a from scarring and some tenderness no obvious fascial defect is palpable that could be because of the patient's body habitus.      Previous records laboratory and imaging studies reviewed as mentioned above.    Assessment & Plan:     ENCOUNTER DIAGNOSES     ICD-10-CM   1. Dysfunctional gallbladder  K82.8   2. Recurrent ventral hernia  K43.2   Patient does not realize that she may be diabetic and I told her that her hemoglobin A1c was 6.9 which does indicate diabetes and her lipids were also elevated so she should discuss this with the PCP soon as possible    Regarding dysfunctional gallbladder she seemed to be relatively asymptomatic is not having any pain or symptoms that could be attributed to the gallbladder so far that she can continue watching her diet as mentioned and may not need surgical intervention at this point.      Regarding ventral hernia she is having some discomfort in the umbilical area although no signs of obstruction I would get a CT scan of the abdomen and pelvis.     Patient claimed that her hernia repair was done without mesh.      She will be called after the CT report otherwise return for follow-up regarding the gallbladder in 4 months.    No orders of the defined types were placed in this encounter.      No follow-ups on file.    Olevia Perches, MD

## 2023-10-04 ENCOUNTER — Ambulatory Visit (HOSPITAL_COMMUNITY): Payer: Medicare Other

## 2023-10-04 ENCOUNTER — Other Ambulatory Visit: Payer: Self-pay

## 2023-10-04 ENCOUNTER — Other Ambulatory Visit (INDEPENDENT_AMBULATORY_CARE_PROVIDER_SITE_OTHER): Payer: Self-pay | Admitting: Surgery

## 2023-10-04 ENCOUNTER — Ambulatory Visit
Admission: RE | Admit: 2023-10-04 | Discharge: 2023-10-04 | Disposition: A | Discharge status: Home / Self Care | Payer: Medicare Other | Source: Ambulatory Visit | Attending: Surgery

## 2023-10-04 DIAGNOSIS — K828 Other specified diseases of gallbladder: Secondary | ICD-10-CM

## 2023-10-04 DIAGNOSIS — K432 Incisional hernia without obstruction or gangrene: Secondary | ICD-10-CM

## 2023-10-04 LAB — CREATININE WITH EGFR
CREATININE: 0.73 mg/dL (ref 0.60–1.30)
ESTIMATED GFR: 90 mL/min/{1.73_m2} (ref >59)

## 2023-10-04 MED ORDER — IOHEXOL 350 MG IODINE/ML INTRAVENOUS SOLUTION
75.0000 mL | INTRAVENOUS | Status: AC
Start: 2023-10-04 — End: 2023-10-04
  Administered 2023-10-04: 75 mL via INTRAVENOUS

## 2023-10-04 MED ORDER — BARIUM SULFATE 2 % (W/V) ORAL SUSPENSION
450.0000 mL | Freq: Once | ORAL | Status: AC
Start: 2023-10-04 — End: 2023-10-04
  Administered 2023-10-04: 450 mL via ORAL

## 2023-11-23 DIAGNOSIS — M25542 Pain in joints of left hand: Secondary | ICD-10-CM | POA: Insufficient documentation

## 2023-12-16 ENCOUNTER — Encounter (INDEPENDENT_AMBULATORY_CARE_PROVIDER_SITE_OTHER): Payer: Self-pay | Admitting: OTOLARYNGOLOGY

## 2023-12-16 ENCOUNTER — Other Ambulatory Visit: Payer: Self-pay

## 2023-12-16 ENCOUNTER — Ambulatory Visit: Payer: Self-pay | Attending: OTOLARYNGOLOGY | Admitting: OTOLARYNGOLOGY

## 2023-12-16 VITALS — Ht 64.0 in | Wt 242.0 lb

## 2023-12-16 DIAGNOSIS — J309 Allergic rhinitis, unspecified: Secondary | ICD-10-CM | POA: Insufficient documentation

## 2023-12-16 DIAGNOSIS — H6120 Impacted cerumen, unspecified ear: Secondary | ICD-10-CM | POA: Insufficient documentation

## 2023-12-16 DIAGNOSIS — R42 Dizziness and giddiness: Secondary | ICD-10-CM | POA: Insufficient documentation

## 2023-12-16 DIAGNOSIS — H6993 Unspecified Eustachian tube disorder, bilateral: Secondary | ICD-10-CM | POA: Insufficient documentation

## 2023-12-16 DIAGNOSIS — H6123 Impacted cerumen, bilateral: Secondary | ICD-10-CM

## 2023-12-16 DIAGNOSIS — H9313 Tinnitus, bilateral: Secondary | ICD-10-CM | POA: Insufficient documentation

## 2023-12-16 MED ORDER — TRIAMCINOLONE ACETONIDE 55 MCG NASAL SPRAY AEROSOL
2.0000 | INHALATION_SPRAY | Freq: Every day | NASAL | 3 refills | Status: AC
Start: 2023-12-16 — End: ?

## 2023-12-16 MED ORDER — FLUOCINOLONE ACETONIDE OIL 0.01 % EAR DROPS
OTIC | 3 refills | Status: AC
Start: 2023-12-16 — End: ?

## 2023-12-16 NOTE — Procedures (Signed)
 ENT, [facility]  [address]  [place] [address]  Operated by [facility]  Procedure Note    Name: KIARI HOSMER MRN:  [id number]   Date: 12/16/2023 DOB:  Jun 19, 1956 (67 y.o.)         [address] - NASAL ENDOSCOPY DIAGNOSTIC UNILATERAL OR BILATERAL (AMB ONLY)    Performed by: Conchita Paris, DO  Authorized by: Conchita Paris, DO    Time Out:     Immediately before the procedure, a time out was called:  Yes    Patient verified:  Yes    Procedure Verified:  Yes    Site Verified:  Yes  Documentation:      ENT, [facility]  [address]  [place] [address]  Operated by [facility]  Procedure Note    Name: DANIEL JOHNDROW MRN:  [id number]  Date: 12/16/2023 DOB:  [birth year] (67 y.o.)        @PROCDOC @    Indications for procedure: Monitor chronic disease    Anesthesia: Oxymetazoline nasal spray    Description: Nasal endoscopy with rigid scope was performed with examination of the  septum, inferior, middle, and superior meatus, turbinates, sphenoethmoidal recess, and nasopharynx.     There were no polyps, pus, or granulation tissue noted.  ET orifices and nasopharynx were normal.     Findings: Allergic rhinitis    The patient tolerated the procedure well.    [address] [place], [place]         [address] - REMOVAL IMPACTED CERUMEN W/ INSTRUMENT, UNILATERAL (AMB ONLY-PD)    Performed by: Conchita Paris, DO  Authorized by: Conchita Paris, DO    Time Out:     Immediately before the procedure, a time out was called:  Yes    Patient verified:  Yes    Procedure Verified:  Yes    Site Verified:  Yes  Documentation:      Procedure: Cerumen cleaning  Pre-op Dx: Cerumen impaction      Bilateral EAC(s) examined under binocular microscopy.  Cerumen and/or debris was cleaned from the canal(s) using curettes, suction, and alligator forceps.  Patient tolerated procedure well.        Conchita Paris, DO

## 2023-12-16 NOTE — H&P (Signed)
 ENT, [facility]  [address]  [place] [address]  Operated by [facility]  Return Patient Visit    Name: Dawn Riley MRN:  [id number]   Date: 12/16/2023 DOB: 02/23/1956 (67 y.o.)       Referring Provider:  Romie Levee, CRNP    Reason for Visit:   Chief Complaint   Patient presents with    Follow Up 6 Months     6 month rc on allergies and ears.         History of Present Illness:  Dawn Riley is a 68 y.o. female who is FU on AR and dizziness. Dizziness was orthostatic hypotension, and sx's are improved. C/o alternating nasal congestion. Using Flonase but it causes burning.   Denies syncope, vertigo.       Patient History:  Patient Active Problem List   Diagnosis    Anxiety    Arthritis of carpometacarpal (CMC) joint of right thumb    Atrial fibrillation    Attention deficit hyperactivity disorder (ADHD)    Left carpal tunnel syndrome    Chest pain    Chronic pain of both shoulders    DDD (degenerative disc disease), lumbar    Disorder of bone density and structure, unspecified    HLD (hyperlipidemia)    Hyperglycemia    Impingement syndrome of shoulder region    Morton metatarsalgia    Neck pain    Nontraumatic complete tear of left rotator cuff    Obesity    Sleep apnea    Ovarian cyst    Osteoarthritis of spine with radiculopathy, cervical region    Palpitations    Postmenopause    Primary osteoarthritis of both knees    PVCs (premature ventricular contractions)    Rotator cuff tear, right    Status post total left knee replacement using cement    Tachycardia    Tear of left supraspinatus tendon    Mitral valve regurgitation    Pain in right shoulder    Low back pain    Osteoarthritis of knee    Vitamin D3 deficiency    Rotator cuff tear arthropathy    Right flank pain    Near syncope    Pain in right knee    Nonalcoholic fatty liver    Inflammation at injection site    Idiopathic peripheral neuropathy    Cholelithiasis without obstruction    Dysfunctional gallbladder     Recurrent ventral hernia    Allergic rhinitis    Trigger finger of left thumb     Current Outpatient Medications   Medication Sig    ammonium lactate (GERI-HYDROLAC) 12 % Cream Apply topically Twice daily    cetirizine (ZYRTEC) 10 mg Oral Tablet Take 1 Tablet (10 mg total) by mouth Every evening    cholecalciferol, vitamin D3, 1,250 mcg (50,000 unit) Oral Capsule Take 1 Capsule (50,000 Units total) by mouth Every 7 days    ciclopirox (PENLAC) 8 % Solution Apply topically Daily    Fluocinolone Acetonide Oil (DERMOTIC OIL) 0.01 % Otic Drops 4 gtts AU QHS PRN    fluticasone propionate (FLONASE) 50 mcg/actuation Nasal Spray, Suspension Administer 2 Sprays into each nostril Once a day    loratadine (CLARITIN) 5 mg/5 mL Oral Solution Take 10 mL (10 mg total) by mouth    LORazepam (ATIVAN) 0.5 mg Oral Tablet lorazepam 0.5 mg tablet   TAKE 1 TABLET BY MOUTH ONCE DAILY    magnesium oxide (MAG-OX) 400 mg  Oral Tablet Take 1 tablet every day by oral route with meal(s).    meloxicam (MOBIC) 7.5 mg Oral Tablet Take 1 Tablet (7.5 mg total) by mouth Daily    Triamcinolone Acetonide (NASACORT) 55 mcg Nasal Aerosol, Spray Administer 2 Sprays into affected nostril(s) Daily    urea 20 % Cream Apply topically Once a day      Allergies   Allergen Reactions    Sulfa (Sulfonamides)      Other Reaction(s): other    Sulfamethoxazole  Other Adverse Reaction (Add comment)     Patient had numbness in her extremities    Azelastine Mental Status Effect     Felt anxious and could not sleep.     Past Medical History:   Diagnosis Date    Leaky heart valve     OSA (obstructive sleep apnea)      Past Surgical History:   Procedure Laterality Date    COLONOSCOPY      HX CARPAL TUNNEL RELEASE Left     HX HERNIA REPAIR      HX KNEE REPLACMENT      Bilateral     Family Medical History:    None         Social History     Tobacco Use    Smoking status: Never     Passive exposure: Never    Smokeless tobacco: Never   Vaping Use    Vaping status: Never Used    Substance Use Topics    Alcohol use: Never       Review of Systems:  Review of Systems   Constitutional: Negative.        Physical Exam:  Ht 1.626 m (5\' 4" )   Wt 110 kg (242 lb)   BMI 41.54 kg/m       ENT Physical Exam  Constitutional  Appearance: patient appears well-developed, well-nourished and well-groomed,  Communication/Voice: communication appropriate for developmental age; vocal quality normal;  Head and Face  Appearance: head appears normal, face appears normal and face appears atraumatic;  Palpation: facial palpation normal;  Salivary: glands normal;  Ear  Hearing: intact;  Auricles: right auricle normal; left auricle normal;  External Mastoids: right external mastoid normal; left external mastoid normal;  Ear Canals: bilateral ear canals impacted cerumen observed;  Tympanic Membranes: right tympanic membrane normal; left tympanic membrane normal;  Nose  External Nose: nares patent bilaterally; external nose normal;  Internal Nose: bilateral intranasal mucosa edematous; nasal septal deviation present; bilateral inferior turbinates with hypertrophy;  Oral Cavity/Oropharynx  Lips: normal;  Teeth: normal;  Gums: gingiva normal;  Tongue: normal;  Oral mucosa: normal;  Hard palate: normal;  Neck  Neck: neck normal; neck palpation normal;  Thyroid: thyroid normal;  Respiratory  Inspection: breathing unlabored; normal breathing rate;  Lymphatic  Palpation: lymph nodes normal;  Neurovestibular  Mental Status: alert and oriented;  Psychiatric: mood normal; affect is appropriate;  Cranial Nerves: cranial nerves intact;       Assessment:  ENCOUNTER DIAGNOSES     ICD-10-CM   1. 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 Chronic allergic rhinitis  J30.9   3. Dizziness and giddiness  R42   4. ETD (Eustachian tube dysfunction), bilateral  H69.93   5. Impacted cerumen, unspecified laterality  H61.20       Plan:  Medical records reviewed on 12/16/2023.  Rx Dermotic and Nasacort. AU cerumen debrided.   Orders Placed This  Encounter    [phone] - NASAL ENDOSCOPY DIAGNOSTIC  UNILATERAL OR BILATERAL (AMB ONLY)    [id number] - REMOVAL IMPACTED CERUMEN W/ INSTRUMENT, UNILATERAL (AMB ONLY-PD)    Fluocinolone Acetonide Oil (DERMOTIC OIL) 0.01 % Otic Drops    Triamcinolone Acetonide (NASACORT) 55 mcg Nasal Aerosol, Spray     Return in about 6 months (around 06/16/2024).    Marcelline Deist, PA-C  The advanced practice clinician's documentation was reviewed/amended in its entirety with the assessment and plan portion completely performed independently by me during this separate encounter.

## 2024-01-13 ENCOUNTER — Ambulatory Visit (HOSPITAL_COMMUNITY): Payer: Self-pay | Admitting: SLEEP MEDICINE

## 2024-01-22 DIAGNOSIS — R252 Cramp and spasm: Secondary | ICD-10-CM | POA: Insufficient documentation

## 2024-01-23 ENCOUNTER — Other Ambulatory Visit (HOSPITAL_COMMUNITY): Payer: Self-pay | Admitting: NURSE PRACTITIONER

## 2024-01-23 DIAGNOSIS — R55 Syncope and collapse: Secondary | ICD-10-CM

## 2024-01-29 ENCOUNTER — Other Ambulatory Visit: Payer: Self-pay

## 2024-01-29 ENCOUNTER — Ambulatory Visit (INDEPENDENT_AMBULATORY_CARE_PROVIDER_SITE_OTHER): Payer: Self-pay | Admitting: Surgery

## 2024-01-29 ENCOUNTER — Encounter (INDEPENDENT_AMBULATORY_CARE_PROVIDER_SITE_OTHER): Payer: Self-pay | Admitting: Surgery

## 2024-01-29 VITALS — BP 135/78 | HR 66 | Ht 64.0 in | Wt 242.0 lb

## 2024-01-29 DIAGNOSIS — K432 Incisional hernia without obstruction or gangrene: Secondary | ICD-10-CM

## 2024-01-29 DIAGNOSIS — K828 Other specified diseases of gallbladder: Secondary | ICD-10-CM

## 2024-01-29 DIAGNOSIS — Z8776 Personal history of (corrected) congenital diaphragmatic hernia or other congenital diaphragm malformations: Secondary | ICD-10-CM

## 2024-01-29 NOTE — Progress Notes (Signed)
 GENERAL [facility]GERY, [facility]  [address]  [place] [address]    History and Physical    Name: Dawn Riley MRN:  [id number]   Date: 01/29/2024 DOB:  Jan 03, 1956 (68 y.o.)           Follow Up    Referring Provider: No ref. provider found     Chief Complaint:  Follow Up 4 Months (LOV 10/02/23/)       HPI:  This is a follow-up visit.  Patient has dysfunctional gallbladder but sonogram did not show any stones.  No history suggestive of jaundice.  She has pretty much controlling the symptoms with diet.  She claimed that when she does eat greasy or fried food she gets some heaviness in the right upper quadrant which goes away after some time but she gets no nausea or vomiting and no severe pain and and does not want any intervention unless if it is absolutely necessary.    She also has a ventral hernia which is recurrent because she has a repaired in 2019 without any mesh.  A CT scan was ordered and I have reviewed the images she has a very small defect with some fat incarcerated through this.  Patient does not get any crampy abdominal pain does not get any abdominal pain except when palpating directly over it and no bowel problems.    Past Medical History:  Past Medical History:   Diagnosis Date    Leaky heart valve     OSA (obstructive sleep apnea)          Past Surgical History:   Procedure Laterality Date    COLONOSCOPY      HX CARPAL TUNNEL RELEASE Left     HX HERNIA REPAIR      HX KNEE REPLACMENT      Bilateral         Family Medical History:    None          Social History     Tobacco Use    Smoking status: Never     Passive exposure: Never    Smokeless tobacco: Never   Vaping Use    Vaping status: Never Used   Substance Use Topics    Alcohol use: Never        Allergies:  Allergies   Allergen Reactions    Sulfa (Sulfonamides)      Other Reaction(s): other    Sulfamethoxazole  Other Adverse Reaction (Add comment)     Patient had numbness in her extremities    Azelastine  Mental Status Effect     Felt  anxious and could not sleep.        Medications:  Current Outpatient Medications   Medication Sig    ALPRAZolam (XANAX) 1 mg Oral Tablet Take 1 tablet twice a day by oral route for 30 days.    ammonium lactate (GERI-HYDROLAC) 12 % Cream Apply topically Twice daily    cetirizine  (ZYRTEC ) 10 mg Oral Tablet Take 1 Tablet (10 mg total) by mouth Every evening    cholecalciferol, vitamin D3, 1,250 mcg (50,000 unit) Oral Capsule Take 1 Capsule (50,000 Units total) by mouth Every 7 days    ciclopirox (PENLAC) 8 % Solution Apply topically Daily    Fluocinolone  Acetonide Oil (DERMOTIC  OIL) 0.01 % Otic Drops 4 gtts AU QHS PRN    meloxicam (MOBIC) 15 mg Oral Tablet Take 1 Tablet (15 mg total) by mouth Daily    Triamcinolone  Acetonide (NASACORT ) 55 mcg Nasal Aerosol,  Spray Administer 2 Sprays into affected nostril(s) Daily        Physical Exam:  Patient is fully awake and alert and fully oriented and in no distress.  She is overweight.  Lungs are clear   Heart sounds normal   Abdomen is large protuberant with a small non reducible lump under the umbilicus she complains some tenderness on palpation otherwise abdomen is benign.  There was no right upper quadrant tenderness.    Previous records CT images reviewed  Assessment and Plan:    ICD-10-CM    1. Dysfunctional gallbladder  K82.8       2. Recurrent ventral hernia  K43.2         Patient will continue to manage the dysfunctional gallbladder with a diet and she does not want any surgical intervention unless it is absolutely necessary if the symptoms change she will contact me sooner otherwise return in 3 months.      Detailed discussion done with the her about the in the hernia and considering her body habitus there will be a high chance of recurrence.  She was advised to lose weight.  It is a very small defect with some incarcerated fat in the risk of strangulation is minimal in my opinion.  Again she will be re-evaluated in 3 months or sooner if needed.  Follow Up:  No  follow-ups on file.    Elna Haggis, MD

## 2024-02-09 ENCOUNTER — Other Ambulatory Visit: Payer: Self-pay

## 2024-02-09 ENCOUNTER — Ambulatory Visit
Admission: RE | Admit: 2024-02-09 | Discharge: 2024-02-09 | Disposition: A | Discharge status: Home / Self Care | Source: Ambulatory Visit | Attending: NURSE PRACTITIONER | Admitting: NURSE PRACTITIONER

## 2024-02-09 ENCOUNTER — Other Ambulatory Visit (HOSPITAL_COMMUNITY): Payer: Self-pay | Admitting: NURSE PRACTITIONER

## 2024-02-09 DIAGNOSIS — M25512 Pain in left shoulder: Secondary | ICD-10-CM

## 2024-02-09 DIAGNOSIS — M25511 Pain in right shoulder: Secondary | ICD-10-CM | POA: Insufficient documentation

## 2024-02-09 DIAGNOSIS — M19011 Primary osteoarthritis, right shoulder: Secondary | ICD-10-CM

## 2024-02-09 DIAGNOSIS — M19012 Primary osteoarthritis, left shoulder: Secondary | ICD-10-CM

## 2024-02-10 DIAGNOSIS — Z1211 Encounter for screening for malignant neoplasm of colon: Secondary | ICD-10-CM | POA: Insufficient documentation

## 2024-02-11 ENCOUNTER — Other Ambulatory Visit (HOSPITAL_COMMUNITY): Payer: Self-pay | Admitting: NURSE PRACTITIONER

## 2024-02-11 DIAGNOSIS — Z13820 Encounter for screening for osteoporosis: Secondary | ICD-10-CM

## 2024-02-13 DIAGNOSIS — D229 Melanocytic nevi, unspecified: Secondary | ICD-10-CM | POA: Insufficient documentation

## 2024-02-13 DIAGNOSIS — M75100 Unspecified rotator cuff tear or rupture of unspecified shoulder, not specified as traumatic: Secondary | ICD-10-CM | POA: Insufficient documentation

## 2024-02-13 DIAGNOSIS — I2510 Atherosclerotic heart disease of native coronary artery without angina pectoris: Secondary | ICD-10-CM | POA: Insufficient documentation

## 2024-02-14 ENCOUNTER — Other Ambulatory Visit: Payer: Self-pay

## 2024-02-14 ENCOUNTER — Ambulatory Visit
Admission: RE | Admit: 2024-02-14 | Discharge: 2024-02-14 | Disposition: A | Discharge status: Home / Self Care | Payer: Self-pay | Source: Ambulatory Visit | Attending: NURSE PRACTITIONER | Admitting: NURSE PRACTITIONER

## 2024-02-14 DIAGNOSIS — I517 Cardiomegaly: Secondary | ICD-10-CM

## 2024-02-14 DIAGNOSIS — R55 Syncope and collapse: Secondary | ICD-10-CM | POA: Insufficient documentation

## 2024-02-14 DIAGNOSIS — I5189 Other ill-defined heart diseases: Secondary | ICD-10-CM

## 2024-02-14 LAB — TRANSTHORACIC ECHOCARDIOGRAM - ADULT: EF VISUAL ESTIMATE: 65

## 2024-02-25 ENCOUNTER — Ambulatory Visit (HOSPITAL_COMMUNITY)

## 2024-02-25 ENCOUNTER — Other Ambulatory Visit (HOSPITAL_COMMUNITY): Payer: Self-pay | Admitting: NURSE PRACTITIONER

## 2024-02-25 DIAGNOSIS — M75101 Unspecified rotator cuff tear or rupture of right shoulder, not specified as traumatic: Secondary | ICD-10-CM

## 2024-02-26 ENCOUNTER — Ambulatory Visit (HOSPITAL_COMMUNITY)

## 2024-02-26 ENCOUNTER — Ambulatory Visit
Admission: RE | Admit: 2024-02-26 | Discharge: 2024-02-26 | Disposition: A | Discharge status: Home / Self Care | Source: Ambulatory Visit | Attending: NURSE PRACTITIONER | Admitting: NURSE PRACTITIONER

## 2024-02-26 ENCOUNTER — Other Ambulatory Visit: Payer: Self-pay

## 2024-02-26 DIAGNOSIS — M8588 Other specified disorders of bone density and structure, other site: Secondary | ICD-10-CM

## 2024-02-26 DIAGNOSIS — Z13820 Encounter for screening for osteoporosis: Secondary | ICD-10-CM | POA: Insufficient documentation

## 2024-02-27 ENCOUNTER — Ambulatory Visit (HOSPITAL_COMMUNITY)

## 2024-03-05 ENCOUNTER — Other Ambulatory Visit: Payer: Self-pay

## 2024-03-05 ENCOUNTER — Ambulatory Visit
Admission: RE | Admit: 2024-03-05 | Discharge: 2024-03-05 | Disposition: A | Discharge status: Home / Self Care | Source: Ambulatory Visit | Attending: NURSE PRACTITIONER | Admitting: NURSE PRACTITIONER

## 2024-03-05 DIAGNOSIS — M75101 Unspecified rotator cuff tear or rupture of right shoulder, not specified as traumatic: Secondary | ICD-10-CM | POA: Insufficient documentation

## 2024-03-06 DIAGNOSIS — M25411 Effusion, right shoulder: Secondary | ICD-10-CM

## 2024-03-25 ENCOUNTER — Encounter (INDEPENDENT_AMBULATORY_CARE_PROVIDER_SITE_OTHER): Payer: Self-pay

## 2024-03-25 ENCOUNTER — Other Ambulatory Visit (INDEPENDENT_AMBULATORY_CARE_PROVIDER_SITE_OTHER): Payer: Self-pay

## 2024-03-26 ENCOUNTER — Ambulatory Visit (HOSPITAL_COMMUNITY)

## 2024-04-29 ENCOUNTER — Encounter (INDEPENDENT_AMBULATORY_CARE_PROVIDER_SITE_OTHER): Payer: Self-pay | Admitting: Surgery

## 2024-05-06 ENCOUNTER — Encounter (INDEPENDENT_AMBULATORY_CARE_PROVIDER_SITE_OTHER): Admitting: Surgery

## 2024-05-13 ENCOUNTER — Other Ambulatory Visit: Payer: Self-pay

## 2024-05-13 ENCOUNTER — Ambulatory Visit (INDEPENDENT_AMBULATORY_CARE_PROVIDER_SITE_OTHER): Admitting: Surgery

## 2024-05-13 ENCOUNTER — Encounter (INDEPENDENT_AMBULATORY_CARE_PROVIDER_SITE_OTHER): Payer: Self-pay | Admitting: Surgery

## 2024-05-13 VITALS — BP 119/57 | HR 59 | Resp 18 | Ht 64.0 in | Wt 228.5 lb

## 2024-05-13 DIAGNOSIS — K432 Incisional hernia without obstruction or gangrene: Secondary | ICD-10-CM

## 2024-05-13 DIAGNOSIS — K828 Other specified diseases of gallbladder: Secondary | ICD-10-CM

## 2024-05-13 NOTE — Progress Notes (Signed)
 [facility]AL [facility]  [address]  [place] [address]    History and Physical    Name: Dawn Riley MRN:  [id number]   Date: 05/13/2024 DOB:  Mar 03, 1956 (68 y.o.)           Follow Up    Referring Provider: No ref. provider found     Chief Complaint:  General (Follow up on RUQ and  gallbladder,  also follow up on ventral hernia)       HPI:  This is a follow-up visit.  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  She does not have any acid reflux symptoms and with the avoidance of greasy food she is completely asymptomatic.  She claimed that sometimes she has tried piece that does not cause any problem.  There are no GI symptoms at this point.    She also has a small ventral hernia and has a had previous repair without any mesh.  She does have some discomfort in the that area near the umbilicus.  She does not have any crampy abdominal pain or other GI symptoms to suggest any bowel obstruction.    I have reviewed the previous records including GB sonogram and HIDA scan report and also reviewed CT images that shows a very small ventral hernia with a fascial defect is less than 1 cm and that has some incarcerated fat present within it.    Past Medical History:  Past Medical History:   Diagnosis Date    Leaky heart valve     OSA (obstructive sleep apnea)          Past Surgical History:   Procedure Laterality Date    COLONOSCOPY      HX CARPAL TUNNEL RELEASE Left     HX HERNIA REPAIR      HX KNEE REPLACMENT      Bilateral         Family Medical History:    None          Social History[1]     Allergies:  Allergies[2]     Medications:  Current Outpatient Medications   Medication Sig    ALPRAZolam (XANAX) 1 mg Oral Tablet Take 1 tablet twice a day by oral route for 30 days.    ammonium lactate (GERI-HYDROLAC) 12 % Cream Apply topically Twice daily    ammonium  lactate (LAC-HYDRIN) 12 % Lotion APPLY A SMALL AMOUNT TOPICALLY ONCE DAILY    busPIRone (BUSPAR) 5 mg Oral Tablet Take 1 Tablet (5 mg total) by mouth Twice daily    celecoxib (CELEBREX) 100 mg Oral Capsule Take 1 Capsule (100 mg total) by mouth Daily    cetirizine  (ZYRTEC ) 10 mg Oral Tablet Take 1 Tablet (10 mg total) by mouth Every evening    cholecalciferol, vitamin D3, 1,250 mcg (50,000 unit) Oral Capsule Take 1 Capsule (50,000 Units total) by mouth Every 7 days    ciclopirox (PENLAC) 8 % Solution Apply topically Daily    clobetasoL (TEMOVATE) 0.05 % Cream APPLY 2 GRAMS TOPICALLY TO LOWER LEGS TWICE DAILY FOR 2 WEEKS THEN DECREASE TO EVERY OTHER DAY. DO NOT APPLY TO FACE OR GROIN    Fluocinolone  Acetonide Oil (DERMOTIC  OIL) 0.01 % Otic Drops 4 gtts AU QHS PRN    fluticasone  propionate (FLONASE ) 50 mcg/actuation Nasal Spray, Suspension USE 1 SPRAY(S) IN EACH NOSTRIL ONCE  DAILY AS NEEDED    magnesium oxide (MAG-OX) 400 mg Oral Tablet Take 1 tablet every day by oral route with meal(s).    meloxicam (MOBIC) 15 mg Oral Tablet Take 1 Tablet (15 mg total) by mouth Daily    Triamcinolone  Acetonide (NASACORT ) 55 mcg Nasal Aerosol, Spray Administer 2 Sprays into affected nostril(s) Daily        Physical Exam:  On examination patient is fully awake and alert and fully oriented and in no distress.  Abdomen is soft and benign.  She does not have any right upper quadrant tenderness.  Midline surgical scar is noted with a small palpable ventral hernia with the incarcerated fat with the defect is not palpable.  Patient had minimal discomfort on palpation over the mass.    Previous records including CT scan images reviewed.  Assessment and Plan:    ICD-10-CM    1. Dysfunctional gallbladder  K82.8       2. Recurrent ventral hernia  K43.2         Gallbladder symptoms are well controlled with the dietary discretion and no surgical intervention advised at this point.    She has a minimal discomfort at the ventral hernia site with the  incarcerated fat fascial defect is less than a cm size.  She is trying to lose weight.    She was offered follow-up with the 1 of the other surgeons for these issues patient claimed that she will follow-up with the PCP and get a referral if needed.  Follow Up:  No follow-ups on file.    CLIFFTON GALLOWAY, MD                 [1]   Social History  Tobacco Use    Smoking status: Never     Passive exposure: Never    Smokeless tobacco: Never   Vaping Use    Vaping status: Never Used   Substance Use Topics    Alcohol use: Never    Drug use: Never   [2]   Allergies  Allergen Reactions    Sulfa (Sulfonamides)      Other Reaction(s): other    Substance with sulfonamide structure and antibacterial mechanism of action (substance)    Sulfamethoxazole  Other Adverse Reaction (Add comment)     Patient had numbness in her extremities    Azelastine  Mental Status Effect     Felt anxious and could not sleep.

## 2024-05-26 ENCOUNTER — Ambulatory Visit (INDEPENDENT_AMBULATORY_CARE_PROVIDER_SITE_OTHER): Payer: Self-pay | Admitting: Surgery

## 2024-06-09 ENCOUNTER — Ambulatory Visit (INDEPENDENT_AMBULATORY_CARE_PROVIDER_SITE_OTHER): Payer: Self-pay | Admitting: Surgery

## 2024-06-09 ENCOUNTER — Encounter (INDEPENDENT_AMBULATORY_CARE_PROVIDER_SITE_OTHER): Payer: Self-pay | Admitting: Surgery

## 2024-06-09 ENCOUNTER — Other Ambulatory Visit: Payer: Self-pay

## 2024-06-09 VITALS — BP 130/60 | HR 65 | Temp 98.0°F | Ht 64.0 in | Wt 236.0 lb

## 2024-06-09 DIAGNOSIS — K828 Other specified diseases of gallbladder: Secondary | ICD-10-CM

## 2024-06-21 ENCOUNTER — Encounter (INDEPENDENT_AMBULATORY_CARE_PROVIDER_SITE_OTHER): Payer: Self-pay | Admitting: Surgery

## 2024-06-21 NOTE — Progress Notes (Signed)
 GENERAL SURGERY, [facility]  [address]  [place] [address]    History and Physical    Name: Dawn Riley MRN:  [id number]   Date: 06/09/2024 DOB:  1955-09-14 (68 y.o.)              Reason for Visit: Gallbladder    History of Present Illness  Dawn Riley presents today for evaluation for gallbladder dysfunction. She has been seeing Dr. CHANETA Galloway who has been following her for the gallbladder dysfunction. Her GB EF on 07/31/23 was 16%. The patient has not had any problems with post prandial nausea, vomiting or abdominal pain. She denies any food intolerance.    Negative DM      Review of the result(s) of each unique test:  Patient underwent diagnostic testing ( as above ) prior to this dates visit.  I have personally reviewed the results and that serves as a component of the medical decision making for this encounter       Review of prior external note(s) from each unique source:  Patients referral to this office including a recent assessment by the referring provider.  This was reviewed by me for this unique office visit for the indication and intent of the referral as well as any pertinent medical or surgical history relevant to the patients independent evaluation by me today.      Patient History  Past Medical History:   Diagnosis Date    Leaky heart valve     OSA (obstructive sleep apnea)     Venous insufficiency          Past Surgical History:   Procedure Laterality Date    COLONOSCOPY      HX CARPAL TUNNEL RELEASE Left     HX HERNIA REPAIR      HX KNEE REPLACMENT      Bilateral         Current Outpatient Medications   Medication Sig    ALPRAZolam (XANAX) 1 mg Oral Tablet Take 1 tablet twice a day by oral route for 30 days.    ammonium lactate (GERI-HYDROLAC) 12 % Cream Apply topically Twice daily    ammonium lactate (LAC-HYDRIN) 12 % Lotion APPLY A SMALL AMOUNT TOPICALLY ONCE DAILY    busPIRone (BUSPAR) 5 mg Oral Tablet Take 1 Tablet (5 mg total) by mouth Twice daily     celecoxib (CELEBREX) 100 mg Oral Capsule Take 1 Capsule (100 mg total) by mouth Daily    cetirizine  (ZYRTEC ) 10 mg Oral Tablet Take 1 Tablet (10 mg total) by mouth Every evening    cholecalciferol, vitamin D3, 1,250 mcg (50,000 unit) Oral Capsule Take 1 Capsule (50,000 Units total) by mouth Every 7 days    ciclopirox (PENLAC) 8 % Solution Apply topically Daily    clobetasoL (TEMOVATE) 0.05 % Cream APPLY 2 GRAMS TOPICALLY TO LOWER LEGS TWICE DAILY FOR 2 WEEKS THEN DECREASE TO EVERY OTHER DAY. DO NOT APPLY TO FACE OR GROIN    Fluocinolone  Acetonide Oil (DERMOTIC  OIL) 0.01 % Otic Drops 4 gtts AU QHS PRN    fluticasone  propionate (FLONASE ) 50 mcg/actuation Nasal Spray, Suspension USE 1 SPRAY(S) IN EACH NOSTRIL ONCE DAILY AS NEEDED    magnesium oxide (MAG-OX) 400 mg Oral Tablet Take 1 tablet every day by oral route with meal(s).    meloxicam (MOBIC) 15 mg Oral Tablet Take 1 Tablet (15 mg total) by mouth Daily    Triamcinolone  Acetonide (NASACORT ) 55 mcg Nasal Aerosol, Spray  Administer 2 Sprays into affected nostril(s) Daily     Allergies[1]  Family Medical History:    None         Social History[2]         Physical Examination:  Vitals:    06/09/24 1605   BP: 130/60   Pulse: 65   Temp: 36.7 C (98 F)   SpO2: 95%   Weight: 107 kg (236 lb)   Height: 1.626 m (5' 4)   BMI: 40.51        General: appropriate for age. in no acute distress.    Vital signs are present above and have been reviewed by me     HEENT: Atraumatic, Normocephalic. PERRLA. EOMI. Nose clear. Throat clear    Lungs: Nonlabored breathing with symmetric expansion. Clear to auscultation bilaterally    Heart:Regular wth respect to rate and rythmn.    Abdomen:Soft. Nontender. Nondistended and benign    Extremities: Grossly normal. No major deformities     Neuro:  Grossly normal motor and sensory function    Psychiatric: Alert and oriented to person, place, and time. affect appropriate      Assessment and Plan  Since the patient is asymptomatic from a  gallbladder standpoint, she has chosen to take a watchful waiting approach. I discussed with the patient the signs/symptoms of gallbladder problems and she will notify me if she has any such problems in the future.      Follow Up:  Return if symptoms worsen or fail to improve.      ICD-10-CM    1. Dysfunctional gallbladder  K82.8           Domanik Rainville B Nitika Jackowski, MD ,MBA,FACS    I appreciate the opportunity to be involved in the care of your patients.  If you have any questions or concerns regarding this encounter, please do not hesitate to contact me at your convenience.      This note may have been partially generated using MModal Fluency Direct system, and there may be some incorrect words, spellings, and punctuation that were not noted in checking the note before saving, though effort was made to avoid such errors.               [1]   Allergies  Allergen Reactions    Sulfa (Sulfonamides)      Other Reaction(s): other    Substance with sulfonamide structure and antibacterial mechanism of action (substance)    Sulfamethoxazole  Other Adverse Reaction (Add comment)     Patient had numbness in her extremities    Azelastine  Mental Status Effect     Felt anxious and could not sleep.   [2]   Social History  Tobacco Use    Smoking status: Never     Passive exposure: Never    Smokeless tobacco: Never   Vaping Use    Vaping status: Never Used   Substance Use Topics    Alcohol use: Never    Drug use: Never

## 2024-08-18 ENCOUNTER — Other Ambulatory Visit (HOSPITAL_COMMUNITY): Payer: Self-pay | Admitting: Internal Medicine

## 2024-08-18 DIAGNOSIS — R9431 Abnormal electrocardiogram [ECG] [EKG]: Secondary | ICD-10-CM

## 2024-08-18 DIAGNOSIS — I10 Essential (primary) hypertension: Secondary | ICD-10-CM

## 2024-08-18 DIAGNOSIS — R079 Chest pain, unspecified: Secondary | ICD-10-CM

## 2024-08-19 ENCOUNTER — Encounter (HOSPITAL_COMMUNITY): Payer: Self-pay

## 2024-09-01 ENCOUNTER — Ambulatory Visit: Payer: Self-pay | Attending: OTOLARYNGOLOGY | Admitting: OTOLARYNGOLOGY

## 2024-09-01 ENCOUNTER — Other Ambulatory Visit: Payer: Self-pay

## 2024-09-01 ENCOUNTER — Encounter (INDEPENDENT_AMBULATORY_CARE_PROVIDER_SITE_OTHER): Payer: Self-pay | Admitting: OTOLARYNGOLOGY

## 2024-09-01 VITALS — Ht 64.0 in | Wt 236.0 lb

## 2024-09-01 DIAGNOSIS — H6123 Impacted cerumen, bilateral: Secondary | ICD-10-CM | POA: Insufficient documentation

## 2024-09-01 DIAGNOSIS — R42 Dizziness and giddiness: Secondary | ICD-10-CM | POA: Insufficient documentation

## 2024-09-01 DIAGNOSIS — H9313 Tinnitus, bilateral: Secondary | ICD-10-CM | POA: Insufficient documentation

## 2024-09-01 DIAGNOSIS — H6693 Otitis media, unspecified, bilateral: Secondary | ICD-10-CM

## 2024-09-01 DIAGNOSIS — J309 Allergic rhinitis, unspecified: Secondary | ICD-10-CM | POA: Insufficient documentation

## 2024-09-01 DIAGNOSIS — H6993 Unspecified Eustachian tube disorder, bilateral: Secondary | ICD-10-CM | POA: Insufficient documentation

## 2024-09-01 NOTE — Addendum Note (Signed)
 Addended by: MARGEAN ANES on: 09/01/2024 10:01 AM     Modules accepted: Orders

## 2024-09-01 NOTE — H&P (Signed)
 ENT, [facility]  [address]  [place] [address]  Operated by [facility]  Progress Note    Name: Dawn Riley MRN:  [id number]   Date: 09/01/2024 DOB:  1955-10-28 (68 y.o.)              Follow Up      Subjective:   Chief Complaint:   Ear Problem(s) (Complains of thumping sound in right ear. Also states concern for wax in both ears. )       History of Present Illness:  Dawn Riley is a 68 y.o. old female who presents to the clinic for follow-up. Patient states that she has been having tinnitus worse in the right ear. She states she may have some wax. Allergy symptoms have been well controlled. She also states she thinks she has been having decreased hearing .    Review of Systems     Physical Exam:     Vitals:    09/01/24 0945   Weight: 107 kg (236 lb)   Height: 1.626 m (5' 4)   BMI: 40.51      ENT Physical Exam  Constitutional  Appearance: patient appears well-developed, well-nourished and well-groomed,  Communication/Voice: communication appropriate for developmental age; vocal quality normal;  Head and Face  Appearance: head appears normal, face appears normal and face appears atraumatic;  Palpation: facial palpation normal;  Salivary: glands normal;  Ear  Hearing: intact;  Auricles: right auricle normal; left auricle normal;  External Mastoids: right external mastoid normal; left external mastoid normal;  Ear Canals: right ear canal normal; left ear canal normal;  Tympanic Membranes: right tympanic membrane normal; left tympanic membrane normal;  Nose  External Nose: nares patent bilaterally; external nose normal;  Internal Nose: nasal mucosa normal; nasal septal deviation present; bilateral inferior turbinates with hypertrophy;  Oral Cavity/Oropharynx  Lips: normal;  Teeth: normal;  Gums: gingiva normal;  Tongue: normal;  Oral mucosa: normal;  Hard palate: normal;  Neck  Neck: neck normal; neck palpation normal;  Thyroid: thyroid normal;  Respiratory  Inspection: breathing  unlabored; normal breathing rate;  Lymphatic  Palpation: lymph nodes normal;  Neurovestibular  Mental Status: alert and oriented;  Psychiatric: mood normal; affect is appropriate;  Cranial Nerves: cranial nerves intact;       Assessment and Plan:       ICD-10-CM    1. Tinnitus of both ears  H93.13 [facility]      2. Chronic allergic rhinitis  J30.9       3. Dizziness and giddiness  R42       4. ETD (Eustachian tube dysfunction), bilateral  H69.93 POCT HEARING/VISION/TYMPANOGRAM (AMB ONLY)      5. Bilateral impacted cerumen  H61.23         Orders Placed This Encounter    [facility]    POCT HEARING/VISION/TYMPANOGRAM (AMB ONLY)   Tymps type A Au  Continue nasacort  and zyrtec  chronic allergic rhinitis.  Will check audiogram  Cerumen removed.    Follow up:  Return for Follow up for audiogram.    Dawn Arch, DO

## 2024-09-01 NOTE — Procedures (Signed)
 E[facility], [facility]  [address]  [place] [address]  Operated by [facility]  Procedure Note    Name: Dawn Riley MRN:  [id number]   Date: 09/01/2024 DOB:  1956-06-20 (68 y.o.)         [phone] - REMOVAL IMPACTED CERUMEN W/ INSTRUMENT, UNILATERAL (AMB ONLY-PD)    Performed by: Margean Anes, DO  Authorized by: Margean Anes, DO    Time Out:     Immediately before the procedure, a time out was called:  Yes    Patient verified:  Yes    Procedure Verified:  Yes    Site Verified:  Yes  Documentation:      Procedure: Cerumen cleaning  Pre-op Dx: Cerumen impaction      Bilateral EAC(s) examined under binocular microscopy.  Cerumen and/or debris was cleaned from the canal(s) using curettes, suction, and alligator forceps.  Patient tolerated procedure well.       Anes Margean, DO

## 2024-10-03 ENCOUNTER — Other Ambulatory Visit (INDEPENDENT_AMBULATORY_CARE_PROVIDER_SITE_OTHER): Payer: Self-pay | Admitting: OTOLARYNGOLOGY

## 2024-10-08 ENCOUNTER — Ambulatory Visit (HOSPITAL_COMMUNITY)

## 2024-10-08 ENCOUNTER — Ambulatory Visit

## 2024-12-10 ENCOUNTER — Ambulatory Visit (INDEPENDENT_AMBULATORY_CARE_PROVIDER_SITE_OTHER): Payer: Self-pay | Admitting: OTOLARYNGOLOGY
